# Patient Record
Sex: Male | Born: 1935
Health system: Southern US, Community
[De-identification: ages and names within clinical notes are randomized; demographics above are authoritative.]

## PROBLEM LIST (undated history)

## (undated) DIAGNOSIS — N400 Enlarged prostate without lower urinary tract symptoms: Secondary | ICD-10-CM

## (undated) DIAGNOSIS — I700 Atherosclerosis of aorta: Secondary | ICD-10-CM

## (undated) DIAGNOSIS — Q433 Congenital malformations of intestinal fixation: Secondary | ICD-10-CM

## (undated) DIAGNOSIS — K4020 Bilateral inguinal hernia, without obstruction or gangrene, not specified as recurrent: Secondary | ICD-10-CM

## (undated) DIAGNOSIS — I219 Acute myocardial infarction, unspecified: Secondary | ICD-10-CM

## (undated) DIAGNOSIS — I4821 Permanent atrial fibrillation: Secondary | ICD-10-CM

## (undated) DIAGNOSIS — I10 Essential (primary) hypertension: Secondary | ICD-10-CM

## (undated) DIAGNOSIS — Z7901 Long term (current) use of anticoagulants: Secondary | ICD-10-CM

## (undated) DIAGNOSIS — K5730 Diverticulosis of large intestine without perforation or abscess without bleeding: Secondary | ICD-10-CM

## (undated) DIAGNOSIS — Z9841 Cataract extraction status, right eye: Secondary | ICD-10-CM

## (undated) DIAGNOSIS — G709 Myoneural disorder, unspecified: Secondary | ICD-10-CM

## (undated) DIAGNOSIS — E079 Disorder of thyroid, unspecified: Secondary | ICD-10-CM

## (undated) DIAGNOSIS — K8020 Calculus of gallbladder without cholecystitis without obstruction: Secondary | ICD-10-CM

## (undated) DIAGNOSIS — D494 Neoplasm of unspecified behavior of bladder: Secondary | ICD-10-CM

## (undated) DIAGNOSIS — I2510 Atherosclerotic heart disease of native coronary artery without angina pectoris: Secondary | ICD-10-CM

## (undated) DIAGNOSIS — R7303 Prediabetes: Secondary | ICD-10-CM

## (undated) DIAGNOSIS — Z9842 Cataract extraction status, left eye: Secondary | ICD-10-CM

## (undated) DIAGNOSIS — I517 Cardiomegaly: Secondary | ICD-10-CM

## (undated) DIAGNOSIS — E039 Hypothyroidism, unspecified: Secondary | ICD-10-CM

## (undated) DIAGNOSIS — R600 Localized edema: Secondary | ICD-10-CM

## (undated) DIAGNOSIS — E785 Hyperlipidemia, unspecified: Secondary | ICD-10-CM

## (undated) DIAGNOSIS — I4891 Unspecified atrial fibrillation: Secondary | ICD-10-CM

## (undated) HISTORY — DX: Disorder of thyroid, unspecified: E07.9

## (undated) HISTORY — PX: HERNIA REPAIR: SHX51

## (undated) HISTORY — PX: CATARACT EXTRACTION, BILATERAL: SHX1313

---

## 1993-03-16 HISTORY — PX: CORONARY ARTERY BYPASS GRAFT: SHX141

## 1993-03-16 HISTORY — PX: CARDIAC SURGERY: SHX584

## 1994-06-18 DIAGNOSIS — I2109 ST elevation (STEMI) myocardial infarction involving other coronary artery of anterior wall: Secondary | ICD-10-CM

## 1994-06-18 HISTORY — DX: ST elevation (STEMI) myocardial infarction involving other coronary artery of anterior wall: I21.09

## 1994-06-25 DIAGNOSIS — Z951 Presence of aortocoronary bypass graft: Secondary | ICD-10-CM

## 1994-06-25 HISTORY — DX: Presence of aortocoronary bypass graft: Z95.1

## 1994-06-25 HISTORY — PX: CORONARY ARTERY BYPASS GRAFT: SHX141

## 2004-01-16 ENCOUNTER — Ambulatory Visit: Payer: Self-pay | Admitting: Unknown Physician Specialty

## 2004-01-26 ENCOUNTER — Inpatient Hospital Stay: Payer: Self-pay | Admitting: Internal Medicine

## 2004-01-26 ENCOUNTER — Other Ambulatory Visit: Payer: Self-pay

## 2007-07-26 DIAGNOSIS — C4492 Squamous cell carcinoma of skin, unspecified: Secondary | ICD-10-CM

## 2007-07-26 HISTORY — DX: Squamous cell carcinoma of skin, unspecified: C44.92

## 2008-04-18 ENCOUNTER — Ambulatory Visit: Payer: Self-pay | Admitting: Ophthalmology

## 2008-05-01 ENCOUNTER — Ambulatory Visit: Payer: Self-pay | Admitting: Ophthalmology

## 2008-06-07 ENCOUNTER — Ambulatory Visit: Payer: Self-pay | Admitting: Ophthalmology

## 2008-06-19 ENCOUNTER — Ambulatory Visit: Payer: Self-pay | Admitting: Ophthalmology

## 2011-04-06 ENCOUNTER — Ambulatory Visit: Payer: Self-pay | Admitting: Urology

## 2013-09-09 DIAGNOSIS — I2510 Atherosclerotic heart disease of native coronary artery without angina pectoris: Secondary | ICD-10-CM | POA: Insufficient documentation

## 2013-09-09 DIAGNOSIS — I10 Essential (primary) hypertension: Secondary | ICD-10-CM | POA: Insufficient documentation

## 2013-09-09 DIAGNOSIS — E039 Hypothyroidism, unspecified: Secondary | ICD-10-CM | POA: Insufficient documentation

## 2013-09-09 DIAGNOSIS — I482 Chronic atrial fibrillation, unspecified: Secondary | ICD-10-CM | POA: Insufficient documentation

## 2013-10-17 DIAGNOSIS — E785 Hyperlipidemia, unspecified: Secondary | ICD-10-CM | POA: Insufficient documentation

## 2014-01-04 IMAGING — US US RENAL KIDNEY
1 series · 17 of 25 positions shown · non-contrast
Comparison: none

REASON FOR EXAM: hematuria
COMMENTS:

PROCEDURE:     US  - US KIDNEY  - April 06, 2011  [DATE]
RESULT:

[Series 1: us renal kidney · 17 of 33 slices shown]
[im 1/33]
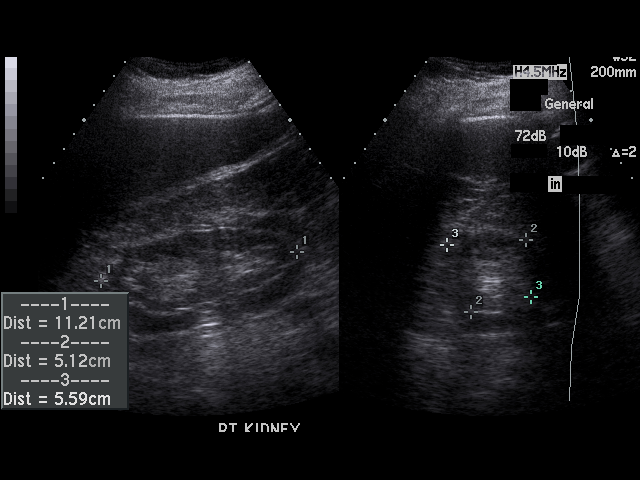
[im 3/33]
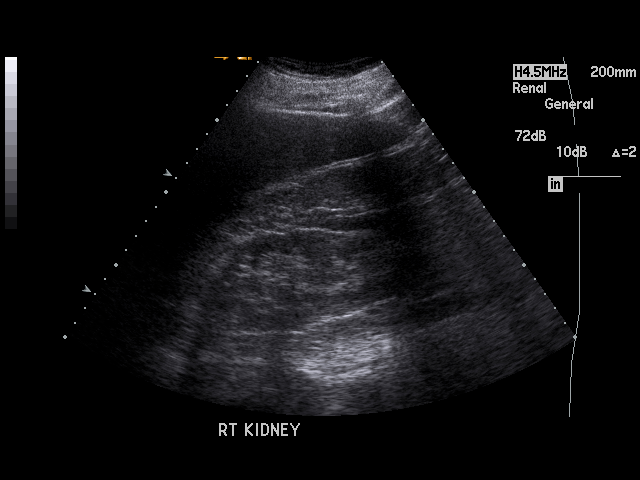
[im 5/33]
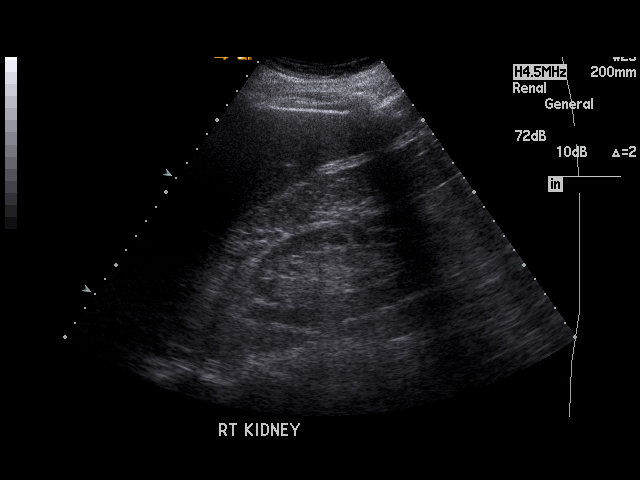
[im 7/33]
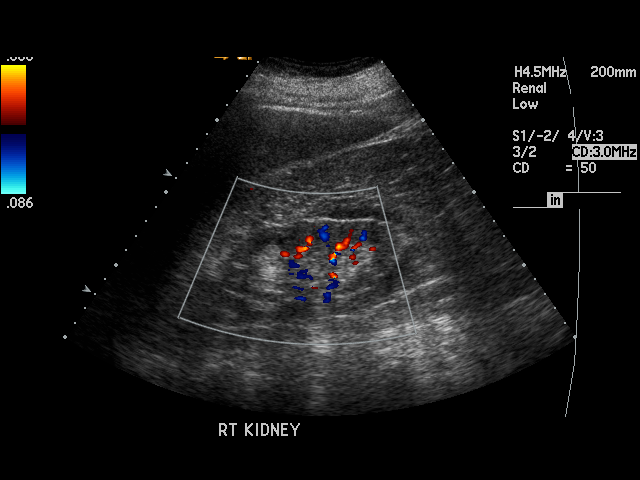
[im 9/33]
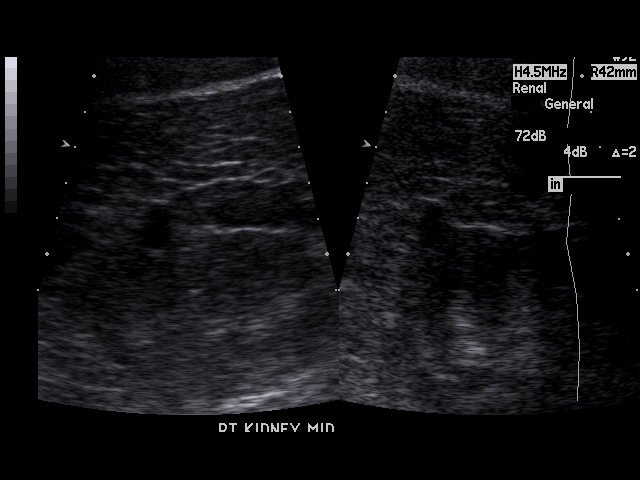
[im 11/33]
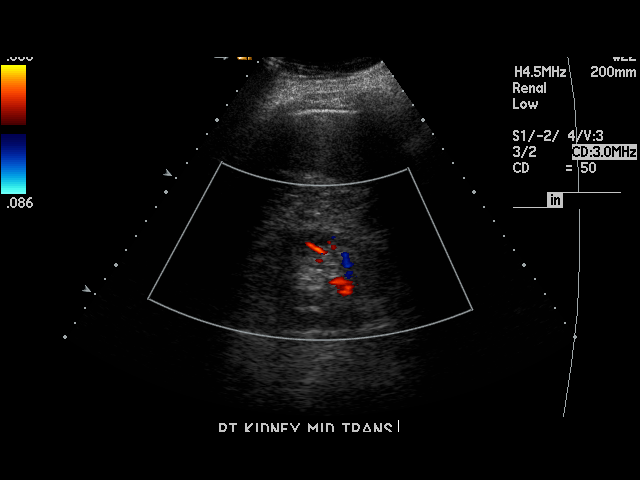
[im 13/33]
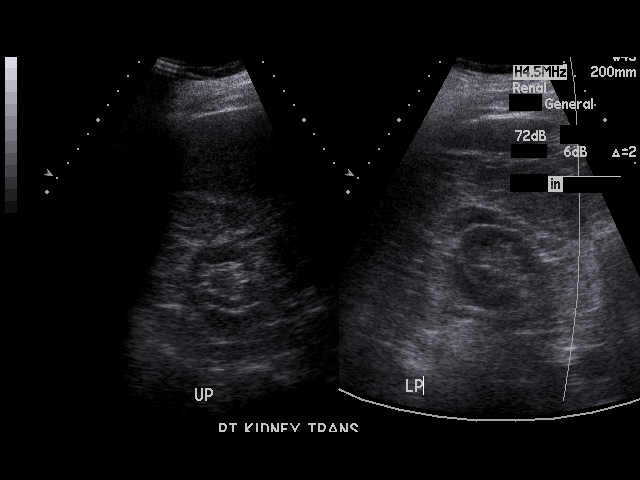
[im 15/33]
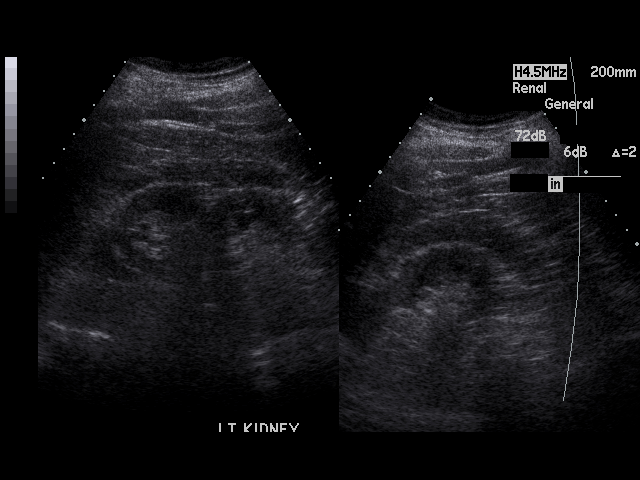
[im 17/33]
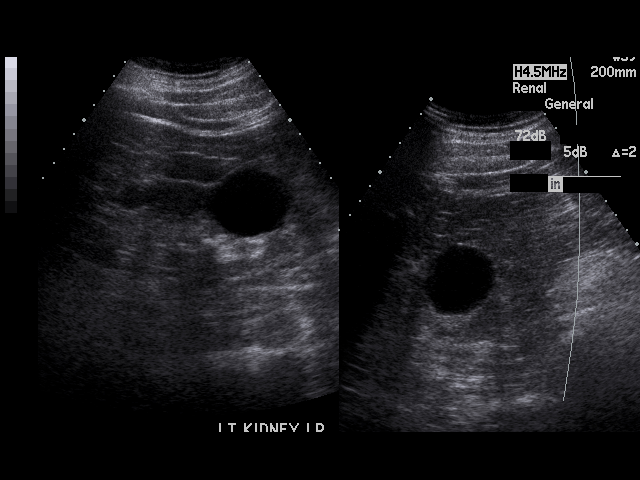
[im 18/33]
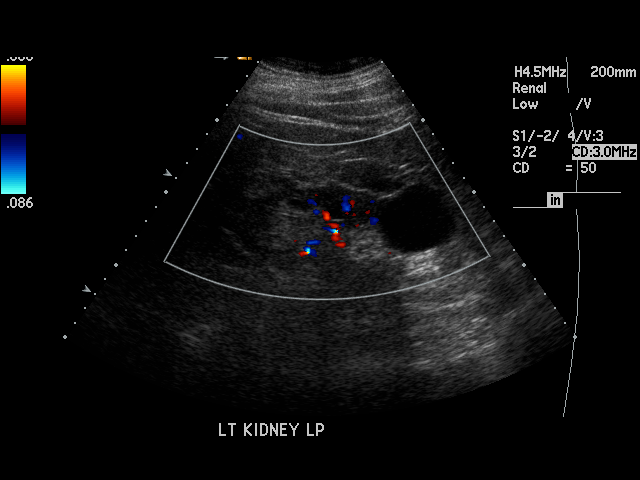
[im 21/33]
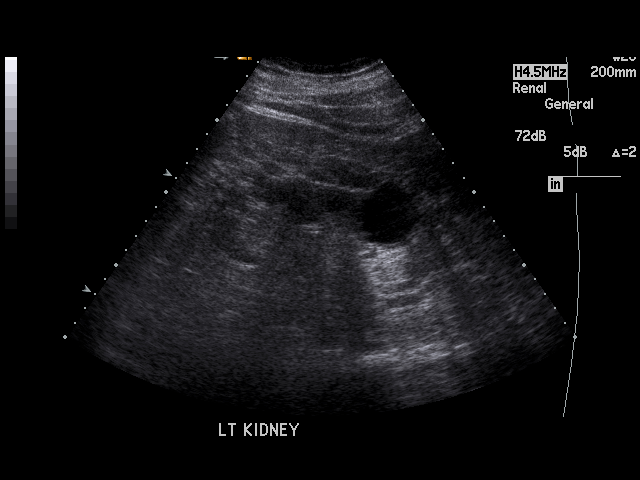
[im 22/33]
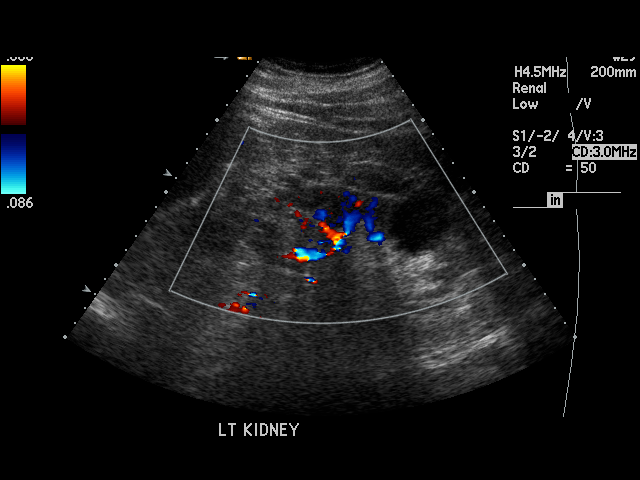
[im 25/33]
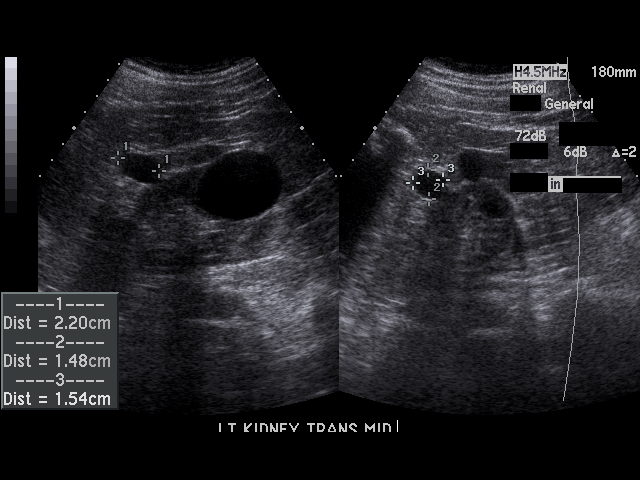
[im 26/33]
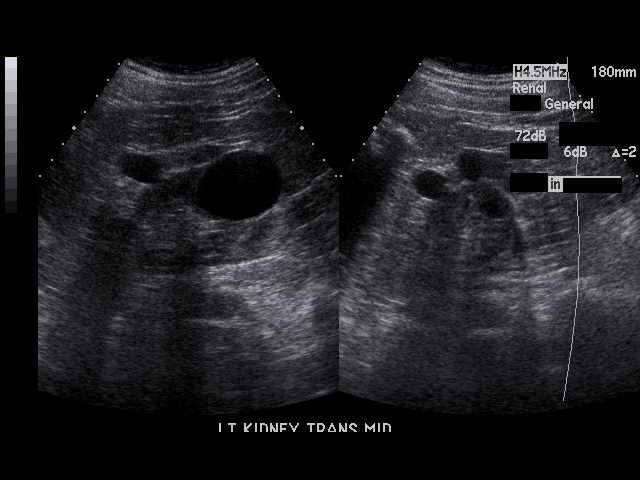
[im 29/33]
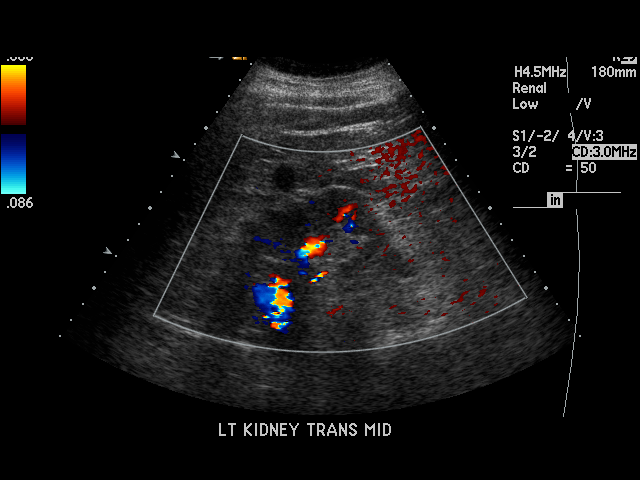
[im 30/33]
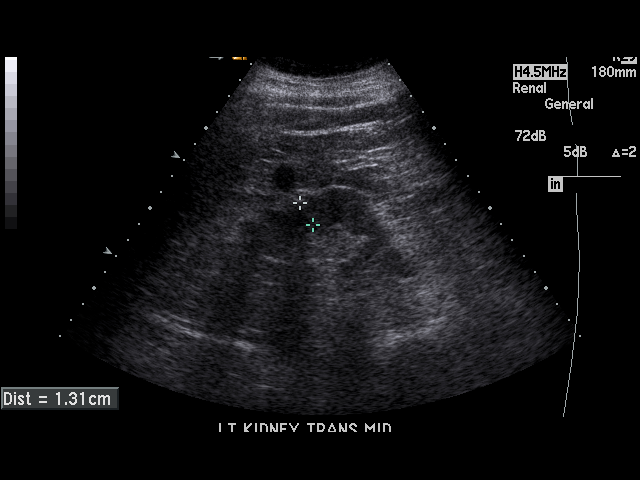
[im 33/33]
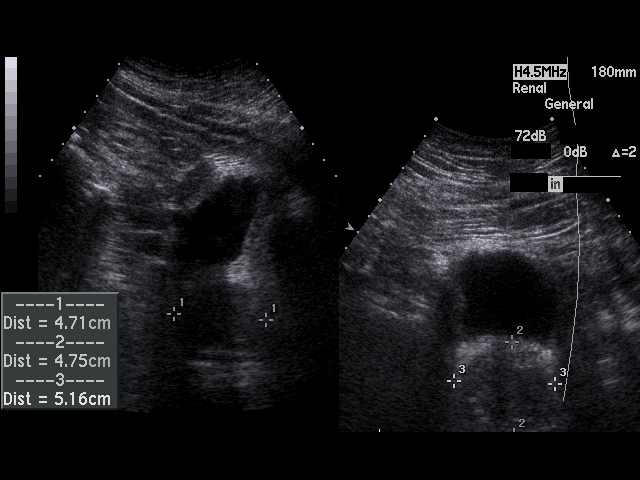

[17 of 25 positions shown; findings below may reference images not displayed]

FINDINGS: The right kidney measures 11.21 x 5.12 x 5.59 cm and the left is
11.03 x 5.89 x 4.35 cm. There is appropriate corticomedullary
differentiation and bilateral mild to moderate cortical thinning. A small
cyst is identified within the mid pole of the right kidney. Cysts are also
identified within the left kidney with the largest measuring 4.47 x 4.02 cm
in the lower pole. There is no evidence of hydronephrosis or solid appearing
masses or calculi. The urinary bladder is partially decompressed. The
prostate is prominent measuring 4.71 x 4.75 x 5.16 cm causing mass effect
upon the base of the bladder.
IMPRESSION: 1.  Bilateral cortical thinning as described above.
2.  Bilateral renal cysts.
3.  No further sonographic abnormalities.

## 2015-03-19 DIAGNOSIS — Z7901 Long term (current) use of anticoagulants: Secondary | ICD-10-CM | POA: Diagnosis not present

## 2015-03-27 DIAGNOSIS — M25572 Pain in left ankle and joints of left foot: Secondary | ICD-10-CM | POA: Diagnosis not present

## 2015-05-09 DIAGNOSIS — Z7901 Long term (current) use of anticoagulants: Secondary | ICD-10-CM | POA: Diagnosis not present

## 2015-05-18 DIAGNOSIS — J111 Influenza due to unidentified influenza virus with other respiratory manifestations: Secondary | ICD-10-CM | POA: Diagnosis not present

## 2015-05-31 DIAGNOSIS — E782 Mixed hyperlipidemia: Secondary | ICD-10-CM | POA: Diagnosis not present

## 2015-05-31 DIAGNOSIS — I4891 Unspecified atrial fibrillation: Secondary | ICD-10-CM | POA: Diagnosis not present

## 2015-05-31 DIAGNOSIS — N183 Chronic kidney disease, stage 3 (moderate): Secondary | ICD-10-CM | POA: Diagnosis not present

## 2015-05-31 DIAGNOSIS — I2510 Atherosclerotic heart disease of native coronary artery without angina pectoris: Secondary | ICD-10-CM | POA: Diagnosis not present

## 2015-05-31 DIAGNOSIS — I482 Chronic atrial fibrillation: Secondary | ICD-10-CM | POA: Diagnosis not present

## 2015-05-31 DIAGNOSIS — I10 Essential (primary) hypertension: Secondary | ICD-10-CM | POA: Diagnosis not present

## 2015-06-05 DIAGNOSIS — M25572 Pain in left ankle and joints of left foot: Secondary | ICD-10-CM | POA: Diagnosis not present

## 2015-06-06 DIAGNOSIS — Z7901 Long term (current) use of anticoagulants: Secondary | ICD-10-CM | POA: Diagnosis not present

## 2015-07-08 DIAGNOSIS — Z7901 Long term (current) use of anticoagulants: Secondary | ICD-10-CM | POA: Diagnosis not present

## 2015-07-25 DIAGNOSIS — L509 Urticaria, unspecified: Secondary | ICD-10-CM | POA: Diagnosis not present

## 2015-07-25 DIAGNOSIS — L570 Actinic keratosis: Secondary | ICD-10-CM | POA: Diagnosis not present

## 2015-07-25 DIAGNOSIS — T07 Unspecified multiple injuries: Secondary | ICD-10-CM | POA: Diagnosis not present

## 2015-07-25 DIAGNOSIS — L820 Inflamed seborrheic keratosis: Secondary | ICD-10-CM | POA: Diagnosis not present

## 2015-07-25 DIAGNOSIS — L578 Other skin changes due to chronic exposure to nonionizing radiation: Secondary | ICD-10-CM | POA: Diagnosis not present

## 2015-08-05 DIAGNOSIS — Z7901 Long term (current) use of anticoagulants: Secondary | ICD-10-CM | POA: Diagnosis not present

## 2015-09-04 DIAGNOSIS — Z7901 Long term (current) use of anticoagulants: Secondary | ICD-10-CM | POA: Diagnosis not present

## 2015-09-04 DIAGNOSIS — I10 Essential (primary) hypertension: Secondary | ICD-10-CM | POA: Diagnosis not present

## 2015-09-04 DIAGNOSIS — E039 Hypothyroidism, unspecified: Secondary | ICD-10-CM | POA: Diagnosis not present

## 2015-09-04 DIAGNOSIS — I2510 Atherosclerotic heart disease of native coronary artery without angina pectoris: Secondary | ICD-10-CM | POA: Diagnosis not present

## 2015-09-11 DIAGNOSIS — I2510 Atherosclerotic heart disease of native coronary artery without angina pectoris: Secondary | ICD-10-CM | POA: Diagnosis not present

## 2015-09-11 DIAGNOSIS — I482 Chronic atrial fibrillation: Secondary | ICD-10-CM | POA: Diagnosis not present

## 2015-09-11 DIAGNOSIS — E039 Hypothyroidism, unspecified: Secondary | ICD-10-CM | POA: Diagnosis not present

## 2015-09-11 DIAGNOSIS — I10 Essential (primary) hypertension: Secondary | ICD-10-CM | POA: Diagnosis not present

## 2015-09-11 DIAGNOSIS — N183 Chronic kidney disease, stage 3 (moderate): Secondary | ICD-10-CM | POA: Diagnosis not present

## 2015-10-02 DIAGNOSIS — Z7901 Long term (current) use of anticoagulants: Secondary | ICD-10-CM | POA: Diagnosis not present

## 2015-10-30 DIAGNOSIS — Z7901 Long term (current) use of anticoagulants: Secondary | ICD-10-CM | POA: Diagnosis not present

## 2015-11-26 DIAGNOSIS — Z23 Encounter for immunization: Secondary | ICD-10-CM | POA: Diagnosis not present

## 2015-12-03 DIAGNOSIS — I10 Essential (primary) hypertension: Secondary | ICD-10-CM | POA: Diagnosis not present

## 2015-12-03 DIAGNOSIS — I2510 Atherosclerotic heart disease of native coronary artery without angina pectoris: Secondary | ICD-10-CM | POA: Diagnosis not present

## 2015-12-03 DIAGNOSIS — E782 Mixed hyperlipidemia: Secondary | ICD-10-CM | POA: Diagnosis not present

## 2015-12-03 DIAGNOSIS — I482 Chronic atrial fibrillation: Secondary | ICD-10-CM | POA: Diagnosis not present

## 2015-12-03 DIAGNOSIS — Z7901 Long term (current) use of anticoagulants: Secondary | ICD-10-CM | POA: Diagnosis not present

## 2015-12-31 DIAGNOSIS — Z7901 Long term (current) use of anticoagulants: Secondary | ICD-10-CM | POA: Diagnosis not present

## 2016-01-05 DIAGNOSIS — S99921A Unspecified injury of right foot, initial encounter: Secondary | ICD-10-CM | POA: Diagnosis not present

## 2016-01-28 DIAGNOSIS — Z7901 Long term (current) use of anticoagulants: Secondary | ICD-10-CM | POA: Diagnosis not present

## 2016-02-10 DIAGNOSIS — D239 Other benign neoplasm of skin, unspecified: Secondary | ICD-10-CM

## 2016-02-10 DIAGNOSIS — L720 Epidermal cyst: Secondary | ICD-10-CM | POA: Diagnosis not present

## 2016-02-10 DIAGNOSIS — Z1283 Encounter for screening for malignant neoplasm of skin: Secondary | ICD-10-CM | POA: Diagnosis not present

## 2016-02-10 DIAGNOSIS — L821 Other seborrheic keratosis: Secondary | ICD-10-CM | POA: Diagnosis not present

## 2016-02-10 DIAGNOSIS — Z85828 Personal history of other malignant neoplasm of skin: Secondary | ICD-10-CM | POA: Diagnosis not present

## 2016-02-10 DIAGNOSIS — D229 Melanocytic nevi, unspecified: Secondary | ICD-10-CM | POA: Diagnosis not present

## 2016-02-10 DIAGNOSIS — L578 Other skin changes due to chronic exposure to nonionizing radiation: Secondary | ICD-10-CM | POA: Diagnosis not present

## 2016-02-10 DIAGNOSIS — D1800 Hemangioma unspecified site: Secondary | ICD-10-CM | POA: Diagnosis not present

## 2016-02-10 DIAGNOSIS — D692 Other nonthrombocytopenic purpura: Secondary | ICD-10-CM | POA: Diagnosis not present

## 2016-02-10 DIAGNOSIS — D485 Neoplasm of uncertain behavior of skin: Secondary | ICD-10-CM | POA: Diagnosis not present

## 2016-02-10 DIAGNOSIS — L570 Actinic keratosis: Secondary | ICD-10-CM | POA: Diagnosis not present

## 2016-02-10 DIAGNOSIS — D225 Melanocytic nevi of trunk: Secondary | ICD-10-CM | POA: Diagnosis not present

## 2016-02-10 HISTORY — DX: Other benign neoplasm of skin, unspecified: D23.9

## 2016-02-25 DIAGNOSIS — D485 Neoplasm of uncertain behavior of skin: Secondary | ICD-10-CM | POA: Diagnosis not present

## 2016-02-25 DIAGNOSIS — L089 Local infection of the skin and subcutaneous tissue, unspecified: Secondary | ICD-10-CM | POA: Diagnosis not present

## 2016-03-11 DIAGNOSIS — I10 Essential (primary) hypertension: Secondary | ICD-10-CM | POA: Diagnosis not present

## 2016-03-11 DIAGNOSIS — Z7901 Long term (current) use of anticoagulants: Secondary | ICD-10-CM | POA: Diagnosis not present

## 2016-03-11 DIAGNOSIS — I2510 Atherosclerotic heart disease of native coronary artery without angina pectoris: Secondary | ICD-10-CM | POA: Diagnosis not present

## 2016-03-11 DIAGNOSIS — E039 Hypothyroidism, unspecified: Secondary | ICD-10-CM | POA: Diagnosis not present

## 2016-03-17 DIAGNOSIS — Z0000 Encounter for general adult medical examination without abnormal findings: Secondary | ICD-10-CM | POA: Diagnosis not present

## 2016-03-17 DIAGNOSIS — I10 Essential (primary) hypertension: Secondary | ICD-10-CM | POA: Diagnosis not present

## 2016-03-17 DIAGNOSIS — I2510 Atherosclerotic heart disease of native coronary artery without angina pectoris: Secondary | ICD-10-CM | POA: Diagnosis not present

## 2016-03-17 DIAGNOSIS — E782 Mixed hyperlipidemia: Secondary | ICD-10-CM | POA: Diagnosis not present

## 2016-03-17 DIAGNOSIS — N183 Chronic kidney disease, stage 3 (moderate): Secondary | ICD-10-CM | POA: Diagnosis not present

## 2016-03-17 DIAGNOSIS — I482 Chronic atrial fibrillation: Secondary | ICD-10-CM | POA: Diagnosis not present

## 2016-03-17 DIAGNOSIS — E039 Hypothyroidism, unspecified: Secondary | ICD-10-CM | POA: Diagnosis not present

## 2016-04-16 DIAGNOSIS — Z7901 Long term (current) use of anticoagulants: Secondary | ICD-10-CM | POA: Diagnosis not present

## 2016-05-13 DIAGNOSIS — Z7901 Long term (current) use of anticoagulants: Secondary | ICD-10-CM | POA: Diagnosis not present

## 2016-05-21 DIAGNOSIS — E782 Mixed hyperlipidemia: Secondary | ICD-10-CM | POA: Diagnosis not present

## 2016-05-21 DIAGNOSIS — N183 Chronic kidney disease, stage 3 (moderate): Secondary | ICD-10-CM | POA: Diagnosis not present

## 2016-05-21 DIAGNOSIS — I482 Chronic atrial fibrillation: Secondary | ICD-10-CM | POA: Diagnosis not present

## 2016-05-21 DIAGNOSIS — I10 Essential (primary) hypertension: Secondary | ICD-10-CM | POA: Diagnosis not present

## 2016-05-21 DIAGNOSIS — I2510 Atherosclerotic heart disease of native coronary artery without angina pectoris: Secondary | ICD-10-CM | POA: Diagnosis not present

## 2016-06-09 DIAGNOSIS — Z7901 Long term (current) use of anticoagulants: Secondary | ICD-10-CM | POA: Diagnosis not present

## 2016-07-08 DIAGNOSIS — Z7901 Long term (current) use of anticoagulants: Secondary | ICD-10-CM | POA: Diagnosis not present

## 2016-08-05 DIAGNOSIS — Z7901 Long term (current) use of anticoagulants: Secondary | ICD-10-CM | POA: Diagnosis not present

## 2016-08-13 DIAGNOSIS — L820 Inflamed seborrheic keratosis: Secondary | ICD-10-CM | POA: Diagnosis not present

## 2016-08-13 DIAGNOSIS — D1800 Hemangioma unspecified site: Secondary | ICD-10-CM | POA: Diagnosis not present

## 2016-08-13 DIAGNOSIS — Z1283 Encounter for screening for malignant neoplasm of skin: Secondary | ICD-10-CM | POA: Diagnosis not present

## 2016-08-13 DIAGNOSIS — D485 Neoplasm of uncertain behavior of skin: Secondary | ICD-10-CM | POA: Diagnosis not present

## 2016-08-13 DIAGNOSIS — Z85828 Personal history of other malignant neoplasm of skin: Secondary | ICD-10-CM | POA: Diagnosis not present

## 2016-08-13 DIAGNOSIS — L821 Other seborrheic keratosis: Secondary | ICD-10-CM | POA: Diagnosis not present

## 2016-08-13 DIAGNOSIS — L578 Other skin changes due to chronic exposure to nonionizing radiation: Secondary | ICD-10-CM | POA: Diagnosis not present

## 2016-08-13 DIAGNOSIS — L570 Actinic keratosis: Secondary | ICD-10-CM | POA: Diagnosis not present

## 2016-08-13 DIAGNOSIS — D692 Other nonthrombocytopenic purpura: Secondary | ICD-10-CM | POA: Diagnosis not present

## 2016-09-10 DIAGNOSIS — E039 Hypothyroidism, unspecified: Secondary | ICD-10-CM | POA: Diagnosis not present

## 2016-09-10 DIAGNOSIS — I2510 Atherosclerotic heart disease of native coronary artery without angina pectoris: Secondary | ICD-10-CM | POA: Diagnosis not present

## 2016-09-10 DIAGNOSIS — I10 Essential (primary) hypertension: Secondary | ICD-10-CM | POA: Diagnosis not present

## 2016-09-10 DIAGNOSIS — I482 Chronic atrial fibrillation: Secondary | ICD-10-CM | POA: Diagnosis not present

## 2016-09-10 DIAGNOSIS — Z7901 Long term (current) use of anticoagulants: Secondary | ICD-10-CM | POA: Diagnosis not present

## 2016-09-17 DIAGNOSIS — E039 Hypothyroidism, unspecified: Secondary | ICD-10-CM | POA: Diagnosis not present

## 2016-09-17 DIAGNOSIS — N183 Chronic kidney disease, stage 3 (moderate): Secondary | ICD-10-CM | POA: Diagnosis not present

## 2016-09-17 DIAGNOSIS — E782 Mixed hyperlipidemia: Secondary | ICD-10-CM | POA: Diagnosis not present

## 2016-09-17 DIAGNOSIS — I2510 Atherosclerotic heart disease of native coronary artery without angina pectoris: Secondary | ICD-10-CM | POA: Diagnosis not present

## 2016-09-17 DIAGNOSIS — I482 Chronic atrial fibrillation: Secondary | ICD-10-CM | POA: Diagnosis not present

## 2016-09-17 DIAGNOSIS — I129 Hypertensive chronic kidney disease with stage 1 through stage 4 chronic kidney disease, or unspecified chronic kidney disease: Secondary | ICD-10-CM | POA: Diagnosis not present

## 2016-10-07 DIAGNOSIS — Z7901 Long term (current) use of anticoagulants: Secondary | ICD-10-CM | POA: Diagnosis not present

## 2016-11-04 DIAGNOSIS — Z7901 Long term (current) use of anticoagulants: Secondary | ICD-10-CM | POA: Diagnosis not present

## 2016-12-01 DIAGNOSIS — I4891 Unspecified atrial fibrillation: Secondary | ICD-10-CM | POA: Diagnosis not present

## 2016-12-01 DIAGNOSIS — I2510 Atherosclerotic heart disease of native coronary artery without angina pectoris: Secondary | ICD-10-CM | POA: Diagnosis not present

## 2016-12-01 DIAGNOSIS — N183 Chronic kidney disease, stage 3 (moderate): Secondary | ICD-10-CM | POA: Diagnosis not present

## 2016-12-01 DIAGNOSIS — Z7901 Long term (current) use of anticoagulants: Secondary | ICD-10-CM | POA: Diagnosis not present

## 2016-12-01 DIAGNOSIS — E782 Mixed hyperlipidemia: Secondary | ICD-10-CM | POA: Diagnosis not present

## 2016-12-01 DIAGNOSIS — I482 Chronic atrial fibrillation: Secondary | ICD-10-CM | POA: Diagnosis not present

## 2016-12-01 DIAGNOSIS — I129 Hypertensive chronic kidney disease with stage 1 through stage 4 chronic kidney disease, or unspecified chronic kidney disease: Secondary | ICD-10-CM | POA: Diagnosis not present

## 2016-12-07 DIAGNOSIS — Z23 Encounter for immunization: Secondary | ICD-10-CM | POA: Diagnosis not present

## 2016-12-14 DIAGNOSIS — I129 Hypertensive chronic kidney disease with stage 1 through stage 4 chronic kidney disease, or unspecified chronic kidney disease: Secondary | ICD-10-CM | POA: Diagnosis not present

## 2016-12-14 DIAGNOSIS — J069 Acute upper respiratory infection, unspecified: Secondary | ICD-10-CM | POA: Diagnosis not present

## 2016-12-14 DIAGNOSIS — J40 Bronchitis, not specified as acute or chronic: Secondary | ICD-10-CM | POA: Diagnosis not present

## 2016-12-14 DIAGNOSIS — I482 Chronic atrial fibrillation: Secondary | ICD-10-CM | POA: Diagnosis not present

## 2016-12-14 DIAGNOSIS — N183 Chronic kidney disease, stage 3 (moderate): Secondary | ICD-10-CM | POA: Diagnosis not present

## 2016-12-17 DIAGNOSIS — I482 Chronic atrial fibrillation: Secondary | ICD-10-CM | POA: Diagnosis not present

## 2016-12-17 DIAGNOSIS — J40 Bronchitis, not specified as acute or chronic: Secondary | ICD-10-CM | POA: Diagnosis not present

## 2016-12-17 DIAGNOSIS — N183 Chronic kidney disease, stage 3 (moderate): Secondary | ICD-10-CM | POA: Diagnosis not present

## 2016-12-17 DIAGNOSIS — I129 Hypertensive chronic kidney disease with stage 1 through stage 4 chronic kidney disease, or unspecified chronic kidney disease: Secondary | ICD-10-CM | POA: Diagnosis not present

## 2016-12-17 DIAGNOSIS — R05 Cough: Secondary | ICD-10-CM | POA: Diagnosis not present

## 2016-12-30 DIAGNOSIS — Z0189 Encounter for other specified special examinations: Secondary | ICD-10-CM | POA: Diagnosis not present

## 2017-02-01 DIAGNOSIS — Z7901 Long term (current) use of anticoagulants: Secondary | ICD-10-CM | POA: Diagnosis not present

## 2017-02-20 ENCOUNTER — Emergency Department: Payer: PPO

## 2017-02-20 ENCOUNTER — Other Ambulatory Visit: Payer: Self-pay

## 2017-02-20 ENCOUNTER — Emergency Department
Admission: EM | Admit: 2017-02-20 | Discharge: 2017-02-20 | Disposition: A | Discharge status: Home / Self Care | Payer: PPO | Attending: Emergency Medicine | Admitting: Emergency Medicine

## 2017-02-20 ENCOUNTER — Encounter: Payer: Self-pay | Admitting: Emergency Medicine

## 2017-02-20 DIAGNOSIS — R600 Localized edema: Secondary | ICD-10-CM | POA: Diagnosis not present

## 2017-02-20 DIAGNOSIS — I110 Hypertensive heart disease with heart failure: Secondary | ICD-10-CM | POA: Diagnosis not present

## 2017-02-20 DIAGNOSIS — I2510 Atherosclerotic heart disease of native coronary artery without angina pectoris: Secondary | ICD-10-CM | POA: Diagnosis not present

## 2017-02-20 DIAGNOSIS — Z951 Presence of aortocoronary bypass graft: Secondary | ICD-10-CM | POA: Insufficient documentation

## 2017-02-20 DIAGNOSIS — R0602 Shortness of breath: Secondary | ICD-10-CM | POA: Diagnosis not present

## 2017-02-20 DIAGNOSIS — Z7901 Long term (current) use of anticoagulants: Secondary | ICD-10-CM | POA: Insufficient documentation

## 2017-02-20 DIAGNOSIS — Z79899 Other long term (current) drug therapy: Secondary | ICD-10-CM | POA: Diagnosis not present

## 2017-02-20 DIAGNOSIS — I509 Heart failure, unspecified: Secondary | ICD-10-CM | POA: Insufficient documentation

## 2017-02-20 DIAGNOSIS — R2243 Localized swelling, mass and lump, lower limb, bilateral: Secondary | ICD-10-CM | POA: Diagnosis present

## 2017-02-20 HISTORY — DX: Essential (primary) hypertension: I10

## 2017-02-20 HISTORY — DX: Atherosclerotic heart disease of native coronary artery without angina pectoris: I25.10

## 2017-02-20 LAB — BASIC METABOLIC PANEL
Anion gap: 7 (ref 5–15)
BUN: 18 mg/dL (ref 6–20)
CALCIUM: 9.1 mg/dL (ref 8.9–10.3)
CO2: 27 mmol/L (ref 22–32)
CREATININE: 0.86 mg/dL (ref 0.61–1.24)
Chloride: 101 mmol/L (ref 101–111)
Glucose, Bld: 105 mg/dL — ABNORMAL HIGH (ref 65–99)
Potassium: 3.9 mmol/L (ref 3.5–5.1)
SODIUM: 135 mmol/L (ref 135–145)

## 2017-02-20 LAB — CBC
HCT: 45.5 % (ref 40.0–52.0)
Hemoglobin: 15.2 g/dL (ref 13.0–18.0)
MCH: 31.6 pg (ref 26.0–34.0)
MCHC: 33.5 g/dL (ref 32.0–36.0)
MCV: 94.5 fL (ref 80.0–100.0)
PLATELETS: 219 10*3/uL (ref 150–440)
RBC: 4.82 MIL/uL (ref 4.40–5.90)
RDW: 14.4 % (ref 11.5–14.5)
WBC: 7.2 10*3/uL (ref 3.8–10.6)

## 2017-02-20 LAB — TROPONIN I

## 2017-02-20 MED ORDER — FUROSEMIDE 10 MG/ML IJ SOLN
60.0000 mg | Freq: Once | INTRAMUSCULAR | Status: AC
  Administered 2017-02-20: 60 mg via INTRAVENOUS
  Filled 2017-02-20: qty 8

## 2017-02-20 MED ORDER — FUROSEMIDE 20 MG PO TABS: 20.0000 mg | ORAL_TABLET | Freq: Every day | ORAL | 0 refills | Status: DC

## 2017-02-20 NOTE — Discharge Instructions (Signed)
Please call Dr. Ubaldo Glassing office this week for an appointment this week for reevaluation and possible echocardiogram/ultrasound of your heart.  Return to the emergency department for any trouble breathing or any chest pain.

## 2017-02-20 NOTE — ED Notes (Signed)
Pt states he ate seafood 3 days ago which caused him to retain fluid. He increased his medicine for the last two days and states he does feel better, his legs less tight and he can now get his ring on. Son at bedside. Pt is in nad. bp high - pt states it was too low so they had taken him off his spironolactone

## 2017-02-20 NOTE — ED Triage Notes (Signed)
Arrives with multiple complaints.  Describes generalized swelling x 2-3 days -- thought it might be from eating seafood a few days ago.  Describes intermittently feeling SOB over the past couple of days.  Denies Chest Pain.

## 2017-02-20 NOTE — ED Provider Notes (Signed)
[facility] Emergency Department Provider Note  Time seen: 2:50 PM  I have reviewed the triage vital signs and the nursing notes.   HISTORY  Chief Complaint Shortness of Breath    HPI Carl Coffey is a 83 y.o. male with a past medical history of CAD, hypertension, CABG 23 years ago, presents to the emergency department for peripheral edema.  According to the patient he states he ate seafood Tuesday night and then once again Wednesday, he thought the swelling could be due to the high level of salt or possibly due to a reaction.  Patient states over the past 3-4 days he has been experiencing lower extremity edema and he believes some edema around his abdomen as well.  He states mild shortness of breath especially with exertion but denies any shortness of breath when lying or resting.  Denies any chest pain at any point.  Patient states he had some leftover Spironolactone which he started taking yesterday and has lost approximately 4 pounds urinating since yesterday.   Past Medical History:  Diagnosis Date  . Coronary artery disease   . Hypertension     There are no active problems to display for this patient.   Past Surgical History:  Procedure Laterality Date  . CARDIAC SURGERY  1995   CABG x 3  . HERNIA REPAIR      Prior to Admission medications   Medication Sig Start Date End Date Taking? Authorizing Provider  finasteride (PROSCAR) 5 MG tablet Take 1 tablet by mouth daily. 02/19/17   [provider]  levothyroxine (SYNTHROID, LEVOTHROID) 75 MCG tablet Take 1 tablet by mouth every morning. 12/04/16   [provider]  metoprolol succinate (TOPROL-XL) 25 MG 24 hr tablet Take 1 tablet by mouth daily. 01/08/17   [provider]  simvastatin (ZOCOR) 10 MG tablet Take 1 tablet by mouth daily. 12/04/16   [provider]  warfarin (COUMADIN) 2 MG tablet Take 1 tablet by mouth daily. 02/12/17   [provider]    No  Known Allergies  No family history on file.  Social History Social History   Tobacco Use  . Smoking status: Never Smoker  . Smokeless tobacco: Never Used  Substance Use Topics  . Alcohol use: Not on file  . Drug use: Not on file    Review of Systems Constitutional: Negative for fever Cardiovascular: Negative for chest pain. Respiratory: Some shortness of breath but only with exertion per patient.  None at rest.  None currently. Gastrointestinal: Negative for abdominal pain.  Negative for nausea or vomiting.  Negative for diarrhea.  Normal bowel movement today. Musculoskeletal: Positive for lower extremity edema Neurological: Negative for headache All other ROS negative  ____________________________________________   PHYSICAL EXAM:  VITAL SIGNS: ED Triage Vitals  Enc Vitals Group     BP 02/20/17 1226 (!) 177/91     Pulse Rate 02/20/17 1226 61     Resp 02/20/17 1226 18     Temp 02/20/17 1226 97.8 F (36.6 C)     Temp Source 02/20/17 1226 Oral     SpO2 02/20/17 1226 97 %     Weight 02/20/17 1227 210 lb (95.3 kg)     Height 02/20/17 1227 5\' 10"  (1.778 m)     Head Circumference --      Peak Flow --      Pain Score 02/20/17 1230 0     Pain Loc --      Pain Edu? --  Excl. in [place]? --     Constitutional: Alert and oriented. Well appearing and in no distress. Eyes: Normal exam ENT   Head: Normocephalic and atraumatic.   Mouth/Throat: Mucous membranes are moist. Cardiovascular: Normal rate, regular rhythm. No murmur Respiratory: Normal respiratory effort without tachypnea nor retractions. Breath sounds are clear Gastrointestinal: Soft and nontender. No distention.   Musculoskeletal: Nontender with normal range of motion in all extremities.  1+ lower extremity edema equal bilaterally, calves are nontender. Neurologic:  Normal speech and language. No gross focal neurologic deficits  Skin:  Skin is warm, dry and intact.  Psychiatric: Mood and affect are  normal.  ____________________________________________    EKG  EKG reviewed and interpreted by myself shows atrial fibrillation at 60 bpm, widened QRS, left axis deviation, nonspecific ST changes.  EKG largely unchanged from 2005  ____________________________________________    RADIOLOGY  Chest x-ray shows enlargement of the cardiac silhouette with pulmonary vascular congestion possible mild pulmonary edema  ____________________________________________   INITIAL IMPRESSION / ASSESSMENT AND PLAN / ED COURSE  Pertinent labs & imaging results that were available during my care of the patient were reviewed by me and considered in my medical decision making (see chart for details).  Patient presents to the emergency department for bilateral lower extremity edema with some dyspnea on exertion over the past 3-4 days.  Differential would include peripheral edema, CHF, ACS.  Overall the patient appears extremely well, no distress.  I have reviewed the patient's records which show chronic atrial fibrillation largely unchanged from today's finding.  Findings today are suggestive of congestive heart failure although the patient's room air saturation remains between 96 and 98%.  Patient has no complaints at this time.  Denies any current dyspnea.  Patient has mild 1+ edema in his lower extremities.  I discussed the patient with Dr. Josefa Half of cardiology who recommends a dose of IV Lasix in the emergency department and discharged with 20 mg Lasix daily and will follow-up in the office with Dr. Ubaldo Glassing this week.  Patient agreeable to this plan of care.  I did discuss with the patient should he have any increased trouble breathing develop any chest pain at any point he is to return to the emergency department for repeat evaluation.  Patient agreeable to this plan.  Patient has received IV Lasix.  Patient's labs are reassuring troponin negative.  We will discharge to 20 mg of Lasix daily and have the  patient follow-up with Dr. Ubaldo Glassing this week for an echocardiogram.  Patient agreeable to plan.  Discussed strict return precautions for any trouble breathing or chest pain.  ____________________________________________   FINAL CLINICAL IMPRESSION(S) / ED DIAGNOSES  Congestive heart failure Peripheral edema    Harvest Dark, MD 02/20/17 1558

## 2017-02-20 NOTE — ED Notes (Signed)
AAOx3.  Skin warm and dry.  NAD.  D/C home with son

## 2017-02-20 NOTE — ED Notes (Addendum)
Pt from triage waiting room to room 19. Lab results back

## 2017-02-24 ENCOUNTER — Other Ambulatory Visit: Payer: Self-pay | Admitting: *Deleted

## 2017-02-24 DIAGNOSIS — E7801 Familial hypercholesterolemia: Secondary | ICD-10-CM | POA: Diagnosis not present

## 2017-02-24 DIAGNOSIS — N183 Chronic kidney disease, stage 3 (moderate): Secondary | ICD-10-CM | POA: Diagnosis not present

## 2017-02-24 DIAGNOSIS — I482 Chronic atrial fibrillation: Secondary | ICD-10-CM | POA: Diagnosis not present

## 2017-02-24 DIAGNOSIS — I129 Hypertensive chronic kidney disease with stage 1 through stage 4 chronic kidney disease, or unspecified chronic kidney disease: Secondary | ICD-10-CM | POA: Diagnosis not present

## 2017-02-24 DIAGNOSIS — I2510 Atherosclerotic heart disease of native coronary artery without angina pectoris: Secondary | ICD-10-CM | POA: Diagnosis not present

## 2017-02-24 NOTE — Patient Outreach (Signed)
[facility]) Care Management  02/24/2017  Carl Coffey [birth year] [id number]   Telephone Screen  Referral Date: 02/24/17 Referral Source: ED Census Referral Referral Reason: High ED Utilizer Insurance: HTA  Outreach attempt #1 to patient. No answer. RN CM left HIPAA compliant message along with contact info.  Plan: RN CM will contact patient within one week.  Lake Bells, RN, BSN, MHA/MSL, [place] Telephonic Care Manager Coordinator [facility] Direct Phone: [phone] Toll Free: [phone] Fax: [phone]

## 2017-02-25 ENCOUNTER — Ambulatory Visit: Payer: Self-pay | Admitting: *Deleted

## 2017-02-25 ENCOUNTER — Other Ambulatory Visit: Payer: Self-pay | Admitting: *Deleted

## 2017-02-25 NOTE — Patient Outreach (Signed)
[facility]) Care Management  02/25/2017  BUDDY LOEFFELHOLZ [birth year] [id number]   Telephone Screen  Referral Date: 02/24/17 Referral Source: ED Census Referral Referral Reason: High ED Utilizer Insurance: HTA  Outreach attempt #2 to patient.  No answer. RN CM left HIPAA compliant message along with contact info.   Plan:  RN CM will contact patient within one week.   Lake Bells, RN, BSN, MHA/MSL, [place] Telephonic Care Manager Coordinator [facility] Direct Phone: [phone] Cell Phone: [phone] Toll Free: [phone] Fax: [phone]

## 2017-02-26 ENCOUNTER — Other Ambulatory Visit: Payer: Self-pay | Admitting: *Deleted

## 2017-02-26 NOTE — Patient Outreach (Signed)
[facility]) Care Management  02/26/2017  KOTA CIANCIO [birth year] [id number]   "[facility] Care Management criteria for ED census screening calls is 6 ED visits in 6 months. This patient does not meet this criteria, case is being closed."   Lake Bells, RN, BSN, MHA/MSL, [place] Direct Phone: [phone] Cell Phone: [phone] Toll Free: [phone] Fax: [phone]

## 2017-03-01 ENCOUNTER — Ambulatory Visit: Payer: Self-pay | Admitting: *Deleted

## 2017-03-03 DIAGNOSIS — I482 Chronic atrial fibrillation: Secondary | ICD-10-CM | POA: Diagnosis not present

## 2017-03-15 DIAGNOSIS — I129 Hypertensive chronic kidney disease with stage 1 through stage 4 chronic kidney disease, or unspecified chronic kidney disease: Secondary | ICD-10-CM | POA: Diagnosis not present

## 2017-03-15 DIAGNOSIS — I2510 Atherosclerotic heart disease of native coronary artery without angina pectoris: Secondary | ICD-10-CM | POA: Diagnosis not present

## 2017-03-15 DIAGNOSIS — E782 Mixed hyperlipidemia: Secondary | ICD-10-CM | POA: Diagnosis not present

## 2017-03-15 DIAGNOSIS — N183 Chronic kidney disease, stage 3 (moderate): Secondary | ICD-10-CM | POA: Diagnosis not present

## 2017-03-22 DIAGNOSIS — I482 Chronic atrial fibrillation: Secondary | ICD-10-CM | POA: Diagnosis not present

## 2017-03-22 DIAGNOSIS — E039 Hypothyroidism, unspecified: Secondary | ICD-10-CM | POA: Diagnosis not present

## 2017-03-22 DIAGNOSIS — N183 Chronic kidney disease, stage 3 (moderate): Secondary | ICD-10-CM | POA: Diagnosis not present

## 2017-03-22 DIAGNOSIS — I129 Hypertensive chronic kidney disease with stage 1 through stage 4 chronic kidney disease, or unspecified chronic kidney disease: Secondary | ICD-10-CM | POA: Diagnosis not present

## 2017-03-22 DIAGNOSIS — I2510 Atherosclerotic heart disease of native coronary artery without angina pectoris: Secondary | ICD-10-CM | POA: Diagnosis not present

## 2017-03-22 DIAGNOSIS — Z0000 Encounter for general adult medical examination without abnormal findings: Secondary | ICD-10-CM | POA: Diagnosis not present

## 2017-03-30 DIAGNOSIS — H26492 Other secondary cataract, left eye: Secondary | ICD-10-CM | POA: Diagnosis not present

## 2017-04-05 DIAGNOSIS — I482 Chronic atrial fibrillation: Secondary | ICD-10-CM | POA: Diagnosis not present

## 2017-05-06 DIAGNOSIS — I482 Chronic atrial fibrillation: Secondary | ICD-10-CM | POA: Diagnosis not present

## 2017-06-01 DIAGNOSIS — I482 Chronic atrial fibrillation: Secondary | ICD-10-CM | POA: Diagnosis not present

## 2017-06-10 DIAGNOSIS — R791 Abnormal coagulation profile: Secondary | ICD-10-CM | POA: Diagnosis not present

## 2017-07-09 DIAGNOSIS — I482 Chronic atrial fibrillation: Secondary | ICD-10-CM | POA: Diagnosis not present

## 2017-07-09 DIAGNOSIS — I2510 Atherosclerotic heart disease of native coronary artery without angina pectoris: Secondary | ICD-10-CM | POA: Diagnosis not present

## 2017-07-09 DIAGNOSIS — E782 Mixed hyperlipidemia: Secondary | ICD-10-CM | POA: Diagnosis not present

## 2017-07-09 DIAGNOSIS — I129 Hypertensive chronic kidney disease with stage 1 through stage 4 chronic kidney disease, or unspecified chronic kidney disease: Secondary | ICD-10-CM | POA: Diagnosis not present

## 2017-07-09 DIAGNOSIS — N183 Chronic kidney disease, stage 3 (moderate): Secondary | ICD-10-CM | POA: Diagnosis not present

## 2017-07-12 DIAGNOSIS — I482 Chronic atrial fibrillation: Secondary | ICD-10-CM | POA: Diagnosis not present

## 2017-08-11 DIAGNOSIS — I482 Chronic atrial fibrillation: Secondary | ICD-10-CM | POA: Diagnosis not present

## 2017-09-01 DIAGNOSIS — L570 Actinic keratosis: Secondary | ICD-10-CM | POA: Diagnosis not present

## 2017-09-01 DIAGNOSIS — D2230 Melanocytic nevi of unspecified part of face: Secondary | ICD-10-CM | POA: Diagnosis not present

## 2017-09-01 DIAGNOSIS — L821 Other seborrheic keratosis: Secondary | ICD-10-CM | POA: Diagnosis not present

## 2017-09-01 DIAGNOSIS — D1800 Hemangioma unspecified site: Secondary | ICD-10-CM | POA: Diagnosis not present

## 2017-09-01 DIAGNOSIS — D225 Melanocytic nevi of trunk: Secondary | ICD-10-CM | POA: Diagnosis not present

## 2017-09-01 DIAGNOSIS — D485 Neoplasm of uncertain behavior of skin: Secondary | ICD-10-CM | POA: Diagnosis not present

## 2017-09-01 DIAGNOSIS — D229 Melanocytic nevi, unspecified: Secondary | ICD-10-CM | POA: Diagnosis not present

## 2017-09-01 DIAGNOSIS — Z1283 Encounter for screening for malignant neoplasm of skin: Secondary | ICD-10-CM | POA: Diagnosis not present

## 2017-09-01 DIAGNOSIS — L578 Other skin changes due to chronic exposure to nonionizing radiation: Secondary | ICD-10-CM | POA: Diagnosis not present

## 2017-09-01 DIAGNOSIS — L820 Inflamed seborrheic keratosis: Secondary | ICD-10-CM | POA: Diagnosis not present

## 2017-09-13 DIAGNOSIS — E039 Hypothyroidism, unspecified: Secondary | ICD-10-CM | POA: Diagnosis not present

## 2017-09-13 DIAGNOSIS — N183 Chronic kidney disease, stage 3 (moderate): Secondary | ICD-10-CM | POA: Diagnosis not present

## 2017-09-13 DIAGNOSIS — I482 Chronic atrial fibrillation: Secondary | ICD-10-CM | POA: Diagnosis not present

## 2017-09-13 DIAGNOSIS — I2510 Atherosclerotic heart disease of native coronary artery without angina pectoris: Secondary | ICD-10-CM | POA: Diagnosis not present

## 2017-09-13 DIAGNOSIS — I129 Hypertensive chronic kidney disease with stage 1 through stage 4 chronic kidney disease, or unspecified chronic kidney disease: Secondary | ICD-10-CM | POA: Diagnosis not present

## 2017-09-20 DIAGNOSIS — I2510 Atherosclerotic heart disease of native coronary artery without angina pectoris: Secondary | ICD-10-CM | POA: Diagnosis not present

## 2017-09-20 DIAGNOSIS — E039 Hypothyroidism, unspecified: Secondary | ICD-10-CM | POA: Diagnosis not present

## 2017-09-20 DIAGNOSIS — I129 Hypertensive chronic kidney disease with stage 1 through stage 4 chronic kidney disease, or unspecified chronic kidney disease: Secondary | ICD-10-CM | POA: Diagnosis not present

## 2017-09-20 DIAGNOSIS — N183 Chronic kidney disease, stage 3 (moderate): Secondary | ICD-10-CM | POA: Diagnosis not present

## 2017-09-20 DIAGNOSIS — I482 Chronic atrial fibrillation: Secondary | ICD-10-CM | POA: Diagnosis not present

## 2017-10-13 DIAGNOSIS — L570 Actinic keratosis: Secondary | ICD-10-CM | POA: Diagnosis not present

## 2017-10-13 DIAGNOSIS — C4441 Basal cell carcinoma of skin of scalp and neck: Secondary | ICD-10-CM | POA: Diagnosis not present

## 2017-10-13 DIAGNOSIS — L578 Other skin changes due to chronic exposure to nonionizing radiation: Secondary | ICD-10-CM | POA: Diagnosis not present

## 2017-10-13 DIAGNOSIS — C4491 Basal cell carcinoma of skin, unspecified: Secondary | ICD-10-CM

## 2017-10-13 DIAGNOSIS — D485 Neoplasm of uncertain behavior of skin: Secondary | ICD-10-CM | POA: Diagnosis not present

## 2017-10-13 HISTORY — DX: Basal cell carcinoma of skin, unspecified: C44.91

## 2017-10-14 DIAGNOSIS — I482 Chronic atrial fibrillation: Secondary | ICD-10-CM | POA: Diagnosis not present

## 2017-11-16 DIAGNOSIS — I482 Chronic atrial fibrillation: Secondary | ICD-10-CM | POA: Diagnosis not present

## 2017-12-07 DIAGNOSIS — Z23 Encounter for immunization: Secondary | ICD-10-CM | POA: Diagnosis not present

## 2017-12-16 DIAGNOSIS — I482 Chronic atrial fibrillation, unspecified: Secondary | ICD-10-CM | POA: Diagnosis not present

## 2018-01-11 DIAGNOSIS — C4441 Basal cell carcinoma of skin of scalp and neck: Secondary | ICD-10-CM | POA: Diagnosis not present

## 2018-01-11 DIAGNOSIS — D224 Melanocytic nevi of scalp and neck: Secondary | ICD-10-CM | POA: Diagnosis not present

## 2018-01-14 DIAGNOSIS — I482 Chronic atrial fibrillation, unspecified: Secondary | ICD-10-CM | POA: Diagnosis not present

## 2018-01-14 DIAGNOSIS — I129 Hypertensive chronic kidney disease with stage 1 through stage 4 chronic kidney disease, or unspecified chronic kidney disease: Secondary | ICD-10-CM | POA: Diagnosis not present

## 2018-01-14 DIAGNOSIS — N183 Chronic kidney disease, stage 3 (moderate): Secondary | ICD-10-CM | POA: Diagnosis not present

## 2018-01-14 DIAGNOSIS — E782 Mixed hyperlipidemia: Secondary | ICD-10-CM | POA: Diagnosis not present

## 2018-01-14 DIAGNOSIS — I2510 Atherosclerotic heart disease of native coronary artery without angina pectoris: Secondary | ICD-10-CM | POA: Diagnosis not present

## 2018-01-18 DIAGNOSIS — C4441 Basal cell carcinoma of skin of scalp and neck: Secondary | ICD-10-CM | POA: Diagnosis not present

## 2018-01-24 DIAGNOSIS — I2510 Atherosclerotic heart disease of native coronary artery without angina pectoris: Secondary | ICD-10-CM | POA: Diagnosis not present

## 2018-01-24 DIAGNOSIS — I482 Chronic atrial fibrillation, unspecified: Secondary | ICD-10-CM | POA: Diagnosis not present

## 2018-02-21 DIAGNOSIS — B9689 Other specified bacterial agents as the cause of diseases classified elsewhere: Secondary | ICD-10-CM | POA: Diagnosis not present

## 2018-02-21 DIAGNOSIS — J209 Acute bronchitis, unspecified: Secondary | ICD-10-CM | POA: Diagnosis not present

## 2018-02-21 DIAGNOSIS — J019 Acute sinusitis, unspecified: Secondary | ICD-10-CM | POA: Diagnosis not present

## 2018-02-21 DIAGNOSIS — Z7901 Long term (current) use of anticoagulants: Secondary | ICD-10-CM | POA: Diagnosis not present

## 2018-02-24 DIAGNOSIS — I482 Chronic atrial fibrillation, unspecified: Secondary | ICD-10-CM | POA: Diagnosis not present

## 2018-02-24 DIAGNOSIS — I2510 Atherosclerotic heart disease of native coronary artery without angina pectoris: Secondary | ICD-10-CM | POA: Diagnosis not present

## 2018-03-22 DIAGNOSIS — I129 Hypertensive chronic kidney disease with stage 1 through stage 4 chronic kidney disease, or unspecified chronic kidney disease: Secondary | ICD-10-CM | POA: Diagnosis not present

## 2018-03-22 DIAGNOSIS — I2510 Atherosclerotic heart disease of native coronary artery without angina pectoris: Secondary | ICD-10-CM | POA: Diagnosis not present

## 2018-03-22 DIAGNOSIS — N183 Chronic kidney disease, stage 3 (moderate): Secondary | ICD-10-CM | POA: Diagnosis not present

## 2018-03-22 DIAGNOSIS — R791 Abnormal coagulation profile: Secondary | ICD-10-CM | POA: Diagnosis not present

## 2018-03-22 DIAGNOSIS — E039 Hypothyroidism, unspecified: Secondary | ICD-10-CM | POA: Diagnosis not present

## 2018-03-23 DIAGNOSIS — Z0000 Encounter for general adult medical examination without abnormal findings: Secondary | ICD-10-CM | POA: Diagnosis not present

## 2018-03-23 DIAGNOSIS — N183 Chronic kidney disease, stage 3 (moderate): Secondary | ICD-10-CM | POA: Diagnosis not present

## 2018-03-23 DIAGNOSIS — I482 Chronic atrial fibrillation, unspecified: Secondary | ICD-10-CM | POA: Diagnosis not present

## 2018-03-23 DIAGNOSIS — E039 Hypothyroidism, unspecified: Secondary | ICD-10-CM | POA: Diagnosis not present

## 2018-03-23 DIAGNOSIS — I2510 Atherosclerotic heart disease of native coronary artery without angina pectoris: Secondary | ICD-10-CM | POA: Diagnosis not present

## 2018-03-23 DIAGNOSIS — I129 Hypertensive chronic kidney disease with stage 1 through stage 4 chronic kidney disease, or unspecified chronic kidney disease: Secondary | ICD-10-CM | POA: Diagnosis not present

## 2018-04-11 DIAGNOSIS — I2510 Atherosclerotic heart disease of native coronary artery without angina pectoris: Secondary | ICD-10-CM | POA: Diagnosis not present

## 2018-04-11 DIAGNOSIS — N183 Chronic kidney disease, stage 3 (moderate): Secondary | ICD-10-CM | POA: Diagnosis not present

## 2018-04-11 DIAGNOSIS — I482 Chronic atrial fibrillation, unspecified: Secondary | ICD-10-CM | POA: Diagnosis not present

## 2018-04-11 DIAGNOSIS — I129 Hypertensive chronic kidney disease with stage 1 through stage 4 chronic kidney disease, or unspecified chronic kidney disease: Secondary | ICD-10-CM | POA: Diagnosis not present

## 2018-04-18 DIAGNOSIS — D2260 Melanocytic nevi of unspecified upper limb, including shoulder: Secondary | ICD-10-CM | POA: Diagnosis not present

## 2018-04-18 DIAGNOSIS — D225 Melanocytic nevi of trunk: Secondary | ICD-10-CM | POA: Diagnosis not present

## 2018-04-18 DIAGNOSIS — L570 Actinic keratosis: Secondary | ICD-10-CM | POA: Diagnosis not present

## 2018-04-18 DIAGNOSIS — D485 Neoplasm of uncertain behavior of skin: Secondary | ICD-10-CM | POA: Diagnosis not present

## 2018-04-18 DIAGNOSIS — D692 Other nonthrombocytopenic purpura: Secondary | ICD-10-CM | POA: Diagnosis not present

## 2018-04-18 DIAGNOSIS — Z85828 Personal history of other malignant neoplasm of skin: Secondary | ICD-10-CM | POA: Diagnosis not present

## 2018-04-18 DIAGNOSIS — L821 Other seborrheic keratosis: Secondary | ICD-10-CM | POA: Diagnosis not present

## 2018-04-18 DIAGNOSIS — Z1283 Encounter for screening for malignant neoplasm of skin: Secondary | ICD-10-CM | POA: Diagnosis not present

## 2018-04-18 DIAGNOSIS — D1800 Hemangioma unspecified site: Secondary | ICD-10-CM | POA: Diagnosis not present

## 2018-04-18 DIAGNOSIS — L578 Other skin changes due to chronic exposure to nonionizing radiation: Secondary | ICD-10-CM | POA: Diagnosis not present

## 2018-04-22 DIAGNOSIS — R791 Abnormal coagulation profile: Secondary | ICD-10-CM | POA: Diagnosis not present

## 2018-05-23 DIAGNOSIS — Z7901 Long term (current) use of anticoagulants: Secondary | ICD-10-CM | POA: Diagnosis not present

## 2018-05-23 DIAGNOSIS — Z5181 Encounter for therapeutic drug level monitoring: Secondary | ICD-10-CM | POA: Diagnosis not present

## 2018-07-12 DIAGNOSIS — Z5181 Encounter for therapeutic drug level monitoring: Secondary | ICD-10-CM | POA: Diagnosis not present

## 2018-07-12 DIAGNOSIS — Z7901 Long term (current) use of anticoagulants: Secondary | ICD-10-CM | POA: Diagnosis not present

## 2018-08-11 DIAGNOSIS — Z5181 Encounter for therapeutic drug level monitoring: Secondary | ICD-10-CM | POA: Diagnosis not present

## 2018-08-11 DIAGNOSIS — Z7901 Long term (current) use of anticoagulants: Secondary | ICD-10-CM | POA: Diagnosis not present

## 2018-08-19 DIAGNOSIS — I2510 Atherosclerotic heart disease of native coronary artery without angina pectoris: Secondary | ICD-10-CM | POA: Diagnosis not present

## 2018-08-19 DIAGNOSIS — E782 Mixed hyperlipidemia: Secondary | ICD-10-CM | POA: Diagnosis not present

## 2018-08-19 DIAGNOSIS — I482 Chronic atrial fibrillation, unspecified: Secondary | ICD-10-CM | POA: Diagnosis not present

## 2018-08-19 DIAGNOSIS — N183 Chronic kidney disease, stage 3 (moderate): Secondary | ICD-10-CM | POA: Diagnosis not present

## 2018-08-19 DIAGNOSIS — I129 Hypertensive chronic kidney disease with stage 1 through stage 4 chronic kidney disease, or unspecified chronic kidney disease: Secondary | ICD-10-CM | POA: Diagnosis not present

## 2018-09-12 DIAGNOSIS — Z7901 Long term (current) use of anticoagulants: Secondary | ICD-10-CM | POA: Diagnosis not present

## 2018-09-12 DIAGNOSIS — Z5181 Encounter for therapeutic drug level monitoring: Secondary | ICD-10-CM | POA: Diagnosis not present

## 2018-09-21 DIAGNOSIS — E782 Mixed hyperlipidemia: Secondary | ICD-10-CM | POA: Diagnosis not present

## 2018-09-21 DIAGNOSIS — I482 Chronic atrial fibrillation, unspecified: Secondary | ICD-10-CM | POA: Diagnosis not present

## 2018-09-21 DIAGNOSIS — I129 Hypertensive chronic kidney disease with stage 1 through stage 4 chronic kidney disease, or unspecified chronic kidney disease: Secondary | ICD-10-CM | POA: Diagnosis not present

## 2018-09-21 DIAGNOSIS — N183 Chronic kidney disease, stage 3 (moderate): Secondary | ICD-10-CM | POA: Diagnosis not present

## 2018-09-21 DIAGNOSIS — I2510 Atherosclerotic heart disease of native coronary artery without angina pectoris: Secondary | ICD-10-CM | POA: Diagnosis not present

## 2018-09-21 DIAGNOSIS — E039 Hypothyroidism, unspecified: Secondary | ICD-10-CM | POA: Diagnosis not present

## 2018-10-12 DIAGNOSIS — I2510 Atherosclerotic heart disease of native coronary artery without angina pectoris: Secondary | ICD-10-CM | POA: Diagnosis not present

## 2018-10-12 DIAGNOSIS — E039 Hypothyroidism, unspecified: Secondary | ICD-10-CM | POA: Diagnosis not present

## 2018-10-12 DIAGNOSIS — I129 Hypertensive chronic kidney disease with stage 1 through stage 4 chronic kidney disease, or unspecified chronic kidney disease: Secondary | ICD-10-CM | POA: Diagnosis not present

## 2018-10-12 DIAGNOSIS — Z7901 Long term (current) use of anticoagulants: Secondary | ICD-10-CM | POA: Diagnosis not present

## 2018-10-12 DIAGNOSIS — Z5181 Encounter for therapeutic drug level monitoring: Secondary | ICD-10-CM | POA: Diagnosis not present

## 2018-10-12 DIAGNOSIS — N183 Chronic kidney disease, stage 3 (moderate): Secondary | ICD-10-CM | POA: Diagnosis not present

## 2018-10-24 DIAGNOSIS — D229 Melanocytic nevi, unspecified: Secondary | ICD-10-CM | POA: Diagnosis not present

## 2018-10-24 DIAGNOSIS — L821 Other seborrheic keratosis: Secondary | ICD-10-CM | POA: Diagnosis not present

## 2018-10-24 DIAGNOSIS — D225 Melanocytic nevi of trunk: Secondary | ICD-10-CM | POA: Diagnosis not present

## 2018-10-24 DIAGNOSIS — Z1283 Encounter for screening for malignant neoplasm of skin: Secondary | ICD-10-CM | POA: Diagnosis not present

## 2018-10-24 DIAGNOSIS — L570 Actinic keratosis: Secondary | ICD-10-CM | POA: Diagnosis not present

## 2018-10-24 DIAGNOSIS — L814 Other melanin hyperpigmentation: Secondary | ICD-10-CM | POA: Diagnosis not present

## 2018-10-24 DIAGNOSIS — D692 Other nonthrombocytopenic purpura: Secondary | ICD-10-CM | POA: Diagnosis not present

## 2018-10-24 DIAGNOSIS — L219 Seborrheic dermatitis, unspecified: Secondary | ICD-10-CM | POA: Diagnosis not present

## 2018-10-24 DIAGNOSIS — D1800 Hemangioma unspecified site: Secondary | ICD-10-CM | POA: Diagnosis not present

## 2018-10-24 DIAGNOSIS — L578 Other skin changes due to chronic exposure to nonionizing radiation: Secondary | ICD-10-CM | POA: Diagnosis not present

## 2018-11-11 DIAGNOSIS — Z5181 Encounter for therapeutic drug level monitoring: Secondary | ICD-10-CM | POA: Diagnosis not present

## 2018-11-11 DIAGNOSIS — Z7901 Long term (current) use of anticoagulants: Secondary | ICD-10-CM | POA: Diagnosis not present

## 2018-11-23 DIAGNOSIS — R350 Frequency of micturition: Secondary | ICD-10-CM | POA: Diagnosis not present

## 2018-11-23 DIAGNOSIS — M545 Low back pain: Secondary | ICD-10-CM | POA: Diagnosis not present

## 2018-11-23 DIAGNOSIS — R310 Gross hematuria: Secondary | ICD-10-CM | POA: Diagnosis not present

## 2018-11-23 DIAGNOSIS — M5442 Lumbago with sciatica, left side: Secondary | ICD-10-CM | POA: Diagnosis not present

## 2018-11-23 DIAGNOSIS — M5441 Lumbago with sciatica, right side: Secondary | ICD-10-CM | POA: Diagnosis not present

## 2018-11-23 DIAGNOSIS — I482 Chronic atrial fibrillation, unspecified: Secondary | ICD-10-CM | POA: Diagnosis not present

## 2018-11-29 DIAGNOSIS — L570 Actinic keratosis: Secondary | ICD-10-CM | POA: Diagnosis not present

## 2018-11-29 DIAGNOSIS — L821 Other seborrheic keratosis: Secondary | ICD-10-CM | POA: Diagnosis not present

## 2018-11-29 DIAGNOSIS — L219 Seborrheic dermatitis, unspecified: Secondary | ICD-10-CM | POA: Diagnosis not present

## 2018-11-29 DIAGNOSIS — L820 Inflamed seborrheic keratosis: Secondary | ICD-10-CM | POA: Diagnosis not present

## 2018-11-29 DIAGNOSIS — D1800 Hemangioma unspecified site: Secondary | ICD-10-CM | POA: Diagnosis not present

## 2018-11-29 DIAGNOSIS — L578 Other skin changes due to chronic exposure to nonionizing radiation: Secondary | ICD-10-CM | POA: Diagnosis not present

## 2018-12-12 DIAGNOSIS — Z23 Encounter for immunization: Secondary | ICD-10-CM | POA: Diagnosis not present

## 2018-12-14 DIAGNOSIS — Z5181 Encounter for therapeutic drug level monitoring: Secondary | ICD-10-CM | POA: Diagnosis not present

## 2018-12-14 DIAGNOSIS — Z7901 Long term (current) use of anticoagulants: Secondary | ICD-10-CM | POA: Diagnosis not present

## 2018-12-20 ENCOUNTER — Other Ambulatory Visit: Payer: Self-pay | Admitting: Family Medicine

## 2018-12-27 ENCOUNTER — Ambulatory Visit: Payer: PPO | Admitting: Urology

## 2018-12-27 ENCOUNTER — Other Ambulatory Visit: Payer: Self-pay

## 2018-12-27 ENCOUNTER — Encounter: Payer: Self-pay | Admitting: Urology

## 2018-12-27 VITALS — BP 172/83 | HR 58 | Ht 70.0 in | Wt 200.0 lb

## 2018-12-27 DIAGNOSIS — N400 Enlarged prostate without lower urinary tract symptoms: Secondary | ICD-10-CM

## 2018-12-27 DIAGNOSIS — R310 Gross hematuria: Secondary | ICD-10-CM

## 2018-12-27 DIAGNOSIS — R319 Hematuria, unspecified: Secondary | ICD-10-CM | POA: Diagnosis not present

## 2018-12-27 LAB — URINALYSIS, COMPLETE
Bilirubin, UA: NEGATIVE
Glucose, UA: NEGATIVE
Ketones, UA: NEGATIVE
Leukocytes,UA: NEGATIVE
Nitrite, UA: NEGATIVE
Specific Gravity, UA: 1.025 (ref 1.005–1.030)
Urobilinogen, Ur: 2 mg/dL — ABNORMAL HIGH (ref 0.2–1.0)
pH, UA: 6 (ref 5.0–7.5)

## 2018-12-27 LAB — MICROSCOPIC EXAMINATION
Bacteria, UA: NONE SEEN
RBC: >30 /hpf — AB (ref 0–2)
WBC, UA: NONE SEEN /hpf (ref 0–5)

## 2018-12-27 NOTE — Progress Notes (Signed)
12/27/2018 9:21 AM   Carl Coffey 21-Dec-1921 [id number]  Referring provider: Kirk Ruths, MD [facility] [name],  [name] [address]  Chief Complaint  Patient presents with  . Hematuria    HPI: 85 year old male who presents today for further evaluation of intermittent painless gross hematuria.  He reports that about a month ago, he had 3 days of light pink-reddish colored urine.  This is worse at nighttime.  Improved spontaneously.  Around this time, he was seen and evaluated by his primary care for the same issue.  He was treated for presumed urinary tract infection although urinalysis only showed red blood cells and no bacteria.  No acute urine culture was sent.  INR was within therapeutic range at this time.  He does have personal history of A. fib on Coumadin.  Also has a personal history of BPH on finasteride.  He was a patient of Dr. Bernardo Heater in the remote past.  It appears he had a hematuria evaluation in 2013 including a renal ultrasound which showed bilateral renal cyst, otherwise unremarkable. PRostomegaly was also appreciated on mass-effect into the base of the bladder.  He does recall having a cystoscopy at the time but cannot recall the details and there is no available documentation.  He reports that he voids very well.  He now takes a diuretic every other day on days that he is taking his diuretic he has some more urgency but otherwise no issues.  He gets up once a night to void.  He feels like he empties bladder completely.  No dysuria.  Never smoker.  No recent PSA.  He does mention today that he is extremely physically active.  He is the primary caretaker of his wife is 35 but significantly disabled.  He does all of their ADLs including yard work.  He has back pain after doing yard work and heavy lifting.  He wonders if this activity level may be exacerbating the blood in his urine.   PMH: Past Medical History:  Diagnosis  Date  . Coronary artery disease   . Hypertension   . Thyroid disease     Surgical History: Past Surgical History:  Procedure Laterality Date  . CARDIAC SURGERY  1995   CABG x 3  . HERNIA REPAIR      Home Medications:  Allergies as of 12/27/2018   No Known Allergies     Medication List       Accurate as of December 27, 2018  9:21 AM. If you have any questions, ask your nurse or doctor.        finasteride 5 MG tablet Commonly known as: PROSCAR Take 1 tablet by mouth daily.   furosemide 20 MG tablet Commonly known as: Lasix Take 1 tablet (20 mg total) by mouth daily.   levothyroxine 75 MCG tablet Commonly known as: SYNTHROID Take 1 tablet by mouth every morning.   metoprolol succinate 25 MG 24 hr tablet Commonly known as: TOPROL-XL Take 1 tablet by mouth daily.   simvastatin 10 MG tablet Commonly known as: ZOCOR Take 1 tablet by mouth daily.   warfarin 2 MG tablet Commonly known as: COUMADIN Take 1 tablet by mouth daily.       Allergies: No Known Allergies  Family History: No family history on file.  Social History:  reports that he has never smoked. He has never used smokeless tobacco. No history on file for alcohol and drug.  ROS: UROLOGY Frequent  Urination?: No Hard to postpone urination?: No Burning/pain with urination?: No Get up at night to urinate?: No Leakage of urine?: No Urine stream starts and stops?: No Trouble starting stream?: No Do you have to strain to urinate?: No Blood in urine?: Yes Urinary tract infection?: No Sexually transmitted disease?: No Injury to kidneys or bladder?: No Painful intercourse?: No Weak stream?: No Erection problems?: No Penile pain?: No  Gastrointestinal Nausea?: No Vomiting?: No Indigestion/heartburn?: No Diarrhea?: No Constipation?: No  Constitutional Fever: No Night sweats?: No Weight loss?: No Fatigue?: No  Skin Skin rash/lesions?: No Itching?: No  Eyes Blurred vision?: No Double  vision?: No  Ears/Nose/Throat Sore throat?: No Sinus problems?: No  Hematologic/Lymphatic Swollen glands?: No Easy bruising?: No  Cardiovascular Leg swelling?: No Chest pain?: No  Respiratory Cough?: No Shortness of breath?: No  Endocrine Excessive thirst?: No  Musculoskeletal Back pain?: No Joint pain?: No  Neurological Headaches?: No Dizziness?: No  Psychologic Depression?: No Anxiety?: No  Physical Exam: BP (!) 172/83   Pulse (!) 58   Ht 5\' 10"  (1.778 m)   Wt 200 lb (90.7 kg)   BMI 28.70 kg/m   Constitutional:  Alert and oriented, No acute distress.  Appears significantly younger than his stated age. HEENT: [place] AT, moist mucus membranes.  Trachea midline, no masses. Cardiovascular: No clubbing, cyanosis, or edema. Respiratory: Normal respiratory effort, no increased work of breathing. GI: Abdomen is soft, nontender, nondistended, no abdominal masses Skin: No rashes, bruises or suspicious lesions. Neurologic: Grossly intact, no focal deficits, moving all 4 extremities. Psychiatric: Normal mood and affect.  Laboratory Data: Labs from care everywhere reviewed  Urinalysis UA today unremarkable other than greater than 30 red blood cells per high-powered field  Pertinent Imaging: No recent imaging  Assessment & Plan:    1. Gross hematuria Microscopic and gross hematuria not associated with urinary tract infection  Differential diagnosis was discussed at length with the patient.  Although he is on Coumadin, hematuria evaluation is still recommended.  Given his age, we did discuss the option of foregoing evaluation with the understanding that he may have underlying pathology which could easily be treated or followed.  Alternatively, could pursue modified hematuria evaluation including renal ultrasound and cystoscopy which helped would help rule out any major pathology.  Full hematuria evaluation including CT urogram was also discussed.  Risk and benefits of  each were discussed.  He would like to undergo renal ultrasound and consider cystoscopy we will set him up for this tentatively.  2. Benign prostatic hyperplasia without lower urinary tract symptoms Personal history of prostamegaly as seen on previous scans.  Symptoms well controlled on finasteride. - Urinalysis, Complete - US RENAL; Future    Return in about 4 weeks (around 01/24/2019) for RUS, cysto.  Hollice Espy, MD  [facility] [address], [place], [place] [address] [phone]

## 2018-12-27 NOTE — Patient Instructions (Signed)
Cystoscopy Cystoscopy is a procedure that is used to help diagnose and sometimes treat conditions that affect the lower urinary tract. The lower urinary tract includes the bladder and the urethra. The urethra is the tube that drains urine from the bladder. Cystoscopy is done using a thin, tube-shaped instrument with a light and camera at the end (cystoscope). The cystoscope may be hard or flexible, depending on the goal of the procedure. The cystoscope is inserted through the urethra, into the bladder. Cystoscopy may be recommended if you have:  Urinary tract infections that keep coming back.  Blood in the urine (hematuria).  An inability to control when you urinate (urinary incontinence) or an overactive bladder.  Unusual cells found in a urine sample.  A blockage in the urethra, such as a urinary stone.  Painful urination.  An abnormality in the bladder found during an intravenous pyelogram (IVP) or CT scan. Cystoscopy may also be done to remove a sample of tissue to be examined under a microscope (biopsy). Tell a health care provider about:  Any allergies you have.  All medicines you are taking, including vitamins, herbs, eye drops, creams, and over-the-counter medicines.  Any problems you or family members have had with anesthetic medicines.  Any blood disorders you have.  Any surgeries you have had.  Any medical conditions you have.  Whether you are pregnant or may be pregnant. What are the risks? Generally, this is a safe procedure. However, problems may occur, including:  Infection.  Bleeding.  Allergic reactions to medicines.  Damage to other structures or organs. What happens before the procedure?  Ask your health care provider about: ? Changing or stopping your regular medicines. This is especially important if you are taking diabetes medicines or blood thinners. ? Taking medicines such as aspirin and ibuprofen. These medicines can thin your blood. Do not take  these medicines unless your health care provider tells you to take them. ? Taking over-the-counter medicines, vitamins, herbs, and supplements.  Follow instructions from your health care provider about eating or drinking restrictions.  Ask your health care provider what steps will be taken to help prevent infection. These may include: ? Washing skin with a germ-killing soap. ? Taking antibiotic medicine.  You may have an exam or testing, such as: ? X-rays of the bladder, urethra, or kidneys. ? Urine tests to check for signs of infection.  Plan to have someone take you home from the hospital or clinic. What happens during the procedure?   You will be given one or more of the following: ? A medicine to help you relax (sedative). ? A medicine to numb the area (local anesthetic).  The area around the opening of your urethra will be cleaned.  The cystoscope will be passed through your urethra into your bladder.  Germ-free (sterile) fluid will flow through the cystoscope to fill your bladder. The fluid will stretch your bladder so that your health care provider can clearly examine your bladder walls.  Your doctor will look at the urethra and bladder. Your doctor may take a biopsy or remove stones.  The cystoscope will be removed, and your bladder will be emptied. The procedure may vary among health care providers and hospitals. What can I expect after the procedure? After the procedure, it is common to have:  Some soreness or pain in your abdomen and urethra.  Urinary symptoms. These include: ? Mild pain or burning when you urinate. Pain should stop within a few minutes after you urinate. This   may last for up to 1 week. ? A small amount of blood in your urine for several days. ? Feeling like you need to urinate but producing only a small amount of urine. Follow these instructions at home: Medicines  Take over-the-counter and prescription medicines only as told by your health care  provider.  If you were prescribed an antibiotic medicine, take it as told by your health care provider. Do not stop taking the antibiotic even if you start to feel better. General instructions  Return to your normal activities as told by your health care provider. Ask your health care provider what activities are safe for you.  Do not drive for 24 hours if you were given a sedative during your procedure.  Watch for any blood in your urine. If the amount of blood in your urine increases, call your health care provider.  Follow instructions from your health care provider about eating or drinking restrictions.  If a tissue sample was removed for testing (biopsy) during your procedure, it is up to you to get your test results. Ask your health care provider, or the department that is doing the test, when your results will be ready.  Drink enough fluid to keep your urine pale yellow.  Keep all follow-up visits as told by your health care provider. This is important. Contact a health care provider if you:  Have pain that gets worse or does not get better with medicine, especially pain when you urinate.  Have trouble urinating.  Have more blood in your urine. Get help right away if you:  Have blood clots in your urine.  Have abdominal pain.  Have a fever or chills.  Are unable to urinate. Summary  Cystoscopy is a procedure that is used to help diagnose and sometimes treat conditions that affect the lower urinary tract.  Cystoscopy is done using a thin, tube-shaped instrument with a light and camera at the end.  After the procedure, it is common to have some soreness or pain in your abdomen and urethra.  Watch for any blood in your urine. If the amount of blood in your urine increases, call your health care provider.  If you were prescribed an antibiotic medicine, take it as told by your health care provider. Do not stop taking the antibiotic even if you start to feel better. This  information is not intended to replace advice given to you by your health care provider. Make sure you discuss any questions you have with your health care provider. Document Released: 02/28/2000 Document Revised: 02/22/2018 Document Reviewed: 02/22/2018 Elsevier Patient Education  2020 [employer]  

## 2019-01-13 DIAGNOSIS — R319 Hematuria, unspecified: Secondary | ICD-10-CM | POA: Diagnosis not present

## 2019-01-13 DIAGNOSIS — M5432 Sciatica, left side: Secondary | ICD-10-CM | POA: Diagnosis not present

## 2019-01-13 DIAGNOSIS — Z7901 Long term (current) use of anticoagulants: Secondary | ICD-10-CM | POA: Diagnosis not present

## 2019-01-13 DIAGNOSIS — Z5181 Encounter for therapeutic drug level monitoring: Secondary | ICD-10-CM | POA: Diagnosis not present

## 2019-01-19 ENCOUNTER — Other Ambulatory Visit: Payer: Self-pay

## 2019-01-19 ENCOUNTER — Ambulatory Visit
Admission: RE | Admit: 2019-01-19 | Discharge: 2019-01-19 | Disposition: A | Discharge status: Home / Self Care | Payer: PPO | Source: Ambulatory Visit | Attending: Urology | Admitting: Urology

## 2019-01-19 DIAGNOSIS — N400 Enlarged prostate without lower urinary tract symptoms: Secondary | ICD-10-CM | POA: Diagnosis not present

## 2019-01-19 DIAGNOSIS — R310 Gross hematuria: Secondary | ICD-10-CM | POA: Diagnosis not present

## 2019-01-19 DIAGNOSIS — N281 Cyst of kidney, acquired: Secondary | ICD-10-CM | POA: Diagnosis not present

## 2019-01-24 ENCOUNTER — Other Ambulatory Visit: Payer: Self-pay | Admitting: Radiology

## 2019-01-24 ENCOUNTER — Encounter: Payer: Self-pay | Admitting: Urology

## 2019-01-24 ENCOUNTER — Ambulatory Visit (INDEPENDENT_AMBULATORY_CARE_PROVIDER_SITE_OTHER): Payer: PPO | Admitting: Urology

## 2019-01-24 ENCOUNTER — Encounter: Payer: Self-pay | Admitting: Radiology

## 2019-01-24 VITALS — BP 185/101 | HR 59 | Ht 70.0 in | Wt 201.0 lb

## 2019-01-24 DIAGNOSIS — R310 Gross hematuria: Secondary | ICD-10-CM | POA: Diagnosis not present

## 2019-01-24 DIAGNOSIS — N35911 Unspecified urethral stricture, male, meatal: Secondary | ICD-10-CM

## 2019-01-24 DIAGNOSIS — N3289 Other specified disorders of bladder: Secondary | ICD-10-CM

## 2019-01-24 LAB — URINALYSIS, COMPLETE
Bilirubin, UA: NEGATIVE
Glucose, UA: NEGATIVE
Ketones, UA: NEGATIVE
Nitrite, UA: NEGATIVE
Protein,UA: NEGATIVE
Specific Gravity, UA: 1.02 (ref 1.005–1.030)
Urobilinogen, Ur: 2 mg/dL — ABNORMAL HIGH (ref 0.2–1.0)
pH, UA: 7 (ref 5.0–7.5)

## 2019-01-24 LAB — MICROSCOPIC EXAMINATION

## 2019-01-24 MED ORDER — CEFTRIAXONE SODIUM 500 MG IJ SOLR
1.0000 g | Freq: Once | INTRAMUSCULAR | Status: AC
  Administered 2019-01-24: 1 g via INTRAMUSCULAR

## 2019-01-24 MED ORDER — SODIUM CHLORIDE 0.9 % IR SOLN: 2000.0000 mg | Freq: Once | Status: DC

## 2019-01-24 NOTE — Progress Notes (Signed)
   01/24/19  CC:  Chief Complaint  Patient presents with  . Cysto    HPI: 85 year old male who presents today for cystoscopy for further evaluation of gross hematuria episodes.  On renal ultrasound, there appears to be possible bladder mass, 1.9 cm along left lateral bladder wall.  Kidneys normal with bilateral renal cysts.    He has a personal history of CAD/ Afib.  He is on Coumadin.  Followed by Dr. Ubaldo Glassing.   Blood pressure (!) 185/101, pulse (!) 59, height 5\' 10"  (1.778 m), weight 201 lb (91.2 kg). NED. A&Ox3.   No respiratory distress   Abd soft, NT, ND Normal phallus with bilateral descended testicles  Cystoscopy Procedure Note  Patient identification was confirmed, informed consent was obtained, and patient was prepped using Betadine solution.  Lidocaine jelly was administered per urethral meatus.     Pre-Procedure: - Inspection reveals a normal caliber ureteral meatus.  Procedure: The flexible cystoscope was introduced without difficulty -Narrow urethral meatus requiring forceps dilation to accommodate 16 [place] scope. - Enlarged prostate bilobar coaptation, prostamegaly - Elevated bladder neck - Bilateral ureteral orifices identified - Bladder mucosa  reveals an approximately 2 cm area of carpeting/broad-based papillary tumor on the left lateral bladder wall with surrounding erythema. - No bladder stones - Mild trabeculation   Post-Procedure: - Patient tolerated the procedure well  Assessment/ Plan:  1. Bladder mass Consistent with bladder cancer, suspect superficial possibly with surrounding CIS  We discussed the natural history of bladder cancer.  Given that the lesion appears to have a superficial appearance, I would recommend consideration of TURBT as this may be the only intervention necessary.  We discussed the risk of bleeding, infection, damage surrounding structures, perforation amongst others.  Risk of anesthesia was also discussed especially given  his age.  We also discussed proceeding with bilateral retrograde pyelogram to evaluate his upper tracts as he will go under anesthesia and this can be easily performed.  Additionally, would strongly recommend intravesical  Gemcitabine postoperatively if possible if no contraindications to reduce the risk of recurrence.  We discussed the side effects of this medication as well.  Given periprocedural ceftriaxone today as a precaution given manipulation.  His son was present for the conversation today.  They were both agreeable with proceeding to the operating room.  Will obtain cardiac clearance from Dr. Ubaldo Glassing, he will need to hold Coumadin.   2. Stricture of urethral meatus in male, unspecified stricture type Narrow urethral meatus requiring dilation today  May require meatotomy at the time of procedure versus dilation.  3. Gross hematuria Likely secondary #1 - Urinalysis, Complete   Hollice Espy, MD

## 2019-01-24 NOTE — H&P (View-Only) (Signed)
   01/24/19  CC:  Chief Complaint  Patient presents with  . Cysto    HPI: 85 year old male who presents today for cystoscopy for further evaluation of gross hematuria episodes.  On renal ultrasound, there appears to be possible bladder mass, 1.9 cm along left lateral bladder wall.  Kidneys normal with bilateral renal cysts.    He has a personal history of CAD/ Afib.  He is on Coumadin.  Followed by Dr. Ubaldo Glassing.   Blood pressure (!) 185/101, pulse (!) 59, height 5\' 10"  (1.778 m), weight 201 lb (91.2 kg). NED. A&Ox3.   No respiratory distress   Abd soft, NT, ND Normal phallus with bilateral descended testicles  Cystoscopy Procedure Note  Patient identification was confirmed, informed consent was obtained, and patient was prepped using Betadine solution.  Lidocaine jelly was administered per urethral meatus.     Pre-Procedure: - Inspection reveals a normal caliber ureteral meatus.  Procedure: The flexible cystoscope was introduced without difficulty -Narrow urethral meatus requiring forceps dilation to accommodate 16 [place] scope. - Enlarged prostate bilobar coaptation, prostamegaly - Elevated bladder neck - Bilateral ureteral orifices identified - Bladder mucosa  reveals an approximately 2 cm area of carpeting/broad-based papillary tumor on the left lateral bladder wall with surrounding erythema. - No bladder stones - Mild trabeculation   Post-Procedure: - Patient tolerated the procedure well  Assessment/ Plan:  1. Bladder mass Consistent with bladder cancer, suspect superficial possibly with surrounding CIS  We discussed the natural history of bladder cancer.  Given that the lesion appears to have a superficial appearance, I would recommend consideration of TURBT as this may be the only intervention necessary.  We discussed the risk of bleeding, infection, damage surrounding structures, perforation amongst others.  Risk of anesthesia was also discussed especially given  his age.  We also discussed proceeding with bilateral retrograde pyelogram to evaluate his upper tracts as he will go under anesthesia and this can be easily performed.  Additionally, would strongly recommend intravesical  Gemcitabine postoperatively if possible if no contraindications to reduce the risk of recurrence.  We discussed the side effects of this medication as well.  Given periprocedural ceftriaxone today as a precaution given manipulation.  His son was present for the conversation today.  They were both agreeable with proceeding to the operating room.  Will obtain cardiac clearance from Dr. Ubaldo Glassing, he will need to hold Coumadin.   2. Stricture of urethral meatus in male, unspecified stricture type Narrow urethral meatus requiring dilation today  May require meatotomy at the time of procedure versus dilation.  3. Gross hematuria Likely secondary #1 - Urinalysis, Complete   Hollice Espy, MD

## 2019-01-24 NOTE — Addendum Note (Signed)
Addended by: Verlene Mayer A on: 01/24/2019 12:35 PM   Modules accepted: Orders

## 2019-01-25 DIAGNOSIS — L820 Inflamed seborrheic keratosis: Secondary | ICD-10-CM | POA: Diagnosis not present

## 2019-01-25 DIAGNOSIS — L578 Other skin changes due to chronic exposure to nonionizing radiation: Secondary | ICD-10-CM | POA: Diagnosis not present

## 2019-01-25 DIAGNOSIS — D1800 Hemangioma unspecified site: Secondary | ICD-10-CM | POA: Diagnosis not present

## 2019-01-25 DIAGNOSIS — L570 Actinic keratosis: Secondary | ICD-10-CM | POA: Diagnosis not present

## 2019-01-25 DIAGNOSIS — L821 Other seborrheic keratosis: Secondary | ICD-10-CM | POA: Diagnosis not present

## 2019-01-25 DIAGNOSIS — L814 Other melanin hyperpigmentation: Secondary | ICD-10-CM | POA: Diagnosis not present

## 2019-01-27 LAB — CULTURE, URINE COMPREHENSIVE

## 2019-02-02 ENCOUNTER — Other Ambulatory Visit: Payer: Self-pay

## 2019-02-02 ENCOUNTER — Encounter
Admission: RE | Admit: 2019-02-02 | Discharge: 2019-02-02 | Disposition: A | Discharge status: Home / Self Care | Payer: PPO | Source: Ambulatory Visit | Attending: Urology | Admitting: Urology

## 2019-02-02 DIAGNOSIS — R310 Gross hematuria: Secondary | ICD-10-CM | POA: Insufficient documentation

## 2019-02-02 DIAGNOSIS — I10 Essential (primary) hypertension: Secondary | ICD-10-CM | POA: Insufficient documentation

## 2019-02-02 DIAGNOSIS — I4891 Unspecified atrial fibrillation: Secondary | ICD-10-CM | POA: Diagnosis not present

## 2019-02-02 DIAGNOSIS — N329 Bladder disorder, unspecified: Secondary | ICD-10-CM | POA: Diagnosis not present

## 2019-02-02 DIAGNOSIS — R9431 Abnormal electrocardiogram [ECG] [EKG]: Secondary | ICD-10-CM | POA: Insufficient documentation

## 2019-02-02 DIAGNOSIS — Z20828 Contact with and (suspected) exposure to other viral communicable diseases: Secondary | ICD-10-CM | POA: Insufficient documentation

## 2019-02-02 DIAGNOSIS — I4510 Unspecified right bundle-branch block: Secondary | ICD-10-CM | POA: Insufficient documentation

## 2019-02-02 DIAGNOSIS — I452 Bifascicular block: Secondary | ICD-10-CM | POA: Insufficient documentation

## 2019-02-02 DIAGNOSIS — Q6433 Congenital stricture of urinary meatus: Secondary | ICD-10-CM | POA: Diagnosis not present

## 2019-02-02 DIAGNOSIS — Z01818 Encounter for other preprocedural examination: Secondary | ICD-10-CM | POA: Insufficient documentation

## 2019-02-02 DIAGNOSIS — I444 Left anterior fascicular block: Secondary | ICD-10-CM | POA: Diagnosis not present

## 2019-02-02 HISTORY — DX: Unspecified atrial fibrillation: I48.91

## 2019-02-02 HISTORY — DX: Hypothyroidism, unspecified: E03.9

## 2019-02-02 HISTORY — DX: Acute myocardial infarction, unspecified: I21.9

## 2019-02-02 LAB — CBC
HCT: 44.2 % (ref 39.0–52.0)
Hemoglobin: 15 g/dL (ref 13.0–17.0)
MCH: 32.3 pg (ref 26.0–34.0)
MCHC: 33.9 g/dL (ref 30.0–36.0)
MCV: 95.1 fL (ref 80.0–100.0)
Platelets: 177 10*3/uL (ref 150–400)
RBC: 4.65 MIL/uL (ref 4.22–5.81)
RDW: 13.2 % (ref 11.5–15.5)
WBC: 6.7 10*3/uL (ref 4.0–10.5)
nRBC: 0 % (ref 0.0–0.2)

## 2019-02-02 LAB — BASIC METABOLIC PANEL
Anion gap: 8 (ref 5–15)
BUN: 23 mg/dL (ref 8–23)
CO2: 27 mmol/L (ref 22–32)
Calcium: 9.2 mg/dL (ref 8.9–10.3)
Chloride: 103 mmol/L (ref 98–111)
Creatinine, Ser: 1.03 mg/dL (ref 0.61–1.24)
GFR calc Af Amer: >60 mL/min (ref >60)
GFR calc non Af Amer: >60 mL/min (ref >60)
Glucose, Bld: 99 mg/dL (ref 70–99)
Potassium: 4.4 mmol/L (ref 3.5–5.1)
Sodium: 138 mmol/L (ref 135–145)

## 2019-02-02 LAB — SARS CORONAVIRUS 2 (TAT 6-24 HRS): SARS Coronavirus 2: NEGATIVE

## 2019-02-02 NOTE — Pre-Procedure Instructions (Signed)
Discussed with Dr Andree Elk pts request for spinal anesthesia . Dr Ron Agee Pt-INR to get obtained morning of surgery. Pt informed.

## 2019-02-02 NOTE — Patient Instructions (Addendum)
Your procedure is scheduled on: Monday February 06, 2019 [facility] To find out your arrival time, please call [phone] between [place] on: February 03, 2019  REMEMBER: Instructions that are not followed completely may result in serious medical risk, up to and including death; or upon the discretion of your surgeon and anesthesiologist your surgery may need to be rescheduled.  Do not eat food after midnight the night before surgery.  No gum chewing, lozengers or hard candies.  You may however, drink CLEAR liquids up to 2 hours before you are scheduled to arrive for your surgery. Do not drink anything within 2 hours of the start of your surgery.  Clear liquids include: - water  - apple juice without pulp -CLEAR gatorade - black coffee or tea (Do NOT add milk or creamers to the coffee or tea) Do NOT drink anything that is not on this list.   No Alcohol for 24 hours before or after surgery.  No Smoking including e-cigarettes for 24 hours prior to surgery.  No chewable tobacco products for at least 6 hours prior to surgery.  No nicotine patches on the day of surgery.  On the morning of surgery brush your teeth with toothpaste and water, you may rinse your mouth with mouthwash if you wish. Do not swallow any toothpaste or mouthwash.  Notify your doctor if there is any change in your medical condition (cold, fever, infection).  Do not wear jewelry, make-up, hairpins, clips or nail polish.  Do not wear lotions, powders, or perfumes.   Do not shave 48 hours prior to surgery.   Contacts and dentures may not be worn into surgery.  Do not bring valuables to the hospital, including drivers license, insurance or credit cards.  [facility] is not responsible for any belongings or valuables.   TAKE THESE MEDICATIONS THE MORNING OF SURGERY: FINASTERIDE LEVOTHYROXINE GABAPENTIN METOPROLOL  DO NOT TAKE FUROSEMIDE(LASIX)  TAKE SHOWER DAY OF SURGERY  Follow  recommendations from Cardiologist, Pulmonologist or PCP regarding stopping Aspirin STOP 7 DAYS BEFORE SURGERY AND  STOP WARFARIN 5 DAYS BEFORE SURGERY  Stop Anti-inflammatories (NSAIDS) such as Advil, Aleve, Ibuprofen, Motrin, Naproxen, Naprosyn and Aspirin based products such as Excedrin, Goodys Powder, BC Powder. (May take Tylenol or Acetaminophen if needed.)  Stop ANY OVER THE COUNTER supplements until after surgery. (May continue Vitamin D, Vitamin B, and multivitamin.)  Wear comfortable clothing (specific to your surgery type) to the hospital.  Plan for stool softeners for home use.  If you are being discharged the day of surgery, you will not be allowed to drive home. You will need a responsible adult to drive you home and stay with you that night.   If you are taking public transportation, you will need to have a responsible adult with you. Please confirm with your physician that it is acceptable to use public transportation.   Please call [phone] if you have any questions about these instructions.

## 2019-02-06 ENCOUNTER — Ambulatory Visit: Payer: PPO | Admitting: Anesthesiology

## 2019-02-06 ENCOUNTER — Telehealth: Payer: Self-pay | Admitting: Urology

## 2019-02-06 ENCOUNTER — Ambulatory Visit: Payer: PPO

## 2019-02-06 ENCOUNTER — Encounter
Admission: RE | Disposition: A | Discharge status: Home / Self Care | Payer: Self-pay | Source: Home / Self Care | Attending: Urology

## 2019-02-06 ENCOUNTER — Other Ambulatory Visit: Payer: Self-pay

## 2019-02-06 ENCOUNTER — Ambulatory Visit
Admission: RE | Admit: 2019-02-06 | Discharge: 2019-02-06 | Disposition: A | Discharge status: Home / Self Care | Payer: PPO | Attending: Urology | Admitting: Urology

## 2019-02-06 DIAGNOSIS — I4891 Unspecified atrial fibrillation: Secondary | ICD-10-CM | POA: Insufficient documentation

## 2019-02-06 DIAGNOSIS — R310 Gross hematuria: Secondary | ICD-10-CM

## 2019-02-06 DIAGNOSIS — Z951 Presence of aortocoronary bypass graft: Secondary | ICD-10-CM | POA: Insufficient documentation

## 2019-02-06 DIAGNOSIS — I10 Essential (primary) hypertension: Secondary | ICD-10-CM | POA: Insufficient documentation

## 2019-02-06 DIAGNOSIS — I252 Old myocardial infarction: Secondary | ICD-10-CM | POA: Diagnosis not present

## 2019-02-06 DIAGNOSIS — N35919 Unspecified urethral stricture, male, unspecified site: Secondary | ICD-10-CM | POA: Insufficient documentation

## 2019-02-06 DIAGNOSIS — E039 Hypothyroidism, unspecified: Secondary | ICD-10-CM | POA: Diagnosis not present

## 2019-02-06 DIAGNOSIS — Z539 Procedure and treatment not carried out, unspecified reason: Secondary | ICD-10-CM | POA: Diagnosis not present

## 2019-02-06 DIAGNOSIS — N35911 Unspecified urethral stricture, male, meatal: Secondary | ICD-10-CM

## 2019-02-06 DIAGNOSIS — R319 Hematuria, unspecified: Secondary | ICD-10-CM | POA: Diagnosis not present

## 2019-02-06 DIAGNOSIS — I2510 Atherosclerotic heart disease of native coronary artery without angina pectoris: Secondary | ICD-10-CM | POA: Diagnosis not present

## 2019-02-06 DIAGNOSIS — D494 Neoplasm of unspecified behavior of bladder: Secondary | ICD-10-CM | POA: Insufficient documentation

## 2019-02-06 DIAGNOSIS — N3289 Other specified disorders of bladder: Secondary | ICD-10-CM

## 2019-02-06 SURGERY — TRANSURETHRAL RESECTION OF BLADDER TUMOR WITH MITOMYCIN-C
Anesthesia: General

## 2019-02-06 MED ORDER — PROPOFOL 10 MG/ML IV BOLUS
INTRAVENOUS | Status: AC
  Filled 2019-02-06: qty 20

## 2019-02-06 MED ORDER — SODIUM CHLORIDE 0.9 % IV SOLN: Freq: Once | INTRAVENOUS | Status: DC

## 2019-02-06 MED ORDER — CEFAZOLIN SODIUM-DEXTROSE 2-4 GM/100ML-% IV SOLN: 2.0000 g | INTRAVENOUS | Status: DC

## 2019-02-06 MED ORDER — CEFAZOLIN SODIUM-DEXTROSE 2-4 GM/100ML-% IV SOLN
INTRAVENOUS | Status: AC
  Filled 2019-02-06: qty 100

## 2019-02-06 MED ORDER — FAMOTIDINE 20 MG PO TABS
20.0000 mg | ORAL_TABLET | Freq: Once | ORAL | Status: AC
  Administered 2019-02-06: 13:00:00 20 mg via ORAL

## 2019-02-06 MED ORDER — LACTATED RINGERS IV SOLN
INTRAVENOUS | Status: DC
  Administered 2019-02-06: 50 mL/h via INTRAVENOUS

## 2019-02-06 MED ORDER — FENTANYL CITRATE (PF) 100 MCG/2ML IJ SOLN
INTRAMUSCULAR | Status: AC
  Filled 2019-02-06: qty 2

## 2019-02-06 MED ORDER — LACTATED RINGERS IV SOLN
Freq: Once | INTRAVENOUS | Status: AC
  Administered 2019-02-06: 14:00:00 500 mL/h via INTRAVENOUS

## 2019-02-06 MED ORDER — FAMOTIDINE 20 MG PO TABS
ORAL_TABLET | ORAL | Status: AC
  Administered 2019-02-06: 20 mg via ORAL
  Filled 2019-02-06: qty 1

## 2019-02-06 MED ORDER — LIDOCAINE HCL (PF) 2 % IJ SOLN
INTRAMUSCULAR | Status: AC
  Filled 2019-02-06: qty 10

## 2019-02-06 SURGICAL SUPPLY — 50 items
BAG DRAIN CYSTO-URO LG1000N (MISCELLANEOUS) ×4 IMPLANT
BAG URINE DRAIN 2000ML AR STRL (UROLOGICAL SUPPLIES) ×4 IMPLANT
BLADE SURG 15 STRL LF DISP TIS (BLADE) ×2 IMPLANT
BLADE SURG 15 STRL SS (BLADE) ×2
BNDG CONFORM 2 STRL LF (GAUZE/BANDAGES/DRESSINGS) ×4 IMPLANT
BRUSH SCRUB EZ  4% CHG (MISCELLANEOUS) ×2
BRUSH SCRUB EZ 4% CHG (MISCELLANEOUS) ×2 IMPLANT
CANISTER SUCT 1200ML W/VALVE (MISCELLANEOUS) ×4 IMPLANT
CATH FOLEY 2WAY  5CC 16FR (CATHETERS) ×2
CATH URETL 5X70 OPEN END (CATHETERS) ×4 IMPLANT
CATH URTH 16FR FL 2W BLN LF (CATHETERS) ×2 IMPLANT
CHLORAPREP W/TINT 26 (MISCELLANEOUS) ×4 IMPLANT
COVER WAND RF STERILE (DRAPES) ×4 IMPLANT
CRADLE LAMINECT ARM (MISCELLANEOUS) ×4 IMPLANT
DRAPE LAPAROTOMY 77X122 PED (DRAPES) ×4 IMPLANT
DRAPE UTILITY 15X26 TOWEL STRL (DRAPES) ×4 IMPLANT
DRSG TELFA 4X3 1S NADH ST (GAUZE/BANDAGES/DRESSINGS) ×4 IMPLANT
ELECT CAUTERY NEEDLE TIP 1.0 (MISCELLANEOUS) ×4
ELECT LOOP 22F BIPOLAR SML (ELECTROSURGICAL)
ELECT REM PT RETURN 9FT ADLT (ELECTROSURGICAL) ×4
ELECTRODE CAUTERY NEDL TIP 1.0 (MISCELLANEOUS) ×2 IMPLANT
ELECTRODE LOOP 22F BIPOLAR SML (ELECTROSURGICAL) IMPLANT
ELECTRODE REM PT RTRN 9FT ADLT (ELECTROSURGICAL) ×2 IMPLANT
GAUZE PETROLATUM 1 X8 (GAUZE/BANDAGES/DRESSINGS) ×4 IMPLANT
GLOVE BIO SURGEON STRL SZ 6.5 (GLOVE) ×3 IMPLANT
GLOVE BIO SURGEONS STRL SZ 6.5 (GLOVE) ×1
GOWN STRL REUS W/ TWL LRG LVL3 (GOWN DISPOSABLE) ×4 IMPLANT
GOWN STRL REUS W/TWL LRG LVL3 (GOWN DISPOSABLE) ×4
GUIDEWIRE STR DUAL SENSOR (WIRE) ×4 IMPLANT
KIT TURNOVER CYSTO (KITS) ×4 IMPLANT
KIT TURNOVER KIT A (KITS) ×4 IMPLANT
LABEL OR SOLS (LABEL) ×4 IMPLANT
LOOP CUT BIPOLAR 24F LRG (ELECTROSURGICAL) IMPLANT
NDL SAFETY ECLIPSE 18X1.5 (NEEDLE) ×2 IMPLANT
NEEDLE HYPO 18GX1.5 SHARP (NEEDLE) ×2
NEEDLE HYPO 25X1 1.5 SAFETY (NEEDLE) ×4 IMPLANT
NS IRRIG 500ML POUR BTL (IV SOLUTION) ×4 IMPLANT
PACK BASIN MINOR ARMC (MISCELLANEOUS) ×4 IMPLANT
PACK CYSTO AR (MISCELLANEOUS) ×4 IMPLANT
SET CYSTO W/LG BORE CLAMP LF (SET/KITS/TRAYS/PACK) ×4 IMPLANT
SET IRRIG Y TYPE TUR BLADDER L (SET/KITS/TRAYS/PACK) ×4 IMPLANT
SOL .9 NS 3000ML IRR  AL (IV SOLUTION) ×2
SOL .9 NS 3000ML IRR UROMATIC (IV SOLUTION) ×2 IMPLANT
SOL PREP PVP 2OZ (MISCELLANEOUS) ×4
SOLUTION PREP PVP 2OZ (MISCELLANEOUS) ×2 IMPLANT
SURGILUBE 2OZ TUBE FLIPTOP (MISCELLANEOUS) ×4 IMPLANT
SUT MNCRL AB 4-0 PS2 18 (SUTURE) ×12 IMPLANT
SYR 10ML LL (SYRINGE) ×4 IMPLANT
SYRINGE IRR TOOMEY STRL 70CC (SYRINGE) ×4 IMPLANT
WATER STERILE IRR 1000ML POUR (IV SOLUTION) ×4 IMPLANT

## 2019-02-06 NOTE — Telephone Encounter (Signed)
This patient was markedly hypertensive in the preoperative holding area.  Notably, his blood pressure was in the 210/110s blood pressure range with heart rate in the 60s.  He presumably had taken his metoprolol but his blood pressure was not well controlled.  He was otherwise asymptomatic.  After discussion with anesthesia, we elected to cancel the case today with a goal of achieving better blood pressure control.  There is concern for perioperative morbidity/mortality given his very high blood pressure and advanced age.  The patient and his son are agreeable this plan.  Plan to reschedule him in a few weeks once his blood pressure has been adequately controlled.  Please help arrange follow-up with his PCP or cardiology ASAP.  Hollice Espy, MD

## 2019-02-06 NOTE — Anesthesia Preprocedure Evaluation (Addendum)
Anesthesia Evaluation  Patient identified by MRN, date of birth, ID band Patient awake    Reviewed: Allergy & Precautions, H&P , NPO status , Patient's Chart, lab work & pertinent test results  Airway Mallampati: II  TM Distance: >3 FB Neck ROM: limited    Dental  (+) Chipped   Pulmonary neg pulmonary ROS,           Cardiovascular hypertension, On Medications and On Home Beta Blockers (-) angina+ CAD, + Past MI (1995) and + CABG (1995)  (-) dysrhythmias      Neuro/Psych negative neurological ROS  negative psych ROS   GI/Hepatic negative GI ROS, Neg liver ROS,   Endo/Other  Hypothyroidism   Renal/GU      Musculoskeletal   Abdominal   Peds  Hematology negative hematology ROS (+)   Anesthesia Other Findings Past Medical History: No date: A-fib ([facility]) No date: Coronary artery disease No date: Hypertension No date: Hypothyroidism No date: Myocardial infarction ([place]) No date: Thyroid disease  Past Surgical History: 1995: CARDIAC SURGERY     Comment:  CABG x 3 No date: CATARACT EXTRACTION, BILATERAL 1995: CORONARY ARTERY BYPASS GRAFT     Comment:  triple No date: HERNIA REPAIR; Bilateral     Reproductive/Obstetrics negative OB ROS                            Anesthesia Physical Anesthesia Plan  ASA: II  Anesthesia Plan: General LMA   Post-op Pain Management:    Induction:   PONV Risk Score and Plan: Ondansetron, Dexamethasone and Treatment may vary due to age or medical condition  Airway Management Planned:   Additional Equipment:   Intra-op Plan:   Post-operative Plan:   Informed Consent: I have reviewed the patients History and Physical, chart, labs and discussed the procedure including the risks, benefits and alternatives for the proposed anesthesia with the patient or authorized representative who has indicated his/her understanding and acceptance.     Dental  Advisory Given  Plan Discussed with: Anesthesiologist  Anesthesia Plan Comments:        Anesthesia Quick Evaluation

## 2019-02-06 NOTE — OR Nursing (Signed)
Pt. intal BP 196/100 repeat BP 202/114. Dr. Ola Spurr notified and advised she would come evaluate pt.

## 2019-02-07 ENCOUNTER — Other Ambulatory Visit: Payer: Self-pay | Admitting: Radiology

## 2019-02-07 DIAGNOSIS — M5441 Lumbago with sciatica, right side: Secondary | ICD-10-CM | POA: Diagnosis not present

## 2019-02-07 DIAGNOSIS — N35911 Unspecified urethral stricture, male, meatal: Secondary | ICD-10-CM

## 2019-02-07 DIAGNOSIS — R310 Gross hematuria: Secondary | ICD-10-CM

## 2019-02-07 DIAGNOSIS — N3289 Other specified disorders of bladder: Secondary | ICD-10-CM

## 2019-02-07 DIAGNOSIS — M545 Low back pain: Secondary | ICD-10-CM | POA: Diagnosis not present

## 2019-02-07 MED ORDER — SODIUM CHLORIDE 0.9 % IR SOLN: 2000.0000 mg | Freq: Once | Status: DC

## 2019-02-13 ENCOUNTER — Other Ambulatory Visit: Payer: PPO

## 2019-02-13 ENCOUNTER — Other Ambulatory Visit: Payer: Self-pay

## 2019-02-13 ENCOUNTER — Telehealth: Payer: Self-pay | Admitting: Radiology

## 2019-02-13 ENCOUNTER — Other Ambulatory Visit: Payer: Self-pay | Admitting: Radiology

## 2019-02-13 DIAGNOSIS — Z5181 Encounter for therapeutic drug level monitoring: Secondary | ICD-10-CM | POA: Diagnosis not present

## 2019-02-13 DIAGNOSIS — N3289 Other specified disorders of bladder: Secondary | ICD-10-CM

## 2019-02-13 DIAGNOSIS — N35911 Unspecified urethral stricture, male, meatal: Secondary | ICD-10-CM | POA: Diagnosis not present

## 2019-02-13 DIAGNOSIS — M25551 Pain in right hip: Secondary | ICD-10-CM | POA: Diagnosis not present

## 2019-02-13 DIAGNOSIS — I129 Hypertensive chronic kidney disease with stage 1 through stage 4 chronic kidney disease, or unspecified chronic kidney disease: Secondary | ICD-10-CM | POA: Diagnosis not present

## 2019-02-13 DIAGNOSIS — N183 Chronic kidney disease, stage 3 unspecified: Secondary | ICD-10-CM | POA: Diagnosis not present

## 2019-02-13 DIAGNOSIS — Z7901 Long term (current) use of anticoagulants: Secondary | ICD-10-CM | POA: Diagnosis not present

## 2019-02-13 DIAGNOSIS — M1611 Unilateral primary osteoarthritis, right hip: Secondary | ICD-10-CM | POA: Diagnosis not present

## 2019-02-13 DIAGNOSIS — I482 Chronic atrial fibrillation, unspecified: Secondary | ICD-10-CM | POA: Diagnosis not present

## 2019-02-13 LAB — URINALYSIS, COMPLETE
Bilirubin, UA: NEGATIVE
Glucose, UA: NEGATIVE
Ketones, UA: NEGATIVE
Leukocytes,UA: NEGATIVE
Nitrite, UA: NEGATIVE
Protein,UA: NEGATIVE
Specific Gravity, UA: 1.025 (ref 1.005–1.030)
Urobilinogen, Ur: 4 mg/dL — ABNORMAL HIGH (ref 0.2–1.0)
pH, UA: 7 (ref 5.0–7.5)

## 2019-02-13 LAB — MICROSCOPIC EXAMINATION: Bacteria, UA: NONE SEEN

## 2019-02-13 NOTE — Telephone Encounter (Signed)
Advised patient's son that back pain is not related to bladder lesion and that he will need to work with his primary care physician regarding this. Questions answered. Son expresses understanding of conversation.

## 2019-02-13 NOTE — Telephone Encounter (Signed)
As discussed previously in the preoperative holding area, this is nothing to do with his bladder lesion.  He has had right back pain and sciatica rating down his leg.  He needs close primary care for management of this.  Hollice Espy, MD

## 2019-02-13 NOTE — Telephone Encounter (Signed)
Patient reports continued lower right back pain. He questions whether this has something to do with his bladder mass and if he can be given something for pain. Please advise.

## 2019-02-14 MED ORDER — FENTANYL CITRATE (PF) 100 MCG/2ML IJ SOLN: 25.0000 ug | INTRAMUSCULAR | Status: DC | PRN

## 2019-02-15 LAB — CULTURE, URINE COMPREHENSIVE

## 2019-02-16 ENCOUNTER — Other Ambulatory Visit: Payer: Self-pay

## 2019-02-16 ENCOUNTER — Other Ambulatory Visit
Admission: RE | Admit: 2019-02-16 | Discharge: 2019-02-16 | Disposition: A | Discharge status: Home / Self Care | Payer: PPO | Source: Ambulatory Visit | Attending: Urology | Admitting: Urology

## 2019-02-16 DIAGNOSIS — Z01812 Encounter for preprocedural laboratory examination: Secondary | ICD-10-CM | POA: Insufficient documentation

## 2019-02-16 DIAGNOSIS — N183 Chronic kidney disease, stage 3 unspecified: Secondary | ICD-10-CM | POA: Diagnosis not present

## 2019-02-16 DIAGNOSIS — I129 Hypertensive chronic kidney disease with stage 1 through stage 4 chronic kidney disease, or unspecified chronic kidney disease: Secondary | ICD-10-CM | POA: Diagnosis not present

## 2019-02-16 DIAGNOSIS — Z20828 Contact with and (suspected) exposure to other viral communicable diseases: Secondary | ICD-10-CM | POA: Diagnosis not present

## 2019-02-16 LAB — SARS CORONAVIRUS 2 (TAT 6-24 HRS): SARS Coronavirus 2: NEGATIVE

## 2019-02-20 ENCOUNTER — Ambulatory Visit: Payer: PPO | Admitting: Anesthesiology

## 2019-02-20 ENCOUNTER — Encounter: Payer: Self-pay | Admitting: Anesthesiology

## 2019-02-20 ENCOUNTER — Ambulatory Visit: Payer: PPO

## 2019-02-20 ENCOUNTER — Ambulatory Visit
Admission: RE | Admit: 2019-02-20 | Discharge: 2019-02-20 | Disposition: A | Discharge status: Home / Self Care | Payer: PPO | Attending: Urology | Admitting: Urology

## 2019-02-20 ENCOUNTER — Encounter
Admission: RE | Disposition: A | Discharge status: Home / Self Care | Payer: Self-pay | Source: Home / Self Care | Attending: Urology

## 2019-02-20 DIAGNOSIS — I2510 Atherosclerotic heart disease of native coronary artery without angina pectoris: Secondary | ICD-10-CM | POA: Insufficient documentation

## 2019-02-20 DIAGNOSIS — N35911 Unspecified urethral stricture, male, meatal: Secondary | ICD-10-CM | POA: Diagnosis not present

## 2019-02-20 DIAGNOSIS — I252 Old myocardial infarction: Secondary | ICD-10-CM | POA: Insufficient documentation

## 2019-02-20 DIAGNOSIS — E785 Hyperlipidemia, unspecified: Secondary | ICD-10-CM | POA: Diagnosis not present

## 2019-02-20 DIAGNOSIS — I10 Essential (primary) hypertension: Secondary | ICD-10-CM | POA: Insufficient documentation

## 2019-02-20 DIAGNOSIS — C672 Malignant neoplasm of lateral wall of bladder: Secondary | ICD-10-CM | POA: Insufficient documentation

## 2019-02-20 DIAGNOSIS — I4891 Unspecified atrial fibrillation: Secondary | ICD-10-CM | POA: Diagnosis not present

## 2019-02-20 DIAGNOSIS — Z7901 Long term (current) use of anticoagulants: Secondary | ICD-10-CM | POA: Insufficient documentation

## 2019-02-20 DIAGNOSIS — Z7689 Persons encountering health services in other specified circumstances: Secondary | ICD-10-CM | POA: Diagnosis not present

## 2019-02-20 DIAGNOSIS — N3289 Other specified disorders of bladder: Secondary | ICD-10-CM | POA: Diagnosis not present

## 2019-02-20 DIAGNOSIS — R310 Gross hematuria: Secondary | ICD-10-CM | POA: Diagnosis not present

## 2019-02-20 DIAGNOSIS — N35811 Other urethral stricture, male, meatal: Secondary | ICD-10-CM | POA: Diagnosis not present

## 2019-02-20 DIAGNOSIS — E039 Hypothyroidism, unspecified: Secondary | ICD-10-CM | POA: Diagnosis not present

## 2019-02-20 DIAGNOSIS — D494 Neoplasm of unspecified behavior of bladder: Secondary | ICD-10-CM | POA: Diagnosis not present

## 2019-02-20 HISTORY — PX: CYSTOSCOPY W/ RETROGRADES: SHX1426

## 2019-02-20 HISTORY — PX: MEATOTOMY: SHX5133

## 2019-02-20 HISTORY — PX: TRANSURETHRAL RESECTION OF BLADDER TUMOR WITH MITOMYCIN-C: SHX6459

## 2019-02-20 LAB — PROTIME-INR
INR: 1.2 (ref 0.8–1.2)
Prothrombin Time: 15.4 seconds — ABNORMAL HIGH (ref 11.4–15.2)

## 2019-02-20 SURGERY — TRANSURETHRAL RESECTION OF BLADDER TUMOR WITH MITOMYCIN-C
Anesthesia: General | Site: Ureter

## 2019-02-20 MED ORDER — DEXAMETHASONE SODIUM PHOSPHATE 10 MG/ML IJ SOLN
INTRAMUSCULAR | Status: DC | PRN
  Administered 2019-02-20: 5 mg via INTRAVENOUS

## 2019-02-20 MED ORDER — CEFAZOLIN SODIUM-DEXTROSE 2-4 GM/100ML-% IV SOLN
2.0000 g | INTRAVENOUS | Status: AC
  Administered 2019-02-20: 2 g via INTRAVENOUS

## 2019-02-20 MED ORDER — LACTATED RINGERS IV SOLN
INTRAVENOUS | Status: DC
  Administered 2019-02-20: 13:00:00 via INTRAVENOUS

## 2019-02-20 MED ORDER — GLYCOPYRROLATE 0.2 MG/ML IJ SOLN
INTRAMUSCULAR | Status: DC | PRN
  Administered 2019-02-20: 0.2 mg via INTRAVENOUS

## 2019-02-20 MED ORDER — ONDANSETRON HCL 4 MG/2ML IJ SOLN
INTRAMUSCULAR | Status: DC | PRN
  Administered 2019-02-20: 4 mg via INTRAVENOUS

## 2019-02-20 MED ORDER — PROPOFOL 10 MG/ML IV BOLUS
INTRAVENOUS | Status: DC | PRN
  Administered 2019-02-20: 100 mg via INTRAVENOUS
  Administered 2019-02-20 (×2): 20 mg via INTRAVENOUS

## 2019-02-20 MED ORDER — FENTANYL CITRATE (PF) 100 MCG/2ML IJ SOLN
INTRAMUSCULAR | Status: DC | PRN
  Administered 2019-02-20: 25 ug via INTRAVENOUS
  Administered 2019-02-20: 50 ug via INTRAVENOUS
  Administered 2019-02-20: 25 ug via INTRAVENOUS

## 2019-02-20 MED ORDER — PROPOFOL 10 MG/ML IV BOLUS
INTRAVENOUS | Status: AC
  Filled 2019-02-20: qty 20

## 2019-02-20 MED ORDER — ONDANSETRON HCL 4 MG/2ML IJ SOLN: 4.0000 mg | Freq: Once | INTRAMUSCULAR | Status: DC | PRN

## 2019-02-20 MED ORDER — BUPIVACAINE HCL 0.5 % IJ SOLN
INTRAMUSCULAR | Status: DC | PRN
  Administered 2019-02-20: 30 mL

## 2019-02-20 MED ORDER — IOHEXOL 180 MG/ML  SOLN
INTRAMUSCULAR | Status: DC | PRN
  Administered 2019-02-20: 12 mL

## 2019-02-20 MED ORDER — BACITRACIN ZINC 500 UNIT/GM EX OINT
TOPICAL_OINTMENT | CUTANEOUS | Status: AC
  Filled 2019-02-20: qty 28.35

## 2019-02-20 MED ORDER — CEFAZOLIN SODIUM-DEXTROSE 2-4 GM/100ML-% IV SOLN
INTRAVENOUS | Status: AC
  Filled 2019-02-20: qty 100

## 2019-02-20 MED ORDER — BACITRACIN ZINC 500 UNIT/GM EX OINT
TOPICAL_OINTMENT | CUTANEOUS | Status: DC | PRN
  Administered 2019-02-20: 1 via TOPICAL

## 2019-02-20 MED ORDER — GEMCITABINE CHEMO FOR BLADDER INSTILLATION 2000 MG
INTRAVENOUS | Status: DC | PRN
  Administered 2019-02-20: 2000 mg via INTRAVESICAL

## 2019-02-20 MED ORDER — FENTANYL CITRATE (PF) 100 MCG/2ML IJ SOLN
INTRAMUSCULAR | Status: AC
  Filled 2019-02-20: qty 2

## 2019-02-20 MED ORDER — BUPIVACAINE HCL (PF) 0.5 % IJ SOLN
INTRAMUSCULAR | Status: AC
  Filled 2019-02-20: qty 30

## 2019-02-20 MED ORDER — FENTANYL CITRATE (PF) 100 MCG/2ML IJ SOLN: 25.0000 ug | INTRAMUSCULAR | Status: DC | PRN

## 2019-02-20 MED ORDER — ONDANSETRON HCL 4 MG/2ML IJ SOLN
INTRAMUSCULAR | Status: AC
  Filled 2019-02-20: qty 2

## 2019-02-20 MED ORDER — FAMOTIDINE 20 MG PO TABS
ORAL_TABLET | ORAL | Status: AC
  Administered 2019-02-20: 20 mg via ORAL
  Filled 2019-02-20: qty 1

## 2019-02-20 MED ORDER — FAMOTIDINE 20 MG PO TABS
20.0000 mg | ORAL_TABLET | Freq: Once | ORAL | Status: AC
  Administered 2019-02-20: 13:00:00 20 mg via ORAL

## 2019-02-20 SURGICAL SUPPLY — 52 items
BAG DRAIN CYSTO-URO LG1000N (MISCELLANEOUS) ×5 IMPLANT
BAG URINE DRAIN 2000ML AR STRL (UROLOGICAL SUPPLIES) ×5 IMPLANT
BLADE SURG 15 STRL LF DISP TIS (BLADE) ×3 IMPLANT
BLADE SURG 15 STRL SS (BLADE) ×2
BNDG CONFORM 2 STRL LF (GAUZE/BANDAGES/DRESSINGS) ×5 IMPLANT
BRUSH SCRUB EZ  4% CHG (MISCELLANEOUS) ×2
BRUSH SCRUB EZ 4% CHG (MISCELLANEOUS) ×3 IMPLANT
CANISTER SUCT 1200ML W/VALVE (MISCELLANEOUS) ×5 IMPLANT
CATH COUDE FOLEY 2W 5CC 18FR (CATHETERS) ×5 IMPLANT
CATH FOLEY 2WAY  5CC 16FR (CATHETERS)
CATH URETL 5X70 OPEN END (CATHETERS) ×5 IMPLANT
CATH URTH 16FR FL 2W BLN LF (CATHETERS) IMPLANT
CHLORAPREP W/TINT 26 (MISCELLANEOUS) ×5 IMPLANT
COVER WAND RF STERILE (DRAPES) IMPLANT
CRADLE LAMINECT ARM (MISCELLANEOUS) ×5 IMPLANT
DRAPE LAPAROTOMY 77X122 PED (DRAPES) IMPLANT
DRAPE UTILITY 15X26 TOWEL STRL (DRAPES) ×5 IMPLANT
DRSG TELFA 4X3 1S NADH ST (GAUZE/BANDAGES/DRESSINGS) ×5 IMPLANT
ELECT CAUTERY NEEDLE TIP 1.0 (MISCELLANEOUS) ×5
ELECT LOOP 22F BIPOLAR SML (ELECTROSURGICAL) ×5
ELECT REM PT RETURN 9FT ADLT (ELECTROSURGICAL) ×5
ELECTRODE CAUTERY NEDL TIP 1.0 (MISCELLANEOUS) ×3 IMPLANT
ELECTRODE LOOP 22F BIPOLAR SML (ELECTROSURGICAL) ×3 IMPLANT
ELECTRODE REM PT RTRN 9FT ADLT (ELECTROSURGICAL) ×3 IMPLANT
GAUZE PETROLATUM 1 X8 (GAUZE/BANDAGES/DRESSINGS) ×5 IMPLANT
GLOVE BIO SURGEON STRL SZ 6.5 (GLOVE) ×8 IMPLANT
GLOVE BIO SURGEONS STRL SZ 6.5 (GLOVE) ×2
GOWN STRL REUS W/ TWL LRG LVL3 (GOWN DISPOSABLE) ×6 IMPLANT
GOWN STRL REUS W/TWL LRG LVL3 (GOWN DISPOSABLE) ×4
GUIDEWIRE STR DUAL SENSOR (WIRE) ×5 IMPLANT
KIT TURNOVER CYSTO (KITS) ×5 IMPLANT
KIT TURNOVER KIT A (KITS) ×5 IMPLANT
LABEL OR SOLS (LABEL) ×5 IMPLANT
LOOP CUT BIPOLAR 24F LRG (ELECTROSURGICAL) IMPLANT
NDL SAFETY ECLIPSE 18X1.5 (NEEDLE) ×3 IMPLANT
NEEDLE HYPO 18GX1.5 SHARP (NEEDLE) ×2
NEEDLE HYPO 25X1 1.5 SAFETY (NEEDLE) ×5 IMPLANT
NS IRRIG 500ML POUR BTL (IV SOLUTION) ×5 IMPLANT
PACK BASIN MINOR ARMC (MISCELLANEOUS) IMPLANT
PACK CYSTO AR (MISCELLANEOUS) ×5 IMPLANT
SET CYSTO W/LG BORE CLAMP LF (SET/KITS/TRAYS/PACK) ×5 IMPLANT
SET IRRIG Y TYPE TUR BLADDER L (SET/KITS/TRAYS/PACK) ×5 IMPLANT
SOL .9 NS 3000ML IRR  AL (IV SOLUTION) ×2
SOL .9 NS 3000ML IRR UROMATIC (IV SOLUTION) ×3 IMPLANT
SOL PREP PVP 2OZ (MISCELLANEOUS) ×5
SOLUTION PREP PVP 2OZ (MISCELLANEOUS) ×3 IMPLANT
SURGILUBE 2OZ TUBE FLIPTOP (MISCELLANEOUS) ×5 IMPLANT
SUT CHROMIC 4 0 RB 1X27 (SUTURE) ×5 IMPLANT
SUT MNCRL AB 4-0 PS2 18 (SUTURE) IMPLANT
SYR 10ML LL (SYRINGE) ×5 IMPLANT
SYRINGE IRR TOOMEY STRL 70CC (SYRINGE) ×5 IMPLANT
WATER STERILE IRR 1000ML POUR (IV SOLUTION) ×5 IMPLANT

## 2019-02-20 NOTE — Discharge Instructions (Addendum)
You may resume your Coumadin and aspirin once  your urine has cleared.   Transurethral Resection of Bladder Tumor (TURBT) or Bladder Biopsy   Definition:  Transurethral Resection of the Bladder Tumor is a surgical procedure used to diagnose and remove tumors within the bladder. TURBT is the most common treatment for early stage bladder cancer.  General instructions:     Your recent bladder surgery requires very little post hospital care but some definite precautions.  Despite the fact that no skin incisions were used, the area around the bladder incisions are raw and covered with scabs to promote healing and prevent bleeding. Certain precautions are needed to insure that the scabs are not disturbed over the next 2-4 weeks while the healing proceeds.  Because the raw surface inside your bladder and the irritating effects of urine you may expect frequency of urination and/or urgency (a stronger desire to urinate) and perhaps even getting up at night more often. This will usually resolve or improve slowly over the healing period. You may see some blood in your urine over the first 6 weeks. Do not be alarmed, even if the urine was clear for a while. Get off your feet and drink lots of fluids until clearing occurs. If you start to pass clots or don't improve call us.  Diet:  You may return to your normal diet immediately. Because of the raw surface of your bladder, alcohol, spicy foods, foods high in acid and drinks with caffeine may cause irritation or frequency and should be used in moderation. To keep your urine flowing freely and avoid constipation, drink plenty of fluids during the day (8-10 glasses). Tip: Avoid cranberry juice because it is very acidic.  Activity:  Your physical activity doesn't need to be restricted. However, if you are very active, you may see some blood in the urine. We suggest that you reduce your activity under the circumstances until the bleeding has  stopped.  Bowels:  It is important to keep your bowels regular during the postoperative period. Straining with bowel movements can cause bleeding. A bowel movement every other day is reasonable. Use a mild laxative if needed, such as milk of magnesia 2-3 tablespoons, or 2 Dulcolax tablets. Call if you continue to have problems. If you had been taking narcotics for pain, before, during or after your surgery, you may be constipated. Take a laxative if necessary.    Medication:  You should resume your pre-surgery medications unless told not to. In addition you may be given an antibiotic to prevent or treat infection. Antibiotics are not always necessary. All medication should be taken as prescribed until the bottles are finished unless you are having an unusual reaction to one of the drugs.   [place], [place] [id number] [phone]       Indwelling Urinary Catheter Care, Adult An indwelling urinary catheter is a thin tube that is put into your bladder. The tube helps to drain pee (urine) out of your body. The tube goes in through your urethra. Your urethra is where pee comes out of your body. Your pee will come out through the catheter, then it will go into a bag (drainage bag). Take good care of your catheter so it will work well. How to wear your catheter and bag Supplies needed  Sticky tape (adhesive tape) or a leg strap.  Alcohol wipe or soap and water (if you use tape).  A clean towel (if you use tape).  Large overnight bag.  Smaller bag (leg bag). Wearing your catheter Attach your catheter to your leg with tape or a leg strap.  Make sure the catheter is not pulled tight.  If a leg strap gets wet, take it off and put on a dry strap.  If you use tape to hold the bag on your leg: 1. Use an alcohol wipe or soap and water to wash your skin where the tape made it sticky before. 2. Use a clean towel to pat-dry that skin. 3. Use new tape to make  the bag stay on your leg. Wearing your bags You should have been given a large overnight bag.  You may wear the overnight bag in the day or night.  Always have the overnight bag lower than your bladder.  Do not let the bag touch the floor.  Before you go to sleep, put a clean plastic bag in a wastebasket. Then hang the overnight bag inside the wastebasket. You should also have a smaller leg bag that fits under your clothes.  Always wear the leg bag below your knee.  Do not wear your leg bag at night. How to care for your skin and catheter Supplies needed  A clean washcloth.  Water and mild soap.  A clean towel. Caring for your skin and catheter      Clean the skin around your catheter every day: ? Wash your hands with soap and water. ? Wet a clean washcloth in warm water and mild soap. ? Clean the skin around your urethra. ? If you are male: ? Gently spread the folds of skin around your vagina (labia). ? With the washcloth in your other hand, wipe the inner side of your labia on each side. Wipe from front to back. ? If you are male: ? Pull back any skin that covers the end of your penis (foreskin). ? With the washcloth in your other hand, wipe your penis in small circles. Start wiping at the tip of your penis, then move away from the catheter. ? Move the foreskin back in place, if needed. ? With your free hand, hold the catheter close to where it goes into your body. ? Keep holding the catheter during cleaning so it does not get pulled out. ? With the washcloth in your other hand, clean the catheter. ? Only wipe downward on the catheter. ? Do not wipe upward toward your body. Doing this may push germs into your urethra and cause infection. ? Use a clean towel to pat-dry the catheter and the skin around it. Make sure to wipe off all soap. ? Wash your hands with soap and water.  Shower every day. Do not take baths.  Do not use cream, ointment, or lotion on the area  where the catheter goes into your body, unless your doctor tells you to.  Do not use powders, sprays, or lotions on your genital area.  Check your skin around the catheter every day for signs of infection. Check for: ? Redness, swelling, or pain. ? Fluid or blood. ? Warmth. ? Pus or a bad smell. How to empty the bag Supplies needed  Rubbing alcohol.  Gauze pad or cotton ball.  Tape or a leg strap. Emptying the bag Pour the pee out of your bag when it is ?- full, or at least 2-3 times a day. Do this for your overnight bag and your leg bag. 1. Wash your hands with soap and water. 2. Separate (detach) the bag from your leg. 3.  Hold the bag over the toilet or a clean pail. Keep the bag lower than your hips and bladder. This is so the pee (urine) does not go back into the tube. 4. Open the pour spout. It is at the bottom of the bag. 5. Empty the pee into the toilet or pail. Do not let the pour spout touch any surface. 6. Put rubbing alcohol on a gauze pad or cotton ball. 7. Use the gauze pad or cotton ball to clean the pour spout. 8. Close the pour spout. 9. Attach the bag to your leg with tape or a leg strap. 10. Wash your hands with soap and water. Follow instructions for cleaning the drainage bag:  From the product maker.  As told by your doctor. How to change the bag Supplies needed  Alcohol wipes.  A clean bag.  Tape or a leg strap. Changing the bag Replace your bag when it starts to leak, smell bad, or look dirty. 1. Wash your hands with soap and water. 2. Separate the dirty bag from your leg. 3. Pinch the catheter with your fingers so that pee does not spill out. 4. Separate the catheter tube from the bag tube where these tubes connect (at the connection valve). Do not let the tubes touch any surface. 5. Clean the end of the catheter tube with an alcohol wipe. Use a different alcohol wipe to clean the end of the bag tube. 6. Connect the catheter tube to the tube of  the clean bag. 7. Attach the clean bag to your leg with tape or a leg strap. Do not make the bag tight on your leg. 8. Wash your hands with soap and water. General rules   Never pull on your catheter. Never try to take it out. Doing that can hurt you.  Always wash your hands before and after you touch your catheter or bag. Use a mild, fragrance-free soap. If you do not have soap and water, use hand sanitizer.  Always make sure there are no twists or bends (kinks) in the catheter tube.  Always make sure there are no leaks in the catheter or bag.  Drink enough fluid to keep your pee pale yellow.  Do not take baths, swim, or use a hot tub.  If you are male, wipe from front to back after you poop (have a bowel movement). Contact a doctor if:  Your pee is cloudy.  Your pee smells worse than usual.  Your catheter gets clogged.  Your catheter leaks.  Your bladder feels full. Get help right away if:  You have redness, swelling, or pain where the catheter goes into your body.  You have fluid, blood, pus, or a bad smell coming from the area where the catheter goes into your body.  Your skin feels warm where the catheter goes into your body.  You have a fever.  You have pain in your: ? Belly (abdomen). ? Legs. ? Lower back. ? Bladder.  You see blood in the catheter.  Your pee is pink or red.  You feel sick to your stomach (nauseous).  You throw up (vomit).  You have chills.  Your pee is not draining into the bag.  Your catheter gets pulled out. Summary  An indwelling urinary catheter is a thin tube that is placed into the bladder to help drain pee (urine) out of the body.  The catheter is placed into the part of the body that drains pee from the bladder (urethra).  Taking good care of your catheter will keep it working properly and help prevent problems.  Always wash your hands before and after touching your catheter or bag.  Never pull on your catheter or  try to take it out. This information is not intended to replace advice given to you by your health care provider. Make sure you discuss any questions you have with your health care provider. Document Released: 06/27/2012 Document Revised: 06/24/2018 Document Reviewed: 10/16/2016 Elsevier Patient Education  [place]   1) The drugs that you were given will stay in your system until tomorrow so for the next 24 hours you should not:  A) Drive an automobile B) Make any legal decisions C) Drink any alcoholic beverage   2) You may resume regular meals tomorrow.  Today it is better to start with liquids and gradually work up to solid foods.  You may eat anything you prefer, but it is better to start with liquids, then soup and crackers, and gradually work up to solid foods.   3) Please notify your doctor immediately if you have any unusual bleeding, trouble breathing, redness and pain at the surgery site, drainage, fever, or pain not relieved by medication.    4) Additional Instructions:    Please contact your physician with any problems or Same Day Surgery at [phone], Monday through Friday 6 am to 4 pm, or [place] at [facility] number at [phone].

## 2019-02-20 NOTE — Progress Notes (Addendum)
Dr. Erlene Quan notified of patient passing clot around the foley. Per Dr. Erlene Quan order, tension was placed on catheter with leg strap, gauze and bacitracin placed on penis, and 2 cc of NS added to foley catheter balloon for a total of 12 mL in the catheter balloon. Pt tolerated procedure well. Denies pain at this time.

## 2019-02-20 NOTE — Transfer of Care (Signed)
Immediate Anesthesia Transfer of Care Note  Patient: Carl Coffey  Procedure(s) Performed: TRANSURETHRAL RESECTION OF BLADDER TUMOR WITH Gemcitabine (N/A Bladder) CYSTOSCOPY WITH RETROGRADE PYELOGRAM (Bilateral Ureter) MEATOTOMY ADULT (N/A Penis)  Patient Location: PACU  Anesthesia Type:General  Level of Consciousness: sedated  Airway & Oxygen Therapy: Patient connected to face mask oxygen  Post-op Assessment: Post -op Vital signs reviewed and stable  Post vital signs: stable  Last Vitals:  Vitals Value Taken Time  BP 111/70 02/20/19 1732  Temp 36.9 C 02/20/19 1731  Pulse 69 02/20/19 1734  Resp 12 02/20/19 1734  SpO2 95 % 02/20/19 1734  Vitals shown include unvalidated device data.  Last Pain:  Vitals:   02/20/19 1303  TempSrc: Tympanic  PainSc: 0-No pain         Complications: No apparent anesthesia complications

## 2019-02-20 NOTE — Anesthesia Preprocedure Evaluation (Addendum)
Anesthesia Evaluation  Patient identified by MRN, date of birth, ID band Patient awake    Reviewed: Allergy & Precautions, NPO status , Patient's Chart, lab work & pertinent test results, reviewed documented beta blocker date and time   Airway Mallampati: III  TM Distance: >3 FB     Dental  (+) Chipped   Pulmonary           Cardiovascular hypertension, Pt. on medications and Pt. 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  Reproductive/Obstetrics                             Anesthesia Physical Anesthesia Plan  ASA: III  Anesthesia Plan: General   Post-op Pain Management:    Induction: Intravenous  PONV Risk Score and Plan:   Airway Management Planned: LMA  Additional Equipment:   Intra-op Plan:   Post-operative Plan: Extubation in OR  Informed Consent: I have reviewed the patients History and Physical, chart, labs and discussed the procedure including the risks, benefits and alternatives for the proposed anesthesia with the patient or authorized representative who has indicated his/her understanding and acceptance.       Plan Discussed with: CRNA  Anesthesia Plan Comments:        Anesthesia Quick Evaluation

## 2019-02-20 NOTE — Anesthesia Procedure Notes (Signed)
Procedure Name: LMA Insertion Performed by: Cherryl Babin, CRNA Pre-anesthesia Checklist: Patient identified, Patient being monitored, Timeout performed, Emergency Drugs available and Suction available Patient Re-evaluated:Patient Re-evaluated prior to induction Oxygen Delivery Method: Circle system utilized Preoxygenation: Pre-oxygenation with 100% oxygen Induction Type: IV induction Ventilation: Mask ventilation without difficulty LMA: LMA inserted LMA Size: 4.5 Tube type: Oral Number of attempts: 1 Placement Confirmation: positive ETCO2 and breath sounds checked- equal and bilateral Tube secured with: Tape Dental Injury: Teeth and Oropharynx as per pre-operative assessment        

## 2019-02-20 NOTE — Anesthesia Post-op Follow-up Note (Signed)
Anesthesia QCDR form completed.        

## 2019-02-20 NOTE — Progress Notes (Signed)
Patient foley catheter irrigated with 250 cc of NS. Urine red and small clots removed. Pt tolerated procedure well. Patient's son demonstrated how to irrigate foley. Pt son verbalized understanding.

## 2019-02-20 NOTE — Interval H&P Note (Signed)
History and Physical Interval Note:  02/20/2019 3:54 PM  Carl Coffey  has presented today for surgery, with the diagnosis of bladder mass, gross hematuria, meatal stenosis.  The various methods of treatment have been discussed with the patient and family. After consideration of risks, benefits and other options for treatment, the patient has consented to  Procedure(s): TRANSURETHRAL RESECTION OF BLADDER TUMOR WITH Gemcitabine (N/A) CYSTOSCOPY WITH RETROGRADE PYELOGRAM (Bilateral) MEATOTOMY ADULT (N/A) as a surgical intervention.  The patient's history has been reviewed, patient examined, no change in status, stable for surgery.  I have reviewed the patient's chart and labs.  Questions were answered to the patient's satisfaction.    RRR CTAB  Hollice Espy

## 2019-02-20 NOTE — Op Note (Signed)
Date of procedure: 02/20/19  Preoperative diagnosis:  1. Bladder tumor, approximately 2 cm 2. Urethral stricture 3. Meatal stenosis  Postoperative diagnosis:  1. Same as above  Procedure: 1. Urethral dilation  2. Meatotomy 3. Cystoscopy with bilateral retrograde pyelogram 4. TURBT, medium  Surgeon: Hollice Espy, MD  Anesthesia: General  Complications: None  Intraoperative findings: Significant meatal stenosis.  14 French bulbar urethral stricture which was easily passable with scope.  Trabeculated bladder with friable prostatic mucosa, slightly elevated bladder neck.  Bilateral trigger pyelogram normal.  Low-grade carpet-like appearance of a papillary tumor, approximately 2 cm on the left lateral bladder wall.  EBL: Minimal  Specimens: Bladder tumor  Drains: 18 French Foley catheter, coud type  Indication: Carl Coffey is a 85 y.o. patient with gross hematuria found to have left posterior bladder wall tumor along with meatal stenosis.  After reviewing the management options for treatment, he elected to proceed with the above surgical procedure(s). We have discussed the potential benefits and risks of the procedure, side effects of the proposed treatment, the likelihood of the patient achieving the goals of the procedure, and any potential problems that might occur during the procedure or recuperation. Informed consent has been obtained.  Description of procedure:  The patient was taken to the operating room and general anesthesia was induced.  The patient was placed in the dorsal lithotomy position, prepped and draped in the usual sterile fashion, and preoperative antibiotics were administered. A preoperative time-out was performed.   The urethral meatus was examined and noted to be quite small.  I used a small tenotomy clamp at the inferior apex of the urethral meatus to clamp and then incised using tenotomy scissors slightly to create a wider meatus.  This was then gently  serially dilated up to 58 [place] using male dilators.  I then advanced a 21 [place] scope to the level of the mid bulbar urethra where a second bulbar urethral stricture was encountered.  I was able to see the other side of this relatively short and nondense stricture thus I was able to advance my scope through this area without any difficulty into the friable prostate with elevated bladder neck.  The bladder was carefully inspected noted to be trabeculated.  There is a bladder tumor involving the left lateral bladder wall, carpeting-like appearance approximately 2 cm in greatest diameter.  No other tumors or lesions were identified.  Attention was first turned to the right ureter orifice which was cannulated using a 5 [place] open-ended ureteral catheter.  A just retrograde pyelogram was then performed which showed no hydroureteronephrosis or filling defects.  Attention was turned to the left ureter orifice of the same exact procedure was performed.  There are no filling defects appreciated.  There are some tortuosity in the left distal aspect of the ureter likely secondary to J hooking but no overt filling defects.  This was relatively normal in appearance.  Next, a 37 French resectoscope was brought in using the visual obturator into the bladder.  The small loop was used to completely resect the bladder tumor down to the muscularis which was seen.  The base of the tumor was then fulgurated.  Tumor chips were passed off the field as bladder tumor.  Careful and adequate hemostasis was achieved both of the tumor base as well as the bladder neck which was bleeding slightly secondary to friable prostatic mucosa.  Finally, attention was then turned back to the urethral meatus which was bleeding from the meatotomy.  I placed a total of five 4-0 chromic sutures along the inferior aspect of the meatus in interrupted fashion for hemostatic control.  An 31 French coud Foley catheter was then advanced into the bladder  and the balloon was filled with 10 cc of saline.  The bladder was irrigated several times and noted to be light pink without clots.  Patient was then clean dry, repositioned supine position, restrained seizure, taken the PACU in stable condition.  2000 mg of intravesical mitomycin was instilled into the bladder and allowed to dwell for 1 hour in the PACU.  This was well-tolerated.  I elected to leave the Foley catheter in place both because of the medial stenosis, urethral dilation, instrumentation, his age and relatively large friable prostate due to the concern for retention.  Will leave Foley catheter in place for 2 days and a voiding trial in the office on Wednesday.  Findings were discussed with his son in detail.  All questions were answered.  We will tentatively plan for cystoscopy in 3 months pending pathology.  Hollice Espy, M.D.

## 2019-02-21 ENCOUNTER — Encounter: Payer: Self-pay | Admitting: Urology

## 2019-02-21 NOTE — Anesthesia Postprocedure Evaluation (Signed)
Anesthesia Post Note  Patient: ABAS SGRO  Procedure(s) Performed: TRANSURETHRAL RESECTION OF BLADDER TUMOR WITH Gemcitabine (N/A Bladder) CYSTOSCOPY WITH RETROGRADE PYELOGRAM (Bilateral Ureter) MEATOTOMY ADULT (N/A Penis)  Patient location during evaluation: PACU Anesthesia Type: General Level of consciousness: awake and alert and oriented Pain management: pain level controlled Vital Signs Assessment: post-procedure vital signs reviewed and stable Respiratory status: spontaneous breathing Cardiovascular status: blood pressure returned to baseline Anesthetic complications: no     Last Vitals:  Vitals:   02/20/19 1846 02/20/19 1901  BP: (!) 149/77 (!) 166/97  Pulse: 61 70  Resp: 18 19  Temp:    SpO2: 94% 93%    Last Pain:  Vitals:   02/20/19 1831  TempSrc:   PainSc: 0-No pain                 Berkeley Vanaken

## 2019-02-21 NOTE — Progress Notes (Signed)
02/22/2019 1:32 PM   Carl Coffey 1921/07/17 [id number]  Referring provider: Kirk Ruths, MD [facility] [place],  Carl [place]offe[place] [id number]  Chief Complaint  Patient presents with  . Follow-up    HPI: [name] is a 85 year old gentleman who is status post urethral dilation, meatotomy, cystoscopy with bilateral retrograde pyelograms and TURBT on February 20, 2019 who presents today for a voiding trial.  Patient presented to Dr. Erlene Quan with a complaint of intermittent gross hematuria in 12/2018.  RUS in 01/2019 No hydronephrosis.  Bilateral renal cysts are noted.  Findings concerning for a 1.9 cm bladder mass along the left bladder wall. Further evaluation with cystoscopy is recommended.  Prostatomegaly.  Cystoscopy with Dr. Erlene Quan positive for bladder mass and possible surrounding CIS in November 2020.  He underwent urethral dilation, meatotomy, cystoscopy with bilateral retrograde pyelograms and TURBT on February 20, 2019 with Dr. Erlene Quan.  Findings positive for bladder tumor, approximately 2 cm.  Urethral stricture.  Meatal stenosis.    Pathology still pending.  Today, he states he had a good night.  He states his son irrigated the catheter 2 times last evening with just a very small clot return each time.  Today the urine is a pink clear tinge in the tubing.  Patient denies any suprapubic/flank pain.  Patient denies any fevers, chills, nausea or vomiting.   He presents again this afternoon and states he has been voiding.  His urine is a light pink tinge.  His PVR is 57 mL.    PMH: Past Medical History:  Diagnosis Date  . A-fib ([place])   . Coronary artery disease   . Hypertension   . Hypothyroidism   . Myocardial infarction ([place])   . Thyroid disease     Surgical History: Past Surgical History:  Procedure Laterality Date  . CARDIAC SURGERY  1995   CABG x 3  . CATARACT EXTRACTION, BILATERAL    . CORONARY ARTERY BYPASS GRAFT  1995   triple   . CYSTOSCOPY W/ RETROGRADES Bilateral 02/20/2019   Procedure: CYSTOSCOPY WITH RETROGRADE PYELOGRAM;  Surgeon: Hollice Espy, MD;  Location: [facility] ORS;  Service: Urology;  Laterality: Bilateral;  . HERNIA REPAIR Bilateral   . MEATOTOMY N/A 02/20/2019   Procedure: MEATOTOMY ADULT;  Surgeon: Hollice Espy, MD;  Location: [facility] ORS;  Service: Urology;  Laterality: N/A;  . TRANSURETHRAL RESECTION OF BLADDER TUMOR WITH MITOMYCIN-C N/A 02/20/2019   Procedure: TRANSURETHRAL RESECTION OF BLADDER TUMOR WITH Gemcitabine;  Surgeon: Hollice Espy, MD;  Location: [facility] ORS;  Service: Urology;  Laterality: N/A;    Home Medications:  Allergies as of 02/22/2019      Reactions   Flexeril [cyclobenzaprine] Other (See Comments)   Blacks out      Medication List       Accurate as of February 22, 2019  1:32 PM. If you have any questions, ask your nurse or doctor.        aspirin EC 81 MG tablet Take 81 mg by mouth daily.   finasteride 5 MG tablet Commonly known as: PROSCAR Take 5 mg by mouth daily.   furosemide 20 MG tablet Commonly known as: Lasix Take 1 tablet (20 mg total) by mouth daily. What changed: when to take this   gabapentin 100 MG capsule Commonly known as: NEURONTIN Take 100 mg by mouth 3 (three) times daily.   levothyroxine 75 MCG tablet Commonly known as: SYNTHROID Take 75 mcg by mouth daily before breakfast.  metoprolol succinate 25 MG 24 hr tablet Commonly known as: TOPROL-XL Take 25 mg by mouth daily.   simvastatin 10 MG tablet Commonly known as: ZOCOR Take 10 mg by mouth daily.   warfarin 2.5 MG tablet Commonly known as: COUMADIN Take 2.5 mg by mouth daily.       Allergies:  Allergies  Allergen Reactions  . Flexeril [Cyclobenzaprine] Other (See Comments)    Blacks out    Family History: No family history on file.  Social History:  reports that he has never smoked. He has never used smokeless tobacco. He reports that he does not drink alcohol or use  drugs.  ROS: UROLOGY Frequent Urination?: No Hard to postpone urination?: No Burning/pain with urination?: No Get up at night to urinate?: No Leakage of urine?: No Urine stream starts and stops?: No Trouble starting stream?: No Do you have to strain to urinate?: No Blood in urine?: Yes Urinary tract infection?: No Sexually transmitted disease?: No Injury to kidneys or bladder?: No Painful intercourse?: No Weak stream?: No Erection problems?: No Penile pain?: No  Gastrointestinal Nausea?: No Vomiting?: No Indigestion/heartburn?: No Diarrhea?: No Constipation?: No  Constitutional Fever: No Night sweats?: No Weight loss?: No Fatigue?: No  Skin Skin rash/lesions?: No Itching?: No  Eyes Blurred vision?: No Double vision?: No  Ears/Nose/Throat Sore throat?: No Sinus problems?: No  Hematologic/Lymphatic Swollen glands?: No Easy bruising?: No  Cardiovascular Leg swelling?: No Chest pain?: No  Respiratory Cough?: No Shortness of breath?: No  Endocrine Excessive thirst?: No  Musculoskeletal Back pain?: No Joint pain?: No  Neurological Headaches?: No Dizziness?: No  Psychologic Depression?: No Anxiety?: No  Physical Exam: BP (!) 161/76   Pulse (!) 56   Ht 6' (1.829 m)   Wt 220 lb (99.8 kg)   BMI 29.84 kg/m   Constitutional:  Well nourished. Alert and oriented, No acute distress. HEENT: [place] AT, moist mucus membranes.  Trachea midline, no masses. Cardiovascular: No clubbing, cyanosis, or edema. Respiratory: Normal respiratory effort, no increased work of breathing. GI: Abdomen is soft, non tender, non distended, no abdominal masses. Liver and spleen not palpable.  No hernias appreciated.  Stool sample for occult testing is not indicated.   GU: No CVA tenderness.  No bladder fullness or masses.  Patient with uncircumcised phallus.   Foreskin easily retracted.  Foley in place draining clear pink urine.  No penile lesions or rashes. Scrotum without  lesions, cysts, rashes and/or edema.   Neurologic: Grossly intact, no focal deficits, moving all 4 extremities. Psychiatric: Normal mood and affect.  Laboratory Data: Lab Results  Component Value Date   WBC 6.7 02/02/2019   HGB 15.0 02/02/2019   HCT 44.2 02/02/2019   MCV 95.1 02/02/2019   PLT 177 02/02/2019    Lab Results  Component Value Date   CREATININE 1.03 02/02/2019    No results found for: PSA  No results found for: TESTOSTERONE  No results found for: HGBA1C  No results found for: TSH  No results found for: CHOL, HDL, CHOLHDL, VLDL, LDLCALC  No results found for: AST No results found for: ALT No components found for: ALKALINEPHOPHATASE No components found for: BILIRUBINTOTAL  No results found for: ESTRADIOL  Urinalysis    Component Value Date/Time   APPEARANCEUR Clear 02/13/2019 1313   GLUCOSEU Negative 02/13/2019 1313   BILIRUBINUR Negative 02/13/2019 1313   PROTEINUR Negative 02/13/2019 1313   NITRITE Negative 02/13/2019 1313   LEUKOCYTESUR Negative 02/13/2019 1313    I have reviewed the  labs.  Catheter Removal  Patient is present today for a catheter removal.  10 ml of water was drained from the balloon. A 18 FR foley cath was removed from the bladder no complications were noted . Patient tolerated well.  Performed by: Zara Council, PA-C  Pertinent Imaging: Results for JISAIAH, COURTLAND (MRN [id number]) as of 02/22/2019 13:30  Ref. Range 02/22/2019 13:29  Scan Result Unknown 57     Assessment & Plan:    1. Voiding trial Foley catheter removed Return to the office for PVR and symptom recheck PVR minimal -voiding well  2. Bladder cancer Surveillance cystoscopy in 3 months pending pathology results on 05/23/2019 with Dr. Erlene Quan   Return for keep appointment with Dr. Erlene Quan .  These notes generated with voice recognition software. I apologize for typographical errors.  Zara Council, PA-C  [facility] [address]  [place], [place] [address] [phone]

## 2019-02-22 ENCOUNTER — Encounter: Payer: Self-pay | Admitting: Urology

## 2019-02-22 ENCOUNTER — Ambulatory Visit: Payer: PPO | Admitting: Urology

## 2019-02-22 ENCOUNTER — Other Ambulatory Visit: Payer: Self-pay

## 2019-02-22 VITALS — BP 161/76 | HR 56 | Ht 72.0 in | Wt 220.0 lb

## 2019-02-22 DIAGNOSIS — C679 Malignant neoplasm of bladder, unspecified: Secondary | ICD-10-CM

## 2019-02-22 LAB — BLADDER SCAN AMB NON-IMAGING: Scan Result: 57

## 2019-02-22 LAB — SURGICAL PATHOLOGY

## 2019-02-26 ENCOUNTER — Emergency Department
Admission: EM | Admit: 2019-02-26 | Discharge: 2019-02-26 | Disposition: A | Discharge status: Home / Self Care | Payer: PPO | Attending: Emergency Medicine | Admitting: Emergency Medicine

## 2019-02-26 ENCOUNTER — Other Ambulatory Visit: Payer: Self-pay

## 2019-02-26 DIAGNOSIS — I482 Chronic atrial fibrillation, unspecified: Secondary | ICD-10-CM | POA: Insufficient documentation

## 2019-02-26 DIAGNOSIS — I252 Old myocardial infarction: Secondary | ICD-10-CM | POA: Diagnosis not present

## 2019-02-26 DIAGNOSIS — E039 Hypothyroidism, unspecified: Secondary | ICD-10-CM | POA: Insufficient documentation

## 2019-02-26 DIAGNOSIS — I10 Essential (primary) hypertension: Secondary | ICD-10-CM | POA: Insufficient documentation

## 2019-02-26 DIAGNOSIS — R310 Gross hematuria: Secondary | ICD-10-CM | POA: Diagnosis not present

## 2019-02-26 DIAGNOSIS — Z7901 Long term (current) use of anticoagulants: Secondary | ICD-10-CM | POA: Insufficient documentation

## 2019-02-26 DIAGNOSIS — I259 Chronic ischemic heart disease, unspecified: Secondary | ICD-10-CM | POA: Insufficient documentation

## 2019-02-26 DIAGNOSIS — R339 Retention of urine, unspecified: Secondary | ICD-10-CM | POA: Insufficient documentation

## 2019-02-26 DIAGNOSIS — Z7982 Long term (current) use of aspirin: Secondary | ICD-10-CM | POA: Diagnosis not present

## 2019-02-26 DIAGNOSIS — Z79899 Other long term (current) drug therapy: Secondary | ICD-10-CM | POA: Diagnosis not present

## 2019-02-26 LAB — CBC
HCT: 42 % (ref 39.0–52.0)
Hemoglobin: 14.1 g/dL (ref 13.0–17.0)
MCH: 32.2 pg (ref 26.0–34.0)
MCHC: 33.6 g/dL (ref 30.0–36.0)
MCV: 95.9 fL (ref 80.0–100.0)
Platelets: 191 10*3/uL (ref 150–400)
RBC: 4.38 MIL/uL (ref 4.22–5.81)
RDW: 12.7 % (ref 11.5–15.5)
WBC: 8 10*3/uL (ref 4.0–10.5)
nRBC: 0 % (ref 0.0–0.2)

## 2019-02-26 LAB — BASIC METABOLIC PANEL
Anion gap: 10 (ref 5–15)
BUN: 23 mg/dL (ref 8–23)
CO2: 25 mmol/L (ref 22–32)
Calcium: 9.3 mg/dL (ref 8.9–10.3)
Chloride: 99 mmol/L (ref 98–111)
Creatinine, Ser: 1.09 mg/dL (ref 0.61–1.24)
GFR calc Af Amer: >60 mL/min (ref >60)
GFR calc non Af Amer: 57 mL/min — ABNORMAL LOW (ref >60)
Glucose, Bld: 116 mg/dL — ABNORMAL HIGH (ref 70–99)
Potassium: 4.7 mmol/L (ref 3.5–5.1)
Sodium: 134 mmol/L — ABNORMAL LOW (ref 135–145)

## 2019-02-26 LAB — URINALYSIS, COMPLETE (UACMP) WITH MICROSCOPIC
Bacteria, UA: NONE SEEN
Bilirubin Urine: NEGATIVE
Glucose, UA: NEGATIVE mg/dL
Ketones, ur: NEGATIVE mg/dL
Leukocytes,Ua: NEGATIVE
Nitrite: NEGATIVE
Protein, ur: NEGATIVE mg/dL
RBC / HPF: >50 RBC/hpf — ABNORMAL HIGH (ref 0–5)
Specific Gravity, Urine: 1.005 (ref 1.005–1.030)
pH: 7 (ref 5.0–8.0)

## 2019-02-26 MED ORDER — LIDOCAINE HCL URETHRAL/MUCOSAL 2 % EX GEL
1.0000 "application " | Freq: Once | CUTANEOUS | Status: DC | PRN
  Filled 2019-02-26: qty 30

## 2019-02-26 NOTE — ED Notes (Signed)
Pt bladder scanned. 111mL Pt has been able to urinate three times since this RN arrival.

## 2019-02-26 NOTE — ED Provider Notes (Signed)
[facility] Emergency Department Provider Note   ____________________________________________   First MD Initiated Contact with Patient 02/26/19 2007     (approximate)  I have reviewed the triage vital signs and the nursing notes.   HISTORY  Chief Complaint Urinary Retention    HPI Carl Coffey is a 85 y.o. male with past medical history of CAD, atrial fibrillation, hypertension who presents to the ED for urinary retention.  1 week ago patient was admitted to the hospital for treatment of urethral stricture as well as TURBT.  Foley catheter was left in place at the time of his discharge, however this was removed during recent urology follow-up.  Patient states that he had been doing well, only occasionally passing small clots.  When he woke up this morning, he noticed that he was having difficulty passing any urine and this persisted throughout the day today.  Shortly after arrival in the ED, he states that he passed a pinky-sized clot and has been able to urinate without difficulty since then.  He denies any burning when he urinates and has not noticed any blood in his urine more recently.  He denies any recent fevers, flank pain, nausea, or vomiting.        Past Medical History:  Diagnosis Date  . A-fib ([place])   . Coronary artery disease   . Hypertension   . Hypothyroidism   . Myocardial infarction ([place])   . Thyroid disease     Patient Active Problem List   Diagnosis Date Noted  . Hyperlipidemia 10/17/2013  . Chronic atrial fibrillation ([place]) 09/09/2013  . Coronary atherosclerosis 09/09/2013  . Hypertension, benign 09/09/2013  . Hypothyroidism, unspecified 09/09/2013  . BPH with obstruction/lower urinary tract symptoms 09/05/2012    Past Surgical History:  Procedure Laterality Date  . CARDIAC SURGERY  1995   CABG x 3  . CATARACT EXTRACTION, BILATERAL    . CORONARY ARTERY BYPASS GRAFT  1995   triple  . CYSTOSCOPY W/ RETROGRADES Bilateral  02/20/2019   Procedure: CYSTOSCOPY WITH RETROGRADE PYELOGRAM;  Surgeon: Hollice Espy, MD;  Location: [facility] ORS;  Service: Urology;  Laterality: Bilateral;  . HERNIA REPAIR Bilateral   . MEATOTOMY N/A 02/20/2019   Procedure: MEATOTOMY ADULT;  Surgeon: Hollice Espy, MD;  Location: [facility] ORS;  Service: Urology;  Laterality: N/A;  . TRANSURETHRAL RESECTION OF BLADDER TUMOR WITH MITOMYCIN-C N/A 02/20/2019   Procedure: TRANSURETHRAL RESECTION OF BLADDER TUMOR WITH Gemcitabine;  Surgeon: Hollice Espy, MD;  Location: [facility] ORS;  Service: Urology;  Laterality: N/A;    Prior to Admission medications   Medication Sig Start Date End Date Taking? Authorizing Provider  aspirin EC 81 MG tablet Take 81 mg by mouth daily.    [provider]  finasteride (PROSCAR) 5 MG tablet Take 5 mg by mouth daily.  02/19/17   [provider]  furosemide (LASIX) 20 MG tablet Take 1 tablet (20 mg total) by mouth daily. Patient taking differently: Take 20 mg by mouth every other day.  02/20/17 01/25/19  Harvest Dark, MD  gabapentin (NEURONTIN) 100 MG capsule Take 100 mg by mouth 3 (three) times daily.    [provider]  levothyroxine (SYNTHROID, LEVOTHROID) 75 MCG tablet Take 75 mcg by mouth daily before breakfast.  12/04/16   [provider]  metoprolol succinate (TOPROL-XL) 25 MG 24 hr tablet Take 25 mg by mouth daily.  01/08/17   [provider]  simvastatin (ZOCOR) 10 MG tablet Take 10 mg by mouth daily.  12/04/16   [provider]  warfarin (COUMADIN) 2.5 MG tablet Take 2.5 mg by mouth daily.  02/12/17   [provider]    Allergies Flexeril [cyclobenzaprine]  No family history on file.  Social History Social History   Tobacco Use  . Smoking status: Never Smoker  . Smokeless tobacco: Never Used  Substance Use Topics  . Alcohol use: Never  . Drug use: Never    Review of Systems  Constitutional: No fever/chills Eyes: No visual  changes. ENT: No sore throat. Cardiovascular: Denies chest pain. Respiratory: Denies shortness of breath. Gastrointestinal: No abdominal pain.  No nausea, no vomiting.  No diarrhea.  No constipation. Genitourinary: Negative for dysuria.  Positive for urinary retention. Musculoskeletal: Negative for back pain. Skin: Negative for rash. Neurological: Negative for headaches, focal weakness or numbness.  ____________________________________________   PHYSICAL EXAM:  VITAL SIGNS: ED Triage Vitals  Enc Vitals Group     BP 02/26/19 1507 (!) 173/77     Pulse Rate 02/26/19 1507 (!) 58     Resp 02/26/19 1507 17     Temp 02/26/19 1507 97.8 F (36.6 C)     Temp Source 02/26/19 1507 Oral     SpO2 02/26/19 1507 98 %     Weight 02/26/19 1505 220 lb (99.8 kg)     Height 02/26/19 1505 6' (1.829 m)     Head Circumference --      Peak Flow --      Pain Score 02/26/19 1509 9     Pain Loc --      Pain Edu? --      Excl. in [place]? --     Constitutional: Alert and oriented. Eyes: Conjunctivae are normal. Head: Atraumatic. Nose: No congestion/rhinnorhea. Mouth/Throat: Mucous membranes are moist. Neck: Normal ROM Cardiovascular: Normal rate, regular rhythm. Grossly normal heart sounds. Respiratory: Normal respiratory effort.  No retractions. Lungs CTAB. Gastrointestinal: Soft and nontender. No distention. Genitourinary: deferred Musculoskeletal: No lower extremity tenderness nor edema. Neurologic:  Normal speech and language. No gross focal neurologic deficits are appreciated. Skin:  Skin is warm, dry and intact. No rash noted. Psychiatric: Mood and affect are normal. Speech and behavior are normal.  ____________________________________________   LABS (all labs ordered are listed, but only abnormal results are displayed)  Labs Reviewed  URINALYSIS, COMPLETE (UACMP) WITH MICROSCOPIC - Abnormal; Notable for the following components:      Result Value   Color, Urine STRAW (*)    APPearance  CLEAR (*)    Hgb urine dipstick LARGE (*)    RBC / HPF >50 (*)    All other components within normal limits  BASIC METABOLIC PANEL - Abnormal; Notable for the following components:   Sodium 134 (*)    Glucose, Bld 116 (*)    GFR calc non Af Amer 57 (*)    All other components within normal limits  CBC     PROCEDURES  Procedure(s) performed (including Critical Care):  Procedures   ____________________________________________   INITIAL IMPRESSION / ASSESSMENT AND PLAN / ED COURSE       85 year old male with recent TURBT and Foley catheter placement presents to the ED for difficulty urinating since this morning.  While he seemed to have urinary retention at his initial arrival to the ED, he has since passed a clot and denies any further difficulty urinating.  He was able to provide a urine sample without difficulty which shows no evidence of infection.  His post void residual was noted  to be 140 cc.  Lab work is unremarkable, renal function reassuring.  Case discussed with Dr. Diamantina Providence of urology, who agrees that Foley catheter placement is not indicated at this time and he patient may follow-up with urology.  Counseled patient to follow-up as scheduled and to return to the ED for new or worsening symptoms, patient agrees with plan.      ____________________________________________   FINAL CLINICAL IMPRESSION(S) / ED DIAGNOSES  Final diagnoses:  Urinary retention  Gross hematuria     ED Discharge Orders    None       Note:  This document was prepared using Dragon voice recognition software and may include unintentional dictation errors.   Blake Divine, MD 02/27/19 [phone]

## 2019-02-26 NOTE — ED Triage Notes (Signed)
Pt states he had bladder surgery last Monday and has not been able to pass urine since last night

## 2019-02-26 NOTE — ED Notes (Signed)
Bladder scan revealed 592 mls.  Pt was able to void shortly after.  Pt stated that he passed a clot "the width and length of a pencil"  This tech scanned the pt's bladder again post void.  Scan revealed 349 mls

## 2019-02-28 ENCOUNTER — Other Ambulatory Visit: Payer: Self-pay

## 2019-02-28 ENCOUNTER — Encounter: Payer: Self-pay | Admitting: Physician Assistant

## 2019-02-28 ENCOUNTER — Ambulatory Visit (INDEPENDENT_AMBULATORY_CARE_PROVIDER_SITE_OTHER): Payer: PPO | Admitting: Physician Assistant

## 2019-02-28 VITALS — BP 147/75 | HR 64 | Ht 72.0 in | Wt 220.0 lb

## 2019-02-28 DIAGNOSIS — R338 Other retention of urine: Secondary | ICD-10-CM

## 2019-02-28 DIAGNOSIS — C679 Malignant neoplasm of bladder, unspecified: Secondary | ICD-10-CM | POA: Diagnosis not present

## 2019-02-28 LAB — BLADDER SCAN AMB NON-IMAGING

## 2019-02-28 NOTE — Progress Notes (Signed)
02/28/2019 2:55 PM   Carl Coffey Apr 26, 1921 [id number]  CC: Follow-up surgical pathology and PVR  HPI: Carl Coffey is a 85 y.o. male who presents today for follow-up with surgical pathology results and PVR.  He underwent cystoscopy and TURBT with urethral dilation and meatotomy with Dr. Erlene Quan on 02/20/2019.  Intraoperative findings significant for meatal stenosis and a 14 French bulbar urethral stricture with an approximate 2 cm left bladder wall papillary tumor which was resected in its entirety.  He received intravesical gemcitabine intraoperatively.  He is scheduled for follow-up cystoscopy in 3 months.  Surgical pathology ultimately resulted with noninvasive high-grade papillary urothelial carcinoma, muscularis propria not present for evaluation.  He sought care in the emergency department 2 days ago with complaints of the inability to urinate.  He was subsequently able to pass a clot approximately the size of his pinky in the ED.  He was immediately able to urinate thereafter.  He has no residual concerns today.  PVR 53mL.  PMH: Past Medical History:  Diagnosis Date  . A-fib ([place])   . Coronary artery disease   . Hypertension   . Hypothyroidism   . Myocardial infarction ([place])   . Thyroid disease     Surgical History: Past Surgical History:  Procedure Laterality Date  . CARDIAC SURGERY  1995   CABG x 3  . CATARACT EXTRACTION, BILATERAL    . CORONARY ARTERY BYPASS GRAFT  1995   triple  . CYSTOSCOPY W/ RETROGRADES Bilateral 02/20/2019   Procedure: CYSTOSCOPY WITH RETROGRADE PYELOGRAM;  Surgeon: Hollice Espy, MD;  Location: [facility] ORS;  Service: Urology;  Laterality: Bilateral;  . HERNIA REPAIR Bilateral   . MEATOTOMY N/A 02/20/2019   Procedure: MEATOTOMY ADULT;  Surgeon: Hollice Espy, MD;  Location: [facility] ORS;  Service: Urology;  Laterality: N/A;  . TRANSURETHRAL RESECTION OF BLADDER TUMOR WITH MITOMYCIN-C N/A 02/20/2019   Procedure: TRANSURETHRAL RESECTION OF BLADDER  TUMOR WITH Gemcitabine;  Surgeon: Hollice Espy, MD;  Location: [facility] ORS;  Service: Urology;  Laterality: N/A;    Home Medications:  Allergies as of 02/28/2019      Reactions   Flexeril [cyclobenzaprine] Other (See Comments)   Blacks out      Medication List       Accurate as of February 28, 2019  2:55 PM. If you have any questions, ask your nurse or doctor.        aspirin EC 81 MG tablet Take 81 mg by mouth daily.   finasteride 5 MG tablet Commonly known as: PROSCAR Take 5 mg by mouth daily.   furosemide 20 MG tablet Commonly known as: Lasix Take 1 tablet (20 mg total) by mouth daily. What changed: when to take this   gabapentin 100 MG capsule Commonly known as: NEURONTIN Take 100 mg by mouth 3 (three) times daily.   levothyroxine 75 MCG tablet Commonly known as: SYNTHROID Take 75 mcg by mouth daily before breakfast.   metoprolol succinate 25 MG 24 hr tablet Commonly known as: TOPROL-XL Take 25 mg by mouth daily.   simvastatin 10 MG tablet Commonly known as: ZOCOR Take 10 mg by mouth daily.   warfarin 2.5 MG tablet Commonly known as: COUMADIN Take 2.5 mg by mouth daily.       Allergies:  Allergies  Allergen Reactions  . Flexeril [Cyclobenzaprine] Other (See Comments)    Blacks out    Family History: No family history on file.  Social History:   reports that he has never smoked. He  has never used smokeless tobacco. He reports that he does not drink alcohol or use drugs.  ROS: UROLOGY Frequent Urination?: No Hard to postpone urination?: No Burning/pain with urination?: No Get up at night to urinate?: No Leakage of urine?: No Urine stream starts and stops?: No Trouble starting stream?: No Do you have to strain to urinate?: No Blood in urine?: No Urinary tract infection?: No Sexually transmitted disease?: No Injury to kidneys or bladder?: No Painful intercourse?: No Weak stream?: No Erection problems?: No Penile pain?:  No  Gastrointestinal Nausea?: No Vomiting?: No Indigestion/heartburn?: No Diarrhea?: No Constipation?: No  Constitutional Fever: No Night sweats?: No Weight loss?: No Fatigue?: No  Skin Skin rash/lesions?: No Itching?: No  Eyes Blurred vision?: No Double vision?: No  Ears/Nose/Throat Sore throat?: No Sinus problems?: No  Hematologic/Lymphatic Swollen glands?: No Easy bruising?: No  Cardiovascular Leg swelling?: No Chest pain?: No  Respiratory Cough?: No Shortness of breath?: No  Endocrine Excessive thirst?: No  Musculoskeletal Back pain?: No Joint pain?: No  Neurological Headaches?: No Dizziness?: No  Psychologic Depression?: No Anxiety?: No  Physical Exam: BP (!) 147/75   Pulse 64   Ht 6' (1.829 m)   Wt 220 lb (99.8 kg)   BMI 29.84 kg/m   Constitutional:  Alert and oriented, no acute distress, nontoxic appearing HEENT: [place], AT Cardiovascular: No clubbing, cyanosis, or edema Respiratory: Normal respiratory effort, no increased work of breathing Skin: No rashes, bruises or suspicious lesions Neurologic: Grossly intact, no focal deficits, moving all 4 extremities Psychiatric: Normal mood and affect  Laboratory Data: Results for orders placed or performed in visit on 02/28/19  Bladder Scan (Post Void Residual) in office  Result Value Ref Range   Scan Result 17ml    Assessment & Plan:   1. Malignant neoplasm of urinary bladder, unspecified site [facility]) 85 year old male presents to clinic today for surgical pathology results s/p TURBT for an approximate 2 cm left lateral bladder wall tumor. Pathology with noninvasive, high-grade papillary urothelial carcinoma. Muscularis propria not present for evaluation.  Patient to follow-up in clinic in 3 months for surveillance cystoscopy. I counseled him that urothelial carcinoma has a high recurrence rate and that it is important for him to keep his surveillance cystoscopy appointments. He expressed  understanding. - Bladder Scan (Post Void Residual) in office  2. Clot retention of urine Resolved. PVR 0 mL. Nothing to do.  Return if symptoms worsen or fail to improve.  Debroah Loop, PA-C  [facility] [address], [place], [place] [address] [phone]

## 2019-03-02 ENCOUNTER — Ambulatory Visit: Payer: PPO | Admitting: Physician Assistant

## 2019-03-07 DIAGNOSIS — E782 Mixed hyperlipidemia: Secondary | ICD-10-CM | POA: Diagnosis not present

## 2019-03-07 DIAGNOSIS — I482 Chronic atrial fibrillation, unspecified: Secondary | ICD-10-CM | POA: Diagnosis not present

## 2019-03-07 DIAGNOSIS — N183 Chronic kidney disease, stage 3 unspecified: Secondary | ICD-10-CM | POA: Diagnosis not present

## 2019-03-07 DIAGNOSIS — I129 Hypertensive chronic kidney disease with stage 1 through stage 4 chronic kidney disease, or unspecified chronic kidney disease: Secondary | ICD-10-CM | POA: Diagnosis not present

## 2019-03-07 DIAGNOSIS — I2510 Atherosclerotic heart disease of native coronary artery without angina pectoris: Secondary | ICD-10-CM | POA: Diagnosis not present

## 2019-03-20 DIAGNOSIS — N183 Chronic kidney disease, stage 3 unspecified: Secondary | ICD-10-CM | POA: Diagnosis not present

## 2019-03-20 DIAGNOSIS — I482 Chronic atrial fibrillation, unspecified: Secondary | ICD-10-CM | POA: Diagnosis not present

## 2019-03-20 DIAGNOSIS — I129 Hypertensive chronic kidney disease with stage 1 through stage 4 chronic kidney disease, or unspecified chronic kidney disease: Secondary | ICD-10-CM | POA: Diagnosis not present

## 2019-03-24 DIAGNOSIS — I482 Chronic atrial fibrillation, unspecified: Secondary | ICD-10-CM | POA: Diagnosis not present

## 2019-03-24 DIAGNOSIS — I2510 Atherosclerotic heart disease of native coronary artery without angina pectoris: Secondary | ICD-10-CM | POA: Diagnosis not present

## 2019-03-24 DIAGNOSIS — N183 Chronic kidney disease, stage 3 unspecified: Secondary | ICD-10-CM | POA: Diagnosis not present

## 2019-03-24 DIAGNOSIS — E782 Mixed hyperlipidemia: Secondary | ICD-10-CM | POA: Diagnosis not present

## 2019-03-24 DIAGNOSIS — E039 Hypothyroidism, unspecified: Secondary | ICD-10-CM | POA: Diagnosis not present

## 2019-03-24 DIAGNOSIS — M5489 Other dorsalgia: Secondary | ICD-10-CM | POA: Diagnosis not present

## 2019-03-24 DIAGNOSIS — I129 Hypertensive chronic kidney disease with stage 1 through stage 4 chronic kidney disease, or unspecified chronic kidney disease: Secondary | ICD-10-CM | POA: Diagnosis not present

## 2019-03-24 DIAGNOSIS — Z0000 Encounter for general adult medical examination without abnormal findings: Secondary | ICD-10-CM | POA: Diagnosis not present

## 2019-04-20 DIAGNOSIS — Z7901 Long term (current) use of anticoagulants: Secondary | ICD-10-CM | POA: Diagnosis not present

## 2019-04-20 DIAGNOSIS — I2510 Atherosclerotic heart disease of native coronary artery without angina pectoris: Secondary | ICD-10-CM | POA: Diagnosis not present

## 2019-04-20 DIAGNOSIS — I129 Hypertensive chronic kidney disease with stage 1 through stage 4 chronic kidney disease, or unspecified chronic kidney disease: Secondary | ICD-10-CM | POA: Diagnosis not present

## 2019-04-20 DIAGNOSIS — E039 Hypothyroidism, unspecified: Secondary | ICD-10-CM | POA: Diagnosis not present

## 2019-04-20 DIAGNOSIS — N183 Chronic kidney disease, stage 3 unspecified: Secondary | ICD-10-CM | POA: Diagnosis not present

## 2019-04-27 DIAGNOSIS — L578 Other skin changes due to chronic exposure to nonionizing radiation: Secondary | ICD-10-CM | POA: Diagnosis not present

## 2019-04-27 DIAGNOSIS — L570 Actinic keratosis: Secondary | ICD-10-CM | POA: Diagnosis not present

## 2019-04-27 DIAGNOSIS — D692 Other nonthrombocytopenic purpura: Secondary | ICD-10-CM | POA: Diagnosis not present

## 2019-04-27 DIAGNOSIS — L820 Inflamed seborrheic keratosis: Secondary | ICD-10-CM | POA: Diagnosis not present

## 2019-05-18 DIAGNOSIS — Z7901 Long term (current) use of anticoagulants: Secondary | ICD-10-CM | POA: Diagnosis not present

## 2019-05-23 ENCOUNTER — Ambulatory Visit: Payer: PPO | Admitting: Urology

## 2019-05-23 ENCOUNTER — Other Ambulatory Visit: Payer: Self-pay

## 2019-05-23 ENCOUNTER — Encounter: Payer: Self-pay | Admitting: Urology

## 2019-05-23 VITALS — BP 156/69 | HR 42 | Ht 69.0 in | Wt 200.0 lb

## 2019-05-23 DIAGNOSIS — Z8551 Personal history of malignant neoplasm of bladder: Secondary | ICD-10-CM | POA: Diagnosis not present

## 2019-05-23 DIAGNOSIS — C679 Malignant neoplasm of bladder, unspecified: Secondary | ICD-10-CM | POA: Diagnosis not present

## 2019-05-23 NOTE — Progress Notes (Signed)
   05/23/19  CC:  Chief Complaint  Patient presents with  . Cysto    HPI: 86 yo M with h/o 2 cm HgTa TCC s/p TURBT 02/2019 who returns today for surveillance cystoscopy.    B RTG negative 12/20.  Received intravesical gemcitabine post op.    There were no vitals taken for this visit. NED. A&Ox3.   No respiratory distress   Abd soft, NT, ND Normal phallus with bilateral descended testicles  Cystoscopy Procedure Note  Patient identification was confirmed, informed consent was obtained, and patient was prepped using Betadine solution.  Lidocaine jelly was administered per urethral meatus.     Pre-Procedure: - Inspection reveals a slightly small caliber ureteral meatus.  Procedure: The flexible cystoscope was introduced without difficulty - No urethral strictures/lesions are present. - Enlarged prostate trilobar coaptation - Elevated bladder neck - Bilateral ureteral orifices identified - Bladder mucosa  reveals no ulcers, tumors, or lesions - stellate scar on left lateral bladder wall - No bladder stones -Moderate trabeculation with diverticulum at dome  Retroflexion shows enlarged prostate   Post-Procedure: - Patient tolerated the procedure well  Assessment/ Plan:  1. History of bladder cancer NED today Given high grade larger tumor, will continue cysto q3 months  - Urinalysis, Complete    Return in about 3 months (around 08/23/2019) for cysto.  Hollice Espy, MD

## 2019-05-24 LAB — URINALYSIS, COMPLETE
Bilirubin, UA: NEGATIVE
Glucose, UA: NEGATIVE
Ketones, UA: NEGATIVE
Leukocytes,UA: NEGATIVE
Nitrite, UA: NEGATIVE
RBC, UA: NEGATIVE
Specific Gravity, UA: 1.025 (ref 1.005–1.030)
Urobilinogen, Ur: 4 mg/dL — ABNORMAL HIGH (ref 0.2–1.0)
pH, UA: 6 (ref 5.0–7.5)

## 2019-05-24 LAB — MICROSCOPIC EXAMINATION: RBC, Urine: NONE SEEN /hpf (ref 0–2)

## 2019-06-02 DIAGNOSIS — M79674 Pain in right toe(s): Secondary | ICD-10-CM | POA: Diagnosis not present

## 2019-06-19 DIAGNOSIS — Z7901 Long term (current) use of anticoagulants: Secondary | ICD-10-CM | POA: Diagnosis not present

## 2019-07-19 DIAGNOSIS — Z7901 Long term (current) use of anticoagulants: Secondary | ICD-10-CM | POA: Diagnosis not present

## 2019-08-21 NOTE — Progress Notes (Signed)
° °  08/22/19  CC:  Chief Complaint  Patient presents with   Cysto    HPI: 86 yo M with h/o 2 cm HgTa TCC s/p TURBT 02/2019 who returns today for surveillance cystoscopy.    B RTG negative 12/20.  Received intravesical gemcitabine post op.    He states of not being allergic to any antibiotics.   Today's Vitals   08/22/19 1100  BP: (!) 190/101  Pulse: (!) 54  Weight: 190 lb (86.2 kg)  Height: 5\' 9"  (1.753 m)   Body mass index is 28.06 kg/m. NED. A&Ox3.   No respiratory distress   Abd soft, NT, ND Normal phallus with bilateral descended testicles  Cystoscopy Procedure Note  Patient identification was confirmed, informed consent was obtained, and patient was prepped using Betadine solution.  Lidocaine jelly was administered per urethral meatus.     Pre-Procedure: - Inspection reveals a normal caliber ureteral meatus.  Procedure: The flexible cystoscope was introduced without difficulty - No urethral strictures/lesions are present. - Enlarged prostate trilobar coaptation - Elevated bladder neck - Bilateral ureteral orifices identified - Bladder mucosa  reveals no ulcers, tumors, or lesions - stellate scar on left lateral bladder wall - No bladder stones -Moderate trabeculation with diverticulum at dome  Retroflexion unremarkable.   Post-Procedure: - Patient tolerated the procedure well  Assessment/ Plan:  1. History of bladder cancer NED today Given high grade larger tumor, will continue cysto q3 months 1 dose of Keflex given today given overall frailty as well as phimosis UA today negative     I, Nethusan Sivanesan, am acting as a scribe for Dr. Hollice Espy,  I have reviewed the above documentation for accuracy and completeness, and I agree with the above.   Hollice Espy, MD

## 2019-08-22 ENCOUNTER — Ambulatory Visit: Payer: PPO | Admitting: Urology

## 2019-08-22 ENCOUNTER — Other Ambulatory Visit: Payer: Self-pay

## 2019-08-22 VITALS — BP 190/101 | HR 54 | Ht 69.0 in | Wt 190.0 lb

## 2019-08-22 DIAGNOSIS — R310 Gross hematuria: Secondary | ICD-10-CM | POA: Diagnosis not present

## 2019-08-22 DIAGNOSIS — Z8551 Personal history of malignant neoplasm of bladder: Secondary | ICD-10-CM

## 2019-08-22 LAB — URINALYSIS, COMPLETE
Bilirubin, UA: NEGATIVE
Glucose, UA: NEGATIVE
Leukocytes,UA: NEGATIVE
Nitrite, UA: NEGATIVE
Specific Gravity, UA: 1.02 (ref 1.005–1.030)
Urobilinogen, Ur: 4 mg/dL — ABNORMAL HIGH (ref 0.2–1.0)
pH, UA: 7 (ref 5.0–7.5)

## 2019-08-22 LAB — MICROSCOPIC EXAMINATION: Bacteria, UA: NONE SEEN

## 2019-08-22 MED ORDER — CEPHALEXIN 250 MG PO CAPS
500.0000 mg | ORAL_CAPSULE | Freq: Once | ORAL | Status: AC
  Administered 2019-08-22: 500 mg via ORAL

## 2019-08-28 DIAGNOSIS — Z7901 Long term (current) use of anticoagulants: Secondary | ICD-10-CM | POA: Diagnosis not present

## 2019-08-31 ENCOUNTER — Other Ambulatory Visit: Payer: Self-pay

## 2019-08-31 ENCOUNTER — Encounter: Payer: Self-pay | Admitting: Dermatology

## 2019-08-31 ENCOUNTER — Ambulatory Visit: Payer: PPO | Admitting: Dermatology

## 2019-08-31 DIAGNOSIS — L821 Other seborrheic keratosis: Secondary | ICD-10-CM | POA: Diagnosis not present

## 2019-08-31 DIAGNOSIS — Z1283 Encounter for screening for malignant neoplasm of skin: Secondary | ICD-10-CM | POA: Diagnosis not present

## 2019-08-31 DIAGNOSIS — D1801 Hemangioma of skin and subcutaneous tissue: Secondary | ICD-10-CM

## 2019-08-31 DIAGNOSIS — L570 Actinic keratosis: Secondary | ICD-10-CM

## 2019-08-31 DIAGNOSIS — L820 Inflamed seborrheic keratosis: Secondary | ICD-10-CM | POA: Diagnosis not present

## 2019-08-31 DIAGNOSIS — L814 Other melanin hyperpigmentation: Secondary | ICD-10-CM | POA: Diagnosis not present

## 2019-08-31 DIAGNOSIS — D229 Melanocytic nevi, unspecified: Secondary | ICD-10-CM | POA: Diagnosis not present

## 2019-08-31 DIAGNOSIS — L578 Other skin changes due to chronic exposure to nonionizing radiation: Secondary | ICD-10-CM

## 2019-08-31 DIAGNOSIS — D692 Other nonthrombocytopenic purpura: Secondary | ICD-10-CM | POA: Diagnosis not present

## 2019-08-31 NOTE — Patient Instructions (Addendum)
Recommend daily broad spectrum sunscreen SPF 30+ to sun-exposed areas, reapply every 2 hours as needed. Call for new or changing lesions.   Prior to procedure, discussed risks of blister formation, small wound, skin dyspigmentation, or rare scar following cryotherapy.  Cryotherapy Aftercare  . Wash gently with soap and water everyday.   . Apply Vaseline and Band-Aid daily until healed.   

## 2019-08-31 NOTE — Progress Notes (Signed)
Follow-Up Visit   Subjective  Carl Coffey is a 86 y.o. male who presents for the following: Recheck AK's.  Patient presents today for a follow up on last OV 04/27/19, Have several AK's treated with cryotherapy. No new areas of concern today The patient presents for Upper Body Skin Exam (UBSE) for skin cancer screening and mole check.  The following portions of the chart were reviewed this encounter and updated as appropriate:  Tobacco  Allergies  Meds  Problems  Med Hx  Surg Hx  Fam Hx      Review of Systems:  No other skin or systemic complaints except as noted in HPI or Assessment and Plan.  Objective  Well appearing patient in no apparent distress; mood and affect are within normal limits.  All skin waist up examined.  Objective  Trunk x18 (18): Erythematous keratotic or waxy stuck-on papule or plaque.   Objective  Face and Ears  x 18 (18): Erythematous thin papules/macules with gritty scale.    Assessment & Plan    Inflamed seborrheic keratosis (18) Trunk x18  Cryotherapy Today Prior to procedure, discussed risks of blister formation, small wound, skin dyspigmentation, or rare scar following cryotherapy.    Destruction of lesion - Trunk x18 Complexity: simple   Destruction method: cryotherapy   Informed consent: discussed and consent obtained   Timeout:  patient name, date of birth, surgical site, and procedure verified Lesion destroyed using liquid nitrogen: Yes   Region frozen until ice ball extended beyond lesion: Yes   Outcome: patient tolerated procedure well with no complications   Post-procedure details: wound care instructions given    AK (actinic keratosis) (18) Face and Ears  x 18  Cryotherapy today Prior to procedure, discussed risks of blister formation, small wound, skin dyspigmentation, or rare scar following cryotherapy.    Destruction of lesion - Face and Ears  x 18 Complexity: simple   Destruction method: cryotherapy   Informed  consent: discussed and consent obtained   Timeout:  patient name, date of birth, surgical site, and procedure verified Lesion destroyed using liquid nitrogen: Yes   Region frozen until ice ball extended beyond lesion: Yes   Outcome: patient tolerated procedure well with no complications   Post-procedure details: wound care instructions given    Skin cancer screening   Lentigines - Scattered tan macules - Discussed due to sun exposure - Benign, observe - Call for any changes  Seborrheic Keratoses - Stuck-on, waxy, tan-brown papules and plaques  - Discussed benign etiology and prognosis. - Observe - Call for any changes  Melanocytic Nevi - Tan-brown and/or pink-flesh-colored symmetric macules and papules - Benign appearing on exam today - Observation - Call clinic for new or changing moles - Recommend daily use of broad spectrum spf 30+ sunscreen to sun-exposed areas.   Hemangiomas - Red papules - Discussed benign nature - Observe - Call for any changes  Actinic Damage - diffuse scaly erythematous macules with underlying dyspigmentation - Recommend daily broad spectrum sunscreen SPF 30+ to sun-exposed areas, reapply every 2 hours as needed.  - Call for new or changing lesions.  Purpura - Violaceous macules and patches - Benign - Related to age, sun damage and/or use of blood thinners - Observe - Can use OTC arnica containing moisturizer such as Dermend Bruise Formula if desired - Call for worsening or other concerns   Skin cancer screening performed today.   Return in about 8 months (around 05/02/2020) for TBSE.  I, Donzetta Kohut,  CMA, am acting as scribe for Sarina Ser, MD . Documentation: I have reviewed the above documentation for accuracy and completeness, and I agree with the above.  Sarina Ser, MD

## 2019-09-03 ENCOUNTER — Encounter: Payer: Self-pay | Admitting: Dermatology

## 2019-09-21 DIAGNOSIS — I129 Hypertensive chronic kidney disease with stage 1 through stage 4 chronic kidney disease, or unspecified chronic kidney disease: Secondary | ICD-10-CM | POA: Diagnosis not present

## 2019-09-21 DIAGNOSIS — N183 Chronic kidney disease, stage 3 unspecified: Secondary | ICD-10-CM | POA: Diagnosis not present

## 2019-09-21 DIAGNOSIS — I2510 Atherosclerotic heart disease of native coronary artery without angina pectoris: Secondary | ICD-10-CM | POA: Diagnosis not present

## 2019-09-21 DIAGNOSIS — E039 Hypothyroidism, unspecified: Secondary | ICD-10-CM | POA: Diagnosis not present

## 2019-09-21 DIAGNOSIS — E782 Mixed hyperlipidemia: Secondary | ICD-10-CM | POA: Diagnosis not present

## 2019-09-21 DIAGNOSIS — I482 Chronic atrial fibrillation, unspecified: Secondary | ICD-10-CM | POA: Diagnosis not present

## 2019-09-26 DIAGNOSIS — I2510 Atherosclerotic heart disease of native coronary artery without angina pectoris: Secondary | ICD-10-CM | POA: Diagnosis not present

## 2019-09-26 DIAGNOSIS — I129 Hypertensive chronic kidney disease with stage 1 through stage 4 chronic kidney disease, or unspecified chronic kidney disease: Secondary | ICD-10-CM | POA: Diagnosis not present

## 2019-09-26 DIAGNOSIS — Z7901 Long term (current) use of anticoagulants: Secondary | ICD-10-CM | POA: Diagnosis not present

## 2019-09-26 DIAGNOSIS — E039 Hypothyroidism, unspecified: Secondary | ICD-10-CM | POA: Diagnosis not present

## 2019-09-26 DIAGNOSIS — N183 Chronic kidney disease, stage 3 unspecified: Secondary | ICD-10-CM | POA: Diagnosis not present

## 2019-09-26 DIAGNOSIS — I482 Chronic atrial fibrillation, unspecified: Secondary | ICD-10-CM | POA: Diagnosis not present

## 2019-10-26 DIAGNOSIS — Z7901 Long term (current) use of anticoagulants: Secondary | ICD-10-CM | POA: Diagnosis not present

## 2019-11-15 DIAGNOSIS — G8929 Other chronic pain: Secondary | ICD-10-CM | POA: Diagnosis not present

## 2019-11-15 DIAGNOSIS — M545 Low back pain: Secondary | ICD-10-CM | POA: Diagnosis not present

## 2019-11-21 IMAGING — CR DG CHEST 2V
2 series · 2 of 2 positions shown · non-contrast
Comparison: None.

CLINICAL DATA: Shortness of breath, swelling in fingers and legs
for 2 days, abdominal swelling after eating seafood a couple days
ago, history hypertension, coronary disease post triple bypass

EXAM:
CHEST  2 VIEW

[chest pa]
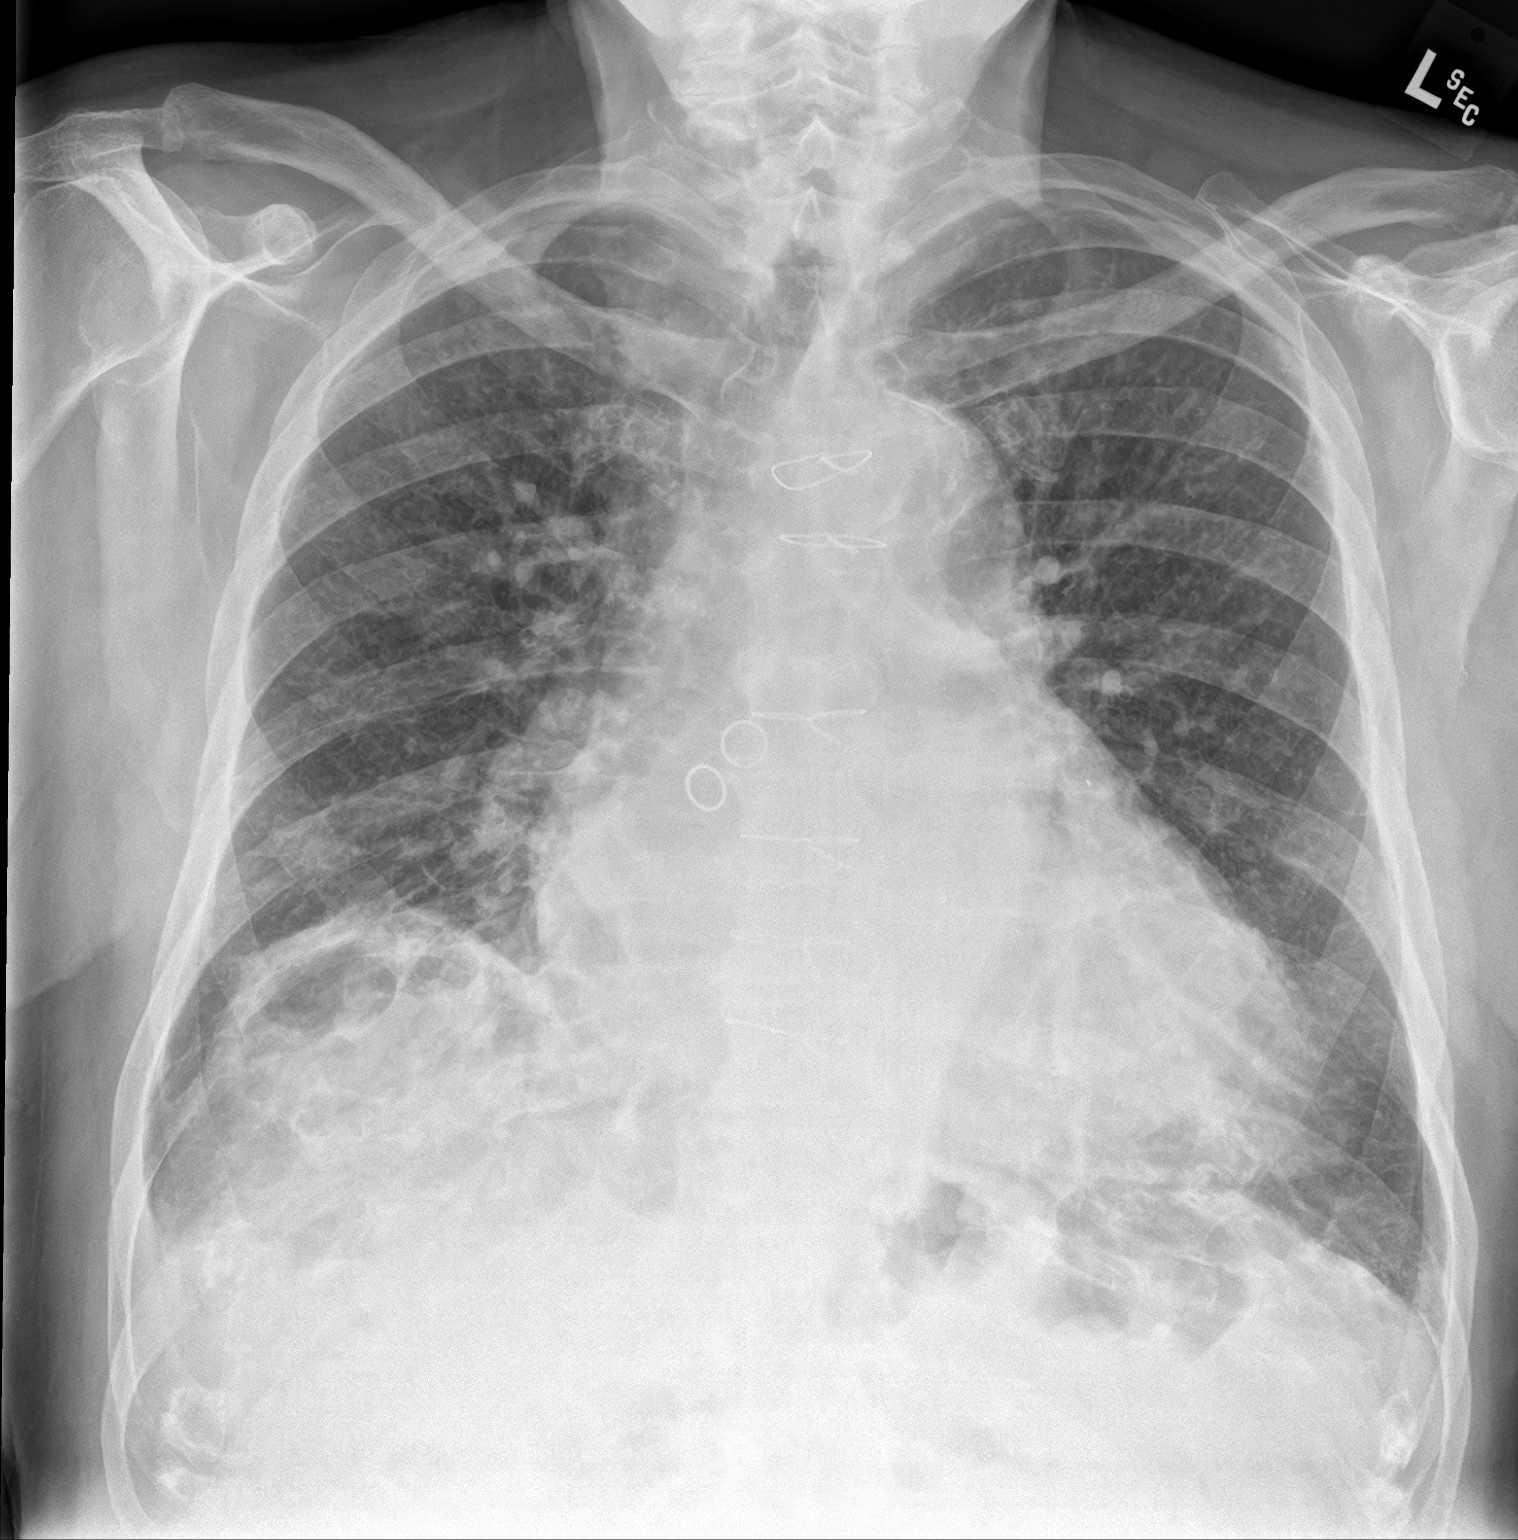

[chest lat]
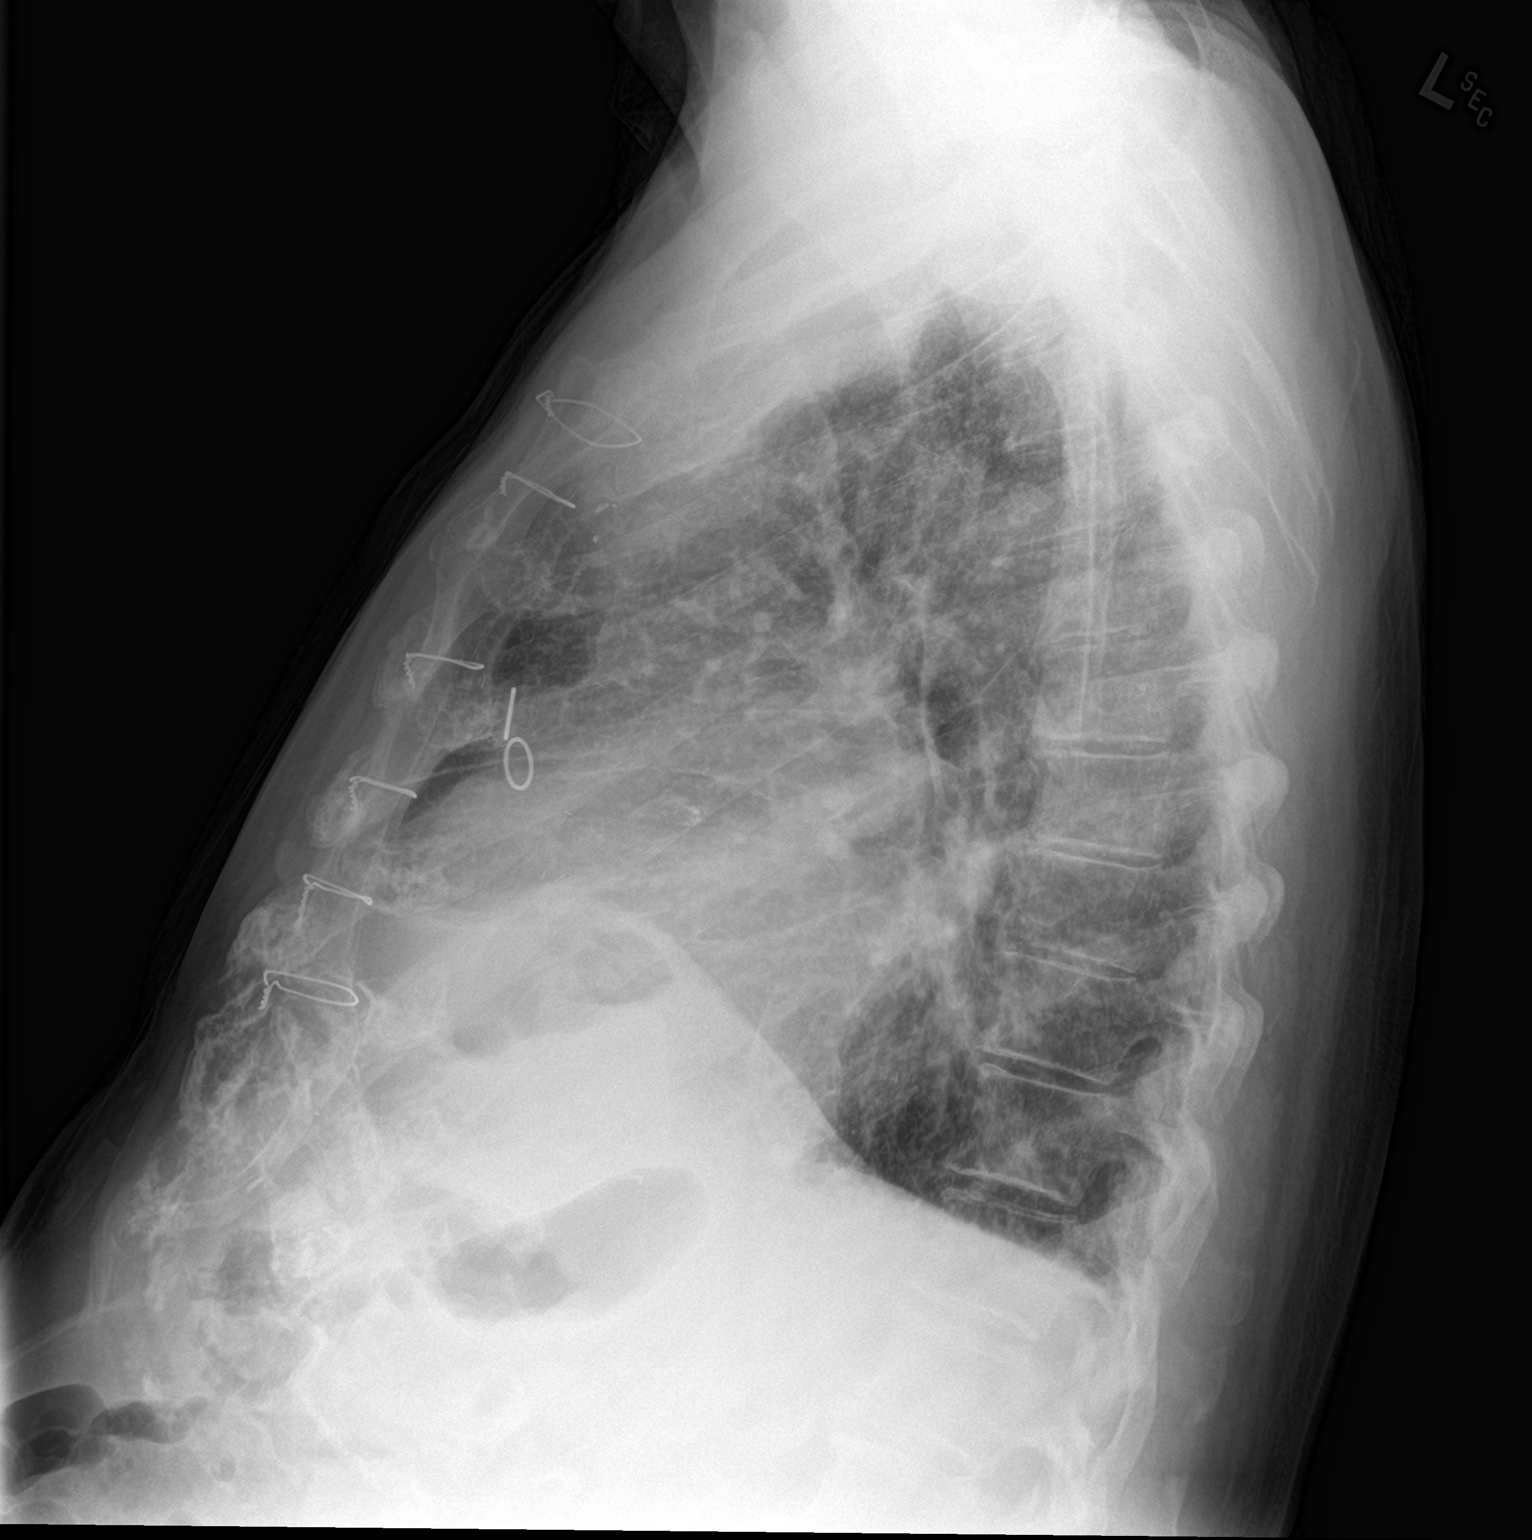

[2 of 2 positions shown; findings below may reference images not displayed]

FINDINGS: Enlargement of cardiac silhouette post CABG.

Pulmonary vascular congestion.

Calcified tortuous thoracic aorta.

Mild central peribronchial thickening with mild RIGHT basilar
atelectasis.

Minimal scattered interstitial prominence and Kerley B-lines at the
LEFT base question minimal interstitial edema.

No pleural effusion or pneumothorax.

Eventration of anterior RIGHT diaphragm with bowel interposition.

No acute osseous findings.
IMPRESSION: Enlargement of cardiac silhouette with pulmonary vascular congestion
post CABG and question minimal pulmonary edema.

Bronchitic changes with RIGHT basilar atelectasis.

## 2019-11-27 DIAGNOSIS — Z7901 Long term (current) use of anticoagulants: Secondary | ICD-10-CM | POA: Diagnosis not present

## 2019-11-28 NOTE — Progress Notes (Signed)
   11/29/2019  CC:  Chief Complaint  Patient presents with  . Cysto    86mo    HPI: Carl Coffey is a 86 y.o. male who presents today for a 3 month surveillance cystoscopy.   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 Received intravesical gemcitabine post op.   Elected to abstain from BCG.     Blood pressure (!) 205/99, pulse 61, height 5\' 8"  (1.727 m), weight 193 lb (87.5 kg). NED. A&Ox3.   No respiratory distress   Abd soft, NT, ND  Normal phallus with bilateral descended testicles  Cystoscopy Procedure Note  Patient identification was confirmed, informed consent was obtained, and patient was prepped using Betadine solution.  Lidocaine jelly was administered per urethral meatus.     Pre-Procedure: - Inspection reveals a normal caliber ureteral meatus.  Procedure: The flexible cystoscope was introduced without difficulty - No urethral strictures/lesions are present. - Enlarged prostate trilobar coaptation - Elevated bladder neck - Bilateral ureteral orifices identified - Bladder mucosa  reveals no ulcers, tumors, or lesions - stellate scar on left lateral bladder wall.  - No bladder stones - Mild trabeculation  Retroflexion shows unremarkable.    Post-Procedure: - Patient tolerated the procedure well  Assessment/ Plan:  1. History of bladder cancer  NED today Cysto was unremarkable, will transition to cystoscopy q6 months given his age and comorbidities 1 dose of Keflex given today  Return in about 6 months (around 05/28/2020) for 57mo cysto.  Fransico Him, am acting as a scribe for Dr. Hollice Espy.  I have reviewed the above documentation for accuracy and completeness, and I agree with the above.   Hollice Espy, MD

## 2019-11-29 ENCOUNTER — Other Ambulatory Visit: Payer: Self-pay

## 2019-11-29 ENCOUNTER — Encounter: Payer: Self-pay | Admitting: Urology

## 2019-11-29 ENCOUNTER — Ambulatory Visit: Payer: PPO | Admitting: Urology

## 2019-11-29 VITALS — BP 205/99 | HR 61 | Ht 68.0 in | Wt 193.0 lb

## 2019-11-29 DIAGNOSIS — C679 Malignant neoplasm of bladder, unspecified: Secondary | ICD-10-CM

## 2019-11-29 MED ORDER — CEPHALEXIN 250 MG PO CAPS
500.0000 mg | ORAL_CAPSULE | Freq: Once | ORAL | Status: AC
  Administered 2019-11-29: 500 mg via ORAL

## 2019-11-30 LAB — URINALYSIS, COMPLETE
Bilirubin, UA: NEGATIVE
Glucose, UA: NEGATIVE
Ketones, UA: NEGATIVE
Nitrite, UA: NEGATIVE
RBC, UA: NEGATIVE
Specific Gravity, UA: 1.02 (ref 1.005–1.030)
Urobilinogen, Ur: 2 mg/dL — ABNORMAL HIGH (ref 0.2–1.0)
pH, UA: 7.5 (ref 5.0–7.5)

## 2019-11-30 LAB — MICROSCOPIC EXAMINATION

## 2019-12-27 DIAGNOSIS — Z7901 Long term (current) use of anticoagulants: Secondary | ICD-10-CM | POA: Diagnosis not present

## 2020-01-02 DIAGNOSIS — M545 Low back pain, unspecified: Secondary | ICD-10-CM | POA: Diagnosis not present

## 2020-01-10 DIAGNOSIS — M545 Low back pain, unspecified: Secondary | ICD-10-CM | POA: Diagnosis not present

## 2020-01-17 DIAGNOSIS — G8929 Other chronic pain: Secondary | ICD-10-CM | POA: Diagnosis not present

## 2020-01-17 DIAGNOSIS — M545 Low back pain, unspecified: Secondary | ICD-10-CM | POA: Diagnosis not present

## 2020-01-29 DIAGNOSIS — Z7901 Long term (current) use of anticoagulants: Secondary | ICD-10-CM | POA: Diagnosis not present

## 2020-02-16 ENCOUNTER — Other Ambulatory Visit: Payer: Self-pay

## 2020-02-16 ENCOUNTER — Emergency Department
Admission: EM | Admit: 2020-02-16 | Discharge: 2020-02-16 | Disposition: A | Discharge status: Home / Self Care | Payer: PPO | Attending: Emergency Medicine | Admitting: Emergency Medicine

## 2020-02-16 ENCOUNTER — Encounter: Payer: Self-pay | Admitting: Emergency Medicine

## 2020-02-16 DIAGNOSIS — Z7982 Long term (current) use of aspirin: Secondary | ICD-10-CM | POA: Insufficient documentation

## 2020-02-16 DIAGNOSIS — Z951 Presence of aortocoronary bypass graft: Secondary | ICD-10-CM | POA: Insufficient documentation

## 2020-02-16 DIAGNOSIS — I2510 Atherosclerotic heart disease of native coronary artery without angina pectoris: Secondary | ICD-10-CM | POA: Diagnosis not present

## 2020-02-16 DIAGNOSIS — E039 Hypothyroidism, unspecified: Secondary | ICD-10-CM | POA: Insufficient documentation

## 2020-02-16 DIAGNOSIS — Z79899 Other long term (current) drug therapy: Secondary | ICD-10-CM | POA: Insufficient documentation

## 2020-02-16 DIAGNOSIS — R0602 Shortness of breath: Secondary | ICD-10-CM | POA: Insufficient documentation

## 2020-02-16 DIAGNOSIS — Z85828 Personal history of other malignant neoplasm of skin: Secondary | ICD-10-CM | POA: Insufficient documentation

## 2020-02-16 DIAGNOSIS — I4891 Unspecified atrial fibrillation: Secondary | ICD-10-CM | POA: Diagnosis not present

## 2020-02-16 DIAGNOSIS — R600 Localized edema: Secondary | ICD-10-CM | POA: Insufficient documentation

## 2020-02-16 DIAGNOSIS — R609 Edema, unspecified: Secondary | ICD-10-CM

## 2020-02-16 DIAGNOSIS — I10 Essential (primary) hypertension: Secondary | ICD-10-CM | POA: Diagnosis not present

## 2020-02-16 DIAGNOSIS — Z7901 Long term (current) use of anticoagulants: Secondary | ICD-10-CM | POA: Diagnosis not present

## 2020-02-16 LAB — COMPREHENSIVE METABOLIC PANEL
ALT: 18 U/L (ref 0–44)
AST: 23 U/L (ref 15–41)
Albumin: 4.1 g/dL (ref 3.5–5.0)
Alkaline Phosphatase: 41 U/L (ref 38–126)
Anion gap: 10 (ref 5–15)
BUN: 22 mg/dL (ref 8–23)
CO2: 30 mmol/L (ref 22–32)
Calcium: 9.4 mg/dL (ref 8.9–10.3)
Chloride: 101 mmol/L (ref 98–111)
Creatinine, Ser: 0.96 mg/dL (ref 0.61–1.24)
GFR, Estimated: >60 mL/min (ref >60)
Glucose, Bld: 104 mg/dL — ABNORMAL HIGH (ref 70–99)
Potassium: 4.2 mmol/L (ref 3.5–5.1)
Sodium: 141 mmol/L (ref 135–145)
Total Bilirubin: 2.4 mg/dL — ABNORMAL HIGH (ref 0.3–1.2)
Total Protein: 7.1 g/dL (ref 6.5–8.1)

## 2020-02-16 LAB — CBC WITH DIFFERENTIAL/PLATELET
Abs Immature Granulocytes: 0.01 10*3/uL (ref 0.00–0.07)
Basophils Absolute: 0.1 10*3/uL (ref 0.0–0.1)
Basophils Relative: 1 %
Eosinophils Absolute: 0.1 10*3/uL (ref 0.0–0.5)
Eosinophils Relative: 2 %
HCT: 45.9 % (ref 39.0–52.0)
Hemoglobin: 15.1 g/dL (ref 13.0–17.0)
Immature Granulocytes: 0 %
Lymphocytes Relative: 21 %
Lymphs Abs: 1.5 10*3/uL (ref 0.7–4.0)
MCH: 32.5 pg (ref 26.0–34.0)
MCHC: 32.9 g/dL (ref 30.0–36.0)
MCV: 98.9 fL (ref 80.0–100.0)
Monocytes Absolute: 0.7 10*3/uL (ref 0.1–1.0)
Monocytes Relative: 10 %
Neutro Abs: 4.7 10*3/uL (ref 1.7–7.7)
Neutrophils Relative %: 66 %
Platelets: 192 10*3/uL (ref 150–400)
RBC: 4.64 MIL/uL (ref 4.22–5.81)
RDW: 13.3 % (ref 11.5–15.5)
WBC: 7.1 10*3/uL (ref 4.0–10.5)
nRBC: 0 % (ref 0.0–0.2)

## 2020-02-16 MED ORDER — CLONIDINE HCL 0.1 MG PO TABS
0.1000 mg | ORAL_TABLET | Freq: Once | ORAL | Status: AC
  Administered 2020-02-16: 0.1 mg via ORAL
  Filled 2020-02-16: qty 1

## 2020-02-16 NOTE — ED Notes (Signed)
Sent rainbow to the lab. 

## 2020-02-16 NOTE — ED Triage Notes (Signed)
Pt to ED via POV stating that he noticed yesterday that his blood pressure is running higher than normal. Pt states that he is on Metoprolol and amlodipine as well as lasix. Pt states that he has missed a few of his fluid pills this past week. Pt states that today his systolic BP was in the 299'M and diastolic was 426. Pt denies chest pain, HA, or dizziness. Pt is able to answer all questions at this time.

## 2020-02-16 NOTE — Discharge Instructions (Addendum)
For your blood pressure:  TONIGHT: - Take one amlodipine 5 mg tablet - Take one furosemide 20 mg tablet   TOMORROW: - Continue your usual metoprolol 25 mg daily - Start regularly taking the amlodipine 5 mg daily at night until follow-up with your primary care doctor - Start regularly taking the furosemide 20 mg daily for the next 3 days, then back to every other day as needed  Call your primary for f/u in 1 week

## 2020-02-16 NOTE — ED Provider Notes (Signed)
[facility] Emergency Department Provider Note  ____________________________________________   First MD Initiated Contact with Patient 02/16/20 1611     (approximate)  I have reviewed the triage vital signs and the nursing notes.   HISTORY  Chief Complaint Hypertension    HPI Carl Coffey is a 86 y.o. male with history of A. fib, hypertension, prior MI, here with hypertension.  Patient states that for the last 2 weeks, he has been eating more than usual.  He has had more salt than usual.  He has also forgotten to take his Lasix.  He states that over the last 2 days, he has been checking his blood pressure and noticed that it was significantly above what he normally runs.  He normally is in the 140s to 150s, but has been as high as 210/110.  He denies any associated symptoms, particularly, he denies any associated chest pain.  He does note some mild shortness of breath over the last several days, but no cough or orthopnea.  He does note some mild increase in his lower extremity swelling.  He also notes that he has not been taking his amlodipine as often as he should.  No other medical complaints.  No numbness or weakness.        Past Medical History:  Diagnosis Date  . A-fib ([place])   . Basal cell carcinoma 10/13/2017   right ant neck  . Coronary artery disease   . Dysplastic nevus 02/10/2016   right og midline epigastric  . Hypertension   . Hypothyroidism   . Myocardial infarction ([place])   . Squamous cell carcinoma of skin 07/26/2007   right post earlobe (in situ)  . Thyroid disease     Patient Active Problem List   Diagnosis Date Noted  . Hyperlipidemia 10/17/2013  . Chronic atrial fibrillation ([place]) 09/09/2013  . Coronary atherosclerosis 09/09/2013  . Hypertension, benign 09/09/2013  . Hypothyroidism, unspecified 09/09/2013  . BPH with obstruction/lower urinary tract symptoms 09/05/2012    Past Surgical History:  Procedure Laterality Date  .  CARDIAC SURGERY  1995   CABG x 3  . CATARACT EXTRACTION, BILATERAL    . CORONARY ARTERY BYPASS GRAFT  1995   triple  . CYSTOSCOPY W/ RETROGRADES Bilateral 02/20/2019   Procedure: CYSTOSCOPY WITH RETROGRADE PYELOGRAM;  Surgeon: Hollice Espy, MD;  Location: [facility] ORS;  Service: Urology;  Laterality: Bilateral;  . HERNIA REPAIR Bilateral   . MEATOTOMY N/A 02/20/2019   Procedure: MEATOTOMY ADULT;  Surgeon: Hollice Espy, MD;  Location: [facility] ORS;  Service: Urology;  Laterality: N/A;  . TRANSURETHRAL RESECTION OF BLADDER TUMOR WITH MITOMYCIN-C N/A 02/20/2019   Procedure: TRANSURETHRAL RESECTION OF BLADDER TUMOR WITH Gemcitabine;  Surgeon: Hollice Espy, MD;  Location: [facility] ORS;  Service: Urology;  Laterality: N/A;    Prior to Admission medications   Medication Sig Start Date End Date Taking? Authorizing Provider  aspirin EC 81 MG tablet Take 81 mg by mouth daily.    [provider]  finasteride (PROSCAR) 5 MG tablet Take 5 mg by mouth daily.  02/19/17   [provider]  furosemide (LASIX) 20 MG tablet Take 1 tablet (20 mg total) by mouth daily. Patient taking differently: Take 20 mg by mouth every other day.  02/20/17 02/28/19  Harvest Dark, MD  gabapentin (NEURONTIN) 100 MG capsule Take 100 mg by mouth 3 (three) times daily.    [provider]  levothyroxine (SYNTHROID, LEVOTHROID) 75 MCG tablet Take 75 mcg by mouth daily  before breakfast.  12/04/16   [provider]  metoprolol succinate (TOPROL-XL) 25 MG 24 hr tablet Take 25 mg by mouth daily.  01/08/17   [provider]  simvastatin (ZOCOR) 10 MG tablet Take 10 mg by mouth daily.  12/04/16   [provider]  warfarin (COUMADIN) 2.5 MG tablet Take 2.5 mg by mouth daily.  02/12/17   [provider]    Allergies Flexeril [cyclobenzaprine]  No family history on file.  Social History Social History   Tobacco Use  . Smoking status: Never Smoker  . Smokeless tobacco:  Never Used  Vaping Use  . Vaping Use: Never used  Substance Use Topics  . Alcohol use: Never  . Drug use: Never    Review of Systems  Review of Systems  Constitutional: Negative for chills, fatigue and fever.  HENT: Negative for sore throat.   Respiratory: Positive for shortness of breath.   Cardiovascular: Positive for leg swelling. Negative for chest pain.  Gastrointestinal: Negative for abdominal pain.  Genitourinary: Negative for flank pain.  Musculoskeletal: Negative for neck pain.  Skin: Negative for rash and wound.  Allergic/Immunologic: Negative for immunocompromised state.  Neurological: Negative for weakness and numbness.  Hematological: Does not bruise/bleed easily.  All other systems reviewed and are negative.    ____________________________________________  PHYSICAL EXAM:      VITAL SIGNS: ED Triage Vitals  Enc Vitals Group     BP 02/16/20 1337 (!) 206/107     Pulse Rate 02/16/20 1336 (!) 58     Resp 02/16/20 1336 16     Temp 02/16/20 1337 97.6 F (36.4 C)     Temp Source 02/16/20 1337 Oral     SpO2 02/16/20 1336 96 %     Weight --      Height --      Head Circumference --      Peak Flow --      Pain Score 02/16/20 1335 0     Pain Loc --      Pain Edu? --      Excl. in [place]? --      Physical Exam Vitals and nursing note reviewed.  Constitutional:      General: He is not in acute distress.    Appearance: He is well-developed.  HENT:     Head: Normocephalic and atraumatic.  Eyes:     Conjunctiva/sclera: Conjunctivae normal.  Cardiovascular:     Rate and Rhythm: Normal rate and regular rhythm.     Heart sounds: Normal heart sounds. No murmur heard.  No friction rub.  Pulmonary:     Effort: Pulmonary effort is normal. No respiratory distress.     Breath sounds: Normal breath sounds. No wheezing or rales.  Abdominal:     General: There is no distension.     Palpations: Abdomen is soft.     Tenderness: There is no abdominal tenderness.    Musculoskeletal:     Cervical back: Neck supple.     Right lower leg: Edema (1+ pitting) present.     Left lower leg: Edema (1+ pitting) present.  Skin:    General: Skin is warm.     Capillary Refill: Capillary refill takes less than 2 seconds.  Neurological:     Mental Status: He is alert and oriented to person, place, and time.     Motor: No abnormal muscle tone.     Comments: Alert, oriented, No focal deficits.       ____________________________________________  LABS (all labs ordered are listed, but only abnormal results are displayed)  Labs Reviewed  COMPREHENSIVE METABOLIC PANEL - Abnormal; Notable for the following components:      Result Value   Glucose, Bld 104 (*)    Total Bilirubin 2.4 (*)    All other components within normal limits  CBC WITH DIFFERENTIAL/PLATELET    ____________________________________________  EKG: Atrial fibrillation, ventricular rate 66.  QRS 102, QTc 526.  Minimal criteria for LVH.  No acute ST elevations or depressions.  No EKG evidence of acute ischemia or infarct. ________________________________________  RADIOLOGY All imaging, including plain films, CT scans, and ultrasounds, independently reviewed by me, and interpretations confirmed via formal radiology reads.  ED MD interpretation:     Official radiology report(s): No results found.  ____________________________________________  PROCEDURES   Procedure(s) performed (including Critical Care):  Procedures  ____________________________________________  INITIAL IMPRESSION / MDM / [place] / ED COURSE  As part of my medical decision making, I reviewed the following data within the [place] notes reviewed and incorporated, Old chart reviewed, Notes from prior ED visits, and [place] Controlled Substance Database       *Judd E Mcdill was evaluated in Emergency Department on 02/16/2020 for the symptoms described in the history of present  illness. He was evaluated in the context of the global COVID-19 pandemic, which necessitated consideration that the patient might be at risk for infection with the SARS-CoV-2 virus that causes COVID-19. Institutional protocols and algorithms that pertain to the evaluation of patients at risk for COVID-19 are in a state of rapid change based on information released by regulatory bodies including the CDC and federal and state organizations. These policies and algorithms were followed during the patient's care in the ED.  Some ED evaluations and interventions may be delayed as a result of limited staffing during the pandemic.*     Medical Decision Making:  86 yo M here with asymptomatic hypertension with the exception of mild shortness of breath.  Clinically, the patient appears very well and in no distress.  He has no headache or neurological deficits.  No signs of hypertensive encephalopathy.  His EKG is nonischemic and he is adamant he has no chest pain or signs to suggest ACS or cardiac ischemia.  His renal function is normal.  CBC and CMP reviewed and otherwise unremarkable.  Clinically, he has some mild pitting edema in his bilateral legs, but is satting well on room air with normal work of breathing and no rails on lung exam.  He would like to manage symptoms at home which I think is very reasonable given that he is overall essentially asymptomatic.  He was given a single dose of clonidine here with improvement in his blood pressure.  Of note, all of his symptoms seem to correlate with when he has increased his salt intake and stopped his Lasix as well as his amlodipine.  Will have him restart his amlodipine tonight as well as take an additional dose of Lasix tonight.  Will then have him go back to the Lasix once daily.  He will follow up with his PCP in the next week.  Return precautions given.  ____________________________________________  FINAL CLINICAL IMPRESSION(S) / ED DIAGNOSES  Final diagnoses:   Peripheral edema  Essential hypertension     MEDICATIONS GIVEN DURING THIS VISIT:  Medications  cloNIDine (CATAPRES) tablet 0.1 mg (0.1 mg Oral Given 02/16/20 1646)     ED Discharge Orders    None  Note:  This document was prepared using Dragon voice recognition software and may include unintentional dictation errors.   Duffy Bruce, MD 02/16/20 2044

## 2020-02-22 DIAGNOSIS — I129 Hypertensive chronic kidney disease with stage 1 through stage 4 chronic kidney disease, or unspecified chronic kidney disease: Secondary | ICD-10-CM | POA: Diagnosis not present

## 2020-02-22 DIAGNOSIS — N183 Chronic kidney disease, stage 3 unspecified: Secondary | ICD-10-CM | POA: Diagnosis not present

## 2020-02-22 DIAGNOSIS — I482 Chronic atrial fibrillation, unspecified: Secondary | ICD-10-CM | POA: Diagnosis not present

## 2020-02-28 DIAGNOSIS — Z7901 Long term (current) use of anticoagulants: Secondary | ICD-10-CM | POA: Diagnosis not present

## 2020-03-12 DIAGNOSIS — M545 Low back pain, unspecified: Secondary | ICD-10-CM | POA: Diagnosis not present

## 2020-03-19 DIAGNOSIS — M545 Low back pain, unspecified: Secondary | ICD-10-CM | POA: Diagnosis not present

## 2020-03-25 DIAGNOSIS — I482 Chronic atrial fibrillation, unspecified: Secondary | ICD-10-CM | POA: Diagnosis not present

## 2020-03-25 DIAGNOSIS — N183 Chronic kidney disease, stage 3 unspecified: Secondary | ICD-10-CM | POA: Diagnosis not present

## 2020-03-25 DIAGNOSIS — C679 Malignant neoplasm of bladder, unspecified: Secondary | ICD-10-CM | POA: Diagnosis not present

## 2020-03-25 DIAGNOSIS — I129 Hypertensive chronic kidney disease with stage 1 through stage 4 chronic kidney disease, or unspecified chronic kidney disease: Secondary | ICD-10-CM | POA: Diagnosis not present

## 2020-03-25 DIAGNOSIS — Z0000 Encounter for general adult medical examination without abnormal findings: Secondary | ICD-10-CM | POA: Diagnosis not present

## 2020-03-25 DIAGNOSIS — E039 Hypothyroidism, unspecified: Secondary | ICD-10-CM | POA: Diagnosis not present

## 2020-03-25 DIAGNOSIS — I2510 Atherosclerotic heart disease of native coronary artery without angina pectoris: Secondary | ICD-10-CM | POA: Diagnosis not present

## 2020-04-02 DIAGNOSIS — E039 Hypothyroidism, unspecified: Secondary | ICD-10-CM | POA: Diagnosis not present

## 2020-04-02 DIAGNOSIS — I482 Chronic atrial fibrillation, unspecified: Secondary | ICD-10-CM | POA: Diagnosis not present

## 2020-04-02 DIAGNOSIS — N183 Chronic kidney disease, stage 3 unspecified: Secondary | ICD-10-CM | POA: Diagnosis not present

## 2020-04-02 DIAGNOSIS — M545 Low back pain, unspecified: Secondary | ICD-10-CM | POA: Diagnosis not present

## 2020-04-02 DIAGNOSIS — I2510 Atherosclerotic heart disease of native coronary artery without angina pectoris: Secondary | ICD-10-CM | POA: Diagnosis not present

## 2020-04-02 DIAGNOSIS — I129 Hypertensive chronic kidney disease with stage 1 through stage 4 chronic kidney disease, or unspecified chronic kidney disease: Secondary | ICD-10-CM | POA: Diagnosis not present

## 2020-04-15 DIAGNOSIS — I2510 Atherosclerotic heart disease of native coronary artery without angina pectoris: Secondary | ICD-10-CM | POA: Diagnosis not present

## 2020-04-15 DIAGNOSIS — E782 Mixed hyperlipidemia: Secondary | ICD-10-CM | POA: Diagnosis not present

## 2020-04-15 DIAGNOSIS — E039 Hypothyroidism, unspecified: Secondary | ICD-10-CM | POA: Diagnosis not present

## 2020-04-15 DIAGNOSIS — I482 Chronic atrial fibrillation, unspecified: Secondary | ICD-10-CM | POA: Diagnosis not present

## 2020-04-15 DIAGNOSIS — N183 Chronic kidney disease, stage 3 unspecified: Secondary | ICD-10-CM | POA: Diagnosis not present

## 2020-04-15 DIAGNOSIS — I129 Hypertensive chronic kidney disease with stage 1 through stage 4 chronic kidney disease, or unspecified chronic kidney disease: Secondary | ICD-10-CM | POA: Diagnosis not present

## 2020-04-16 DIAGNOSIS — M545 Low back pain, unspecified: Secondary | ICD-10-CM | POA: Diagnosis not present

## 2020-04-29 ENCOUNTER — Encounter: Payer: PPO | Admitting: Dermatology

## 2020-05-02 DIAGNOSIS — I482 Chronic atrial fibrillation, unspecified: Secondary | ICD-10-CM | POA: Diagnosis not present

## 2020-05-15 DIAGNOSIS — M545 Low back pain, unspecified: Secondary | ICD-10-CM | POA: Diagnosis not present

## 2020-05-29 ENCOUNTER — Ambulatory Visit (INDEPENDENT_AMBULATORY_CARE_PROVIDER_SITE_OTHER): Payer: PPO | Admitting: Urology

## 2020-05-29 ENCOUNTER — Other Ambulatory Visit: Payer: Self-pay

## 2020-05-29 ENCOUNTER — Encounter: Payer: Self-pay | Admitting: Urology

## 2020-05-29 VITALS — BP 129/61 | HR 59 | Ht 70.0 in | Wt 196.0 lb

## 2020-05-29 DIAGNOSIS — C679 Malignant neoplasm of bladder, unspecified: Secondary | ICD-10-CM | POA: Diagnosis not present

## 2020-05-29 LAB — URINALYSIS, COMPLETE
Bilirubin, UA: NEGATIVE
Glucose, UA: NEGATIVE
Leukocytes,UA: NEGATIVE
Nitrite, UA: NEGATIVE
Protein,UA: NEGATIVE
RBC, UA: NEGATIVE
Specific Gravity, UA: 1.025 (ref 1.005–1.030)
Urobilinogen, Ur: 1 mg/dL (ref 0.2–1.0)
pH, UA: 5.5 (ref 5.0–7.5)

## 2020-05-29 LAB — MICROSCOPIC EXAMINATION
Bacteria, UA: NONE SEEN
RBC, Urine: NONE SEEN /hpf (ref 0–2)
WBC, UA: NONE SEEN /hpf (ref 0–5)

## 2020-05-29 MED ORDER — CEPHALEXIN 250 MG PO CAPS
500.0000 mg | ORAL_CAPSULE | Freq: Once | ORAL | Status: AC
  Administered 2020-05-29: 500 mg via ORAL

## 2020-05-29 NOTE — Progress Notes (Signed)
   05/29/20  CC:  Chief Complaint  Patient presents with  . Cysto    HPI: Carl Coffey is a 87 y.o. male who presents today for a 3 month surveillance cystoscopy.   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 Received intravesical gemcitabine post op.   Elected to abstain from BCG.    No new urinary issues.  Urinalysis is unremarkable.   Blood pressure 129/61, pulse (!) 59, height 5\' 10"  (1.778 m), weight 196 lb (88.9 kg). NED. A&Ox3.   No respiratory distress   Abd soft, NT, ND  Normal phallus with bilateral descended testicles  Cystoscopy Procedure Note  Patient identification was confirmed, informed consent was obtained, and patient was prepped using Betadine solution.  Lidocaine jelly was administered per urethral meatus.     Pre-Procedure: - Inspection reveals a normal caliber ureteral meatus.  Procedure: The flexible cystoscope was introduced without difficulty - No urethral strictures/lesions are present. - Enlarged prostate trilobar coaptation, small 2 mm papillary projection arising from the right prostatic fossa/bladder neck suspicious for either inflammatory versus early recurrence.  I used the beak of the scope to manipulate this and the lesion was dislodged with no residual obvious remaining lesion upon reassessment.  There is also some very subtle changes in his membranous urethra. - Elevated bladder neck - Bilateral ureteral orifices identified - Bladder mucosa  reveals no ulcers, tumors, or lesions - stellate scar on left lateral bladder wall.  - No bladder stones - Mild trabeculation  Retroflexion shows unremarkable.    Post-Procedure: - Patient tolerated the procedure well  Assessment/ Plan:  1. History of bladder cancer  No obvious disease today however there was this polyp versus early occurrence versus inflammatory lesion at the bladder neck which was dislodged with the beak of the scope  Given these unclear findings  today, recommended follow-up with cystoscopy in 3 months rather than the previously prescribed 56-month interval.  He is agreeable this plan.  We will pay careful attention to this area and repeat cystoscopy.  Keflex 500 mg x 1 given today in the setting of fairly significant phimosis and cystoscopic intervention.   Hollice Espy, MD

## 2020-05-30 DIAGNOSIS — M545 Low back pain, unspecified: Secondary | ICD-10-CM | POA: Diagnosis not present

## 2020-05-30 DIAGNOSIS — I482 Chronic atrial fibrillation, unspecified: Secondary | ICD-10-CM | POA: Diagnosis not present

## 2020-06-03 ENCOUNTER — Ambulatory Visit: Payer: PPO | Admitting: Dermatology

## 2020-06-25 ENCOUNTER — Other Ambulatory Visit: Payer: Self-pay | Admitting: Urology

## 2020-07-01 DIAGNOSIS — I482 Chronic atrial fibrillation, unspecified: Secondary | ICD-10-CM | POA: Diagnosis not present

## 2020-07-31 DIAGNOSIS — I482 Chronic atrial fibrillation, unspecified: Secondary | ICD-10-CM | POA: Diagnosis not present

## 2020-08-30 DIAGNOSIS — I482 Chronic atrial fibrillation, unspecified: Secondary | ICD-10-CM | POA: Diagnosis not present

## 2020-09-03 ENCOUNTER — Other Ambulatory Visit: Payer: Self-pay

## 2020-09-03 ENCOUNTER — Ambulatory Visit (INDEPENDENT_AMBULATORY_CARE_PROVIDER_SITE_OTHER): Payer: PPO | Admitting: Urology

## 2020-09-03 ENCOUNTER — Encounter: Payer: Self-pay | Admitting: Urology

## 2020-09-03 VITALS — BP 151/76 | HR 74 | Ht 70.0 in | Wt 200.0 lb

## 2020-09-03 DIAGNOSIS — C679 Malignant neoplasm of bladder, unspecified: Secondary | ICD-10-CM

## 2020-09-03 DIAGNOSIS — Z8551 Personal history of malignant neoplasm of bladder: Secondary | ICD-10-CM

## 2020-09-03 DIAGNOSIS — N3289 Other specified disorders of bladder: Secondary | ICD-10-CM

## 2020-09-03 LAB — URINALYSIS, COMPLETE
Bilirubin, UA: NEGATIVE
Glucose, UA: NEGATIVE
Ketones, UA: NEGATIVE
Leukocytes,UA: NEGATIVE
Nitrite, UA: NEGATIVE
Protein,UA: NEGATIVE
RBC, UA: NEGATIVE
Specific Gravity, UA: 1.025 (ref 1.005–1.030)
Urobilinogen, Ur: 2 mg/dL — ABNORMAL HIGH (ref 0.2–1.0)
pH, UA: 6 (ref 5.0–7.5)

## 2020-09-03 LAB — MICROSCOPIC EXAMINATION
Bacteria, UA: NONE SEEN
Epithelial Cells (non renal): NONE SEEN /hpf (ref 0–10)
RBC, Urine: NONE SEEN /hpf (ref 0–2)
WBC, UA: NONE SEEN /hpf (ref 0–5)

## 2020-09-03 MED ORDER — CEPHALEXIN 250 MG PO CAPS
500.0000 mg | ORAL_CAPSULE | Freq: Once | ORAL | Status: AC
  Administered 2020-09-03: 500 mg via ORAL

## 2020-09-03 NOTE — Progress Notes (Signed)
   09/03/20  CC:  Chief Complaint  Patient presents with   Cysto    HPI: Carl Coffey is a 87 y.o. male who presents today for a 3 month surveillance cystoscopy.   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  Received intravesical gemcitabine post op.    Elected to abstain from BCG.    No new urinary issues.  Urinalysis is unremarkable.   Blood pressure (!) 151/76, pulse 74, height 5\' 10"  (1.778 m), weight 200 lb (90.7 kg). NED. A&Ox3.   No respiratory distress   Abd soft, NT, ND  Normal phallus with bilateral descended testicles  Cystoscopy Procedure Note  Patient identification was confirmed, informed consent was obtained, and patient was prepped using Betadine solution.  Lidocaine jelly was administered per urethral meatus.     Pre-Procedure: - Inspection reveals a normal caliber ureteral meatus.  Procedure: The flexible cystoscope was introduced without difficulty - No urethral strictures/lesions are present. - Enlarged prostate trilobar coaptation,  - Elevated bladder neck - Bilateral ureteral orifices identified - Bladder mucosa   stellate scar on left lateral bladder wall.  - No bladder stones - Mild trabeculation  Retroflexion shows a very small early papillary lesion, about 2 mm in diameter at the left bladder neck only appreciated on retroflexion.  He also has small median lobe component.   Post-Procedure: - Patient tolerated the procedure well  Assessment/ Plan:  1. Bladder mass Small papillary lesion consistent with probable early low-grade superficial recurrence  Given his age and comorbidities, we discussed the option of in office fulguration which at the mass appears to be amenable to with the scope in a retroflexed position.  We discussed risk and benefits including risk of bleeding, infection and discomfort.  We also discussed possible need for further intervention if this fails.  He is agreeable this plan.  We will -  Urinalysis, Complete  2. History of bladder cancer As above - Urinalysis, Complete  Schedule office fulguration  Hollice Espy, MD

## 2020-09-25 DIAGNOSIS — I482 Chronic atrial fibrillation, unspecified: Secondary | ICD-10-CM | POA: Diagnosis not present

## 2020-09-25 DIAGNOSIS — I2510 Atherosclerotic heart disease of native coronary artery without angina pectoris: Secondary | ICD-10-CM | POA: Diagnosis not present

## 2020-09-25 DIAGNOSIS — E039 Hypothyroidism, unspecified: Secondary | ICD-10-CM | POA: Diagnosis not present

## 2020-09-25 DIAGNOSIS — I129 Hypertensive chronic kidney disease with stage 1 through stage 4 chronic kidney disease, or unspecified chronic kidney disease: Secondary | ICD-10-CM | POA: Diagnosis not present

## 2020-09-25 DIAGNOSIS — E782 Mixed hyperlipidemia: Secondary | ICD-10-CM | POA: Diagnosis not present

## 2020-09-25 DIAGNOSIS — N183 Chronic kidney disease, stage 3 unspecified: Secondary | ICD-10-CM | POA: Diagnosis not present

## 2020-09-26 ENCOUNTER — Ambulatory Visit: Payer: PPO | Admitting: Urology

## 2020-09-26 ENCOUNTER — Other Ambulatory Visit: Payer: Self-pay

## 2020-09-26 ENCOUNTER — Encounter: Payer: Self-pay | Admitting: Urology

## 2020-09-26 VITALS — BP 145/69 | HR 72 | Ht 70.0 in | Wt 199.0 lb

## 2020-09-26 DIAGNOSIS — C679 Malignant neoplasm of bladder, unspecified: Secondary | ICD-10-CM | POA: Diagnosis not present

## 2020-09-26 LAB — URINALYSIS, COMPLETE
Bilirubin, UA: NEGATIVE
Glucose, UA: NEGATIVE
Nitrite, UA: NEGATIVE
Protein,UA: NEGATIVE
Specific Gravity, UA: 1.025 (ref 1.005–1.030)
Urobilinogen, Ur: 1 mg/dL (ref 0.2–1.0)
pH, UA: 5.5 (ref 5.0–7.5)

## 2020-09-26 LAB — MICROSCOPIC EXAMINATION: Bacteria, UA: NONE SEEN

## 2020-09-26 MED ORDER — LIDOCAINE HCL 2 % IJ SOLN
50.0000 mL | Freq: Once | INTRAMUSCULAR | Status: AC
  Administered 2020-09-26: 1000 mg

## 2020-09-26 MED ORDER — SULFAMETHOXAZOLE-TRIMETHOPRIM 800-160 MG PO TABS
1.0000 | ORAL_TABLET | Freq: Once | ORAL | Status: AC
  Administered 2020-09-26: 1 via ORAL

## 2020-09-26 NOTE — Progress Notes (Signed)
Bladder Instillation  Due to Bladder Fulguration Procedure patient is present today for a Bladder Instillation of Lidocaine 2%. Patient was cleaned and prepped in a sterile fashion with betadine and lidocaine 2% jelly was instilled into the urethra.  A 14 FR red rubber catheter was inserted.  50 ml of Lidocaine 2% was instilled into the bladder. The catheter was then removed. Patient tolerated well, no complications were noted Patient held in bladder for 30 minutes prior to procedure starting.   Performed by: Fonnie Jarvis, CMA

## 2020-09-26 NOTE — Progress Notes (Signed)
   09/26/20  CC:  Chief Complaint  Patient presents with   Cysto    Bladder Fulgeration    HPI: Carl Coffey is a 87 y.o. male who presents today for a 3 month surveillance cystoscopy.   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  Received intravesical gemcitabine post op.    Elected to abstain from BCG.    Last cystoscopy, is noted to have a very small early probable recurrence and is elected to pursue fulguration today.  To the procedure, lidocaine was instilled to the bladder for half an hour and allowed to dwell.  Please see CMA note for details.  This was well-tolerated.  Blood pressure (!) 145/69, pulse 72, height 5\' 10"  (1.778 m), weight 199 lb (90.3 kg). NED. A&Ox3.   No respiratory distress   Abd soft, NT, ND  Normal phallus with bilateral descended testicles  Cystoscopy Procedure Note  Patient identification was confirmed, informed consent was obtained, and patient was prepped using Betadine solution.  Lidocaine jelly was administered per urethral meatus.     Pre-Procedure: - Inspection reveals a normal caliber ureteral meatus.  Procedure: The flexible cystoscope was introduced without difficulty - No urethral strictures/lesions are present. - Enlarged prostate trilobar coaptation,  - Elevated bladder neck - Bilateral ureteral orifices identified - Bladder mucosa   stellate scar on left lateral bladder wall.  - No bladder stones - Mild trabeculation  Retroflexion shows a very small early papillary lesion, about 2 mm in diameter at the left bladder neck at the inferior pole of the stellate scar on the lateral wall.  At this point in time, small pinpoint Bugbee electrocautery was used that the settings of 30 to fulgurate the lesion in question.  Is completely obliterated.  This was well-tolerated.  He was administered periprocedural antibiotics today the form of Bactrim DS x1.  Post-Procedure: - Patient tolerated the procedure  well  Assessment/ Plan:  1. Bladder mass Small papillary lesion consistent with probable early low-grade superficial recurrence s/p uncomplicated fulguration today   2. History of bladder cancer As above - Urinalysis, Complete  Return in 3 months to reassess with cysto  Hollice Espy, MD

## 2020-10-09 ENCOUNTER — Other Ambulatory Visit: Payer: Self-pay

## 2020-10-09 ENCOUNTER — Ambulatory Visit: Payer: PPO | Admitting: Dermatology

## 2020-10-09 DIAGNOSIS — L570 Actinic keratosis: Secondary | ICD-10-CM | POA: Diagnosis not present

## 2020-10-09 DIAGNOSIS — D2339 Other benign neoplasm of skin of other parts of face: Secondary | ICD-10-CM

## 2020-10-09 DIAGNOSIS — L219 Seborrheic dermatitis, unspecified: Secondary | ICD-10-CM

## 2020-10-09 DIAGNOSIS — L821 Other seborrheic keratosis: Secondary | ICD-10-CM | POA: Diagnosis not present

## 2020-10-09 DIAGNOSIS — D229 Melanocytic nevi, unspecified: Secondary | ICD-10-CM | POA: Diagnosis not present

## 2020-10-09 DIAGNOSIS — L820 Inflamed seborrheic keratosis: Secondary | ICD-10-CM

## 2020-10-09 DIAGNOSIS — C44612 Basal cell carcinoma of skin of right upper limb, including shoulder: Secondary | ICD-10-CM | POA: Diagnosis not present

## 2020-10-09 DIAGNOSIS — L814 Other melanin hyperpigmentation: Secondary | ICD-10-CM

## 2020-10-09 DIAGNOSIS — D1800 Hemangioma unspecified site: Secondary | ICD-10-CM

## 2020-10-09 DIAGNOSIS — D239 Other benign neoplasm of skin, unspecified: Secondary | ICD-10-CM

## 2020-10-09 DIAGNOSIS — L720 Epidermal cyst: Secondary | ICD-10-CM

## 2020-10-09 DIAGNOSIS — Z1283 Encounter for screening for malignant neoplasm of skin: Secondary | ICD-10-CM

## 2020-10-09 DIAGNOSIS — Z86018 Personal history of other benign neoplasm: Secondary | ICD-10-CM | POA: Diagnosis not present

## 2020-10-09 DIAGNOSIS — Z85828 Personal history of other malignant neoplasm of skin: Secondary | ICD-10-CM | POA: Diagnosis not present

## 2020-10-09 DIAGNOSIS — Z86007 Personal history of in-situ neoplasm of skin: Secondary | ICD-10-CM | POA: Diagnosis not present

## 2020-10-09 DIAGNOSIS — L578 Other skin changes due to chronic exposure to nonionizing radiation: Secondary | ICD-10-CM | POA: Diagnosis not present

## 2020-10-09 DIAGNOSIS — D492 Neoplasm of unspecified behavior of bone, soft tissue, and skin: Secondary | ICD-10-CM

## 2020-10-09 MED ORDER — KETOCONAZOLE 2 % EX SHAM: 1.0000 "application " | MEDICATED_SHAMPOO | CUTANEOUS | 6 refills | Status: DC

## 2020-10-09 NOTE — Progress Notes (Signed)
Follow-Up Visit   Subjective  Carl Coffey is a 87 y.o. male who presents for the following: Total body skin exam (Hx of Aks, Hx of BCC R ant neck, hx of SCC IS R post earlobe, hx of Dysplastic R of midline epigastric). The patient presents for Total-Body Skin Exam (TBSE) for skin cancer screening and mole check.  The following portions of the chart were reviewed this encounter and updated as appropriate:   Tobacco  Allergies  Meds  Problems  Med Hx  Surg Hx  Fam Hx     Review of Systems:  No other skin or systemic complaints except as noted in HPI or Assessment and Plan.  Objective  Well appearing patient in no apparent distress; mood and affect are within normal limits.  A full examination was performed including scalp, head, eyes, ears, nose, lips, neck, chest, axillae, abdomen, back, buttocks, bilateral upper extremities, bilateral lower extremities, hands, feet, fingers, toes, fingernails, and toenails. All findings within normal limits unless otherwise noted below.  R ant neck Well healed scar with no evidence of recurrence.   R post earlobe Well healed scar with no evidence of recurrence  R of midline epigastric Scar with no evidence of recurrence.   posterior neck Dilated pore  face x 7 (7) Pink scaly macules   R dorsum hand 1.1cm hyperkeratotic crusted pap  bil hands x 7, Total = 7 (7) Erythematous keratotic or waxy stuck-on papule or plaque.   Pubic Cystic pap   Assessment & Plan   Lentigines - Scattered tan macules - Due to sun exposure - Benign-appering, observe - Recommend daily broad spectrum sunscreen SPF 30+ to sun-exposed areas, reapply every 2 hours as needed. - Call for any changes  Seborrheic Keratoses - Stuck-on, waxy, tan-brown papules and/or plaques  - Benign-appearing - Discussed benign etiology and prognosis. - Observe - Call for any changes  Melanocytic Nevi - Tan-brown and/or pink-flesh-colored symmetric macules and  papules - Benign appearing on exam today - Observation - Call clinic for new or changing moles - Recommend daily use of broad spectrum spf 30+ sunscreen to sun-exposed areas.   Hemangiomas - Red papules - Discussed benign nature - Observe - Call for any changes  Actinic Damage - Chronic condition, secondary to cumulative UV/sun exposure - diffuse scaly erythematous macules with underlying dyspigmentation - Recommend daily broad spectrum sunscreen SPF 30+ to sun-exposed areas, reapply every 2 hours as needed.  - Staying in the shade or wearing long sleeves, sun glasses (UVA+UVB protection) and wide brim hats (4-inch brim around the entire circumference of the hat) are also recommended for sun protection.  - Call for new or changing lesions.  Skin cancer screening performed today.  History of basal cell carcinoma (BCC) R ant neck  Clear. Observe for recurrence. Call clinic for new or changing lesions.  Recommend regular skin exams, daily broad-spectrum spf 30+ sunscreen use, and photoprotection.    History of squamous cell carcinoma in situ (SCCIS) of skin R post earlobe  Clear. Observe for recurrence. Call clinic for new or changing lesions.  Recommend regular skin exams, daily broad-spectrum spf 30+ sunscreen use, and photoprotection.    History of dysplastic nevus R of midline epigastric  Clear. Observe for recurrence. Call clinic for new or changing lesions.  Recommend regular skin exams, daily broad-spectrum spf 30+ sunscreen use, and photoprotection.    Dilated pore of Winer posterior neck  Benign, observe  Seborrheic dermatitis Scalp  Seborrheic Dermatitis  -  is a chronic persistent rash characterized by pinkness and scaling most commonly of the mid face but also can occur on the scalp (dandruff), ears; mid chest and mid back. It tends to be exacerbated by stress and cooler weather.  People who have neurologic disease may experience new onset or exacerbation of  existing seborrheic dermatitis.  The condition is not curable but treatable and can be controlled.  Cont H&S qo shampoo Start Ketoconazole 2% shampoo 3x/wk, let sit 5 minutes before rinsing out  ketoconazole (NIZORAL) 2 % shampoo - Scalp Apply 1 application topically 3 (three) times a week. shampoo scalp 3x/wk, let sit 5 minutes before rinsing out  AK (actinic keratosis) (7) face x 7  Destruction of lesion - face x 7 Complexity: simple   Destruction method: cryotherapy   Informed consent: discussed and consent obtained   Timeout:  patient name, date of birth, surgical site, and procedure verified Lesion destroyed using liquid nitrogen: Yes   Region frozen until ice ball extended beyond lesion: Yes   Outcome: patient tolerated procedure well with no complications   Post-procedure details: wound care instructions given    Neoplasm of skin R dorsum hand  Epidermal / dermal shaving  Lesion diameter (cm):  1.1 Informed consent: discussed and consent obtained   Timeout: patient name, date of birth, surgical site, and procedure verified   Procedure prep:  Patient was prepped and draped in usual sterile fashion Prep type:  Isopropyl alcohol Anesthesia: the lesion was anesthetized in a standard fashion   Anesthetic:  1% lidocaine w/ epinephrine 1-100,000 buffered w/ 8.4% NaHCO3 Instrument used: flexible razor blade   Hemostasis achieved with: pressure, aluminum chloride, Gelfoam and electrodesiccation   Outcome: patient tolerated procedure well   Post-procedure details: sterile dressing applied and wound care instructions given   Dressing type: bacitracin and pressure dressing    Destruction of lesion Complexity: extensive   Destruction method: electrodesiccation and curettage   Informed consent: discussed and consent obtained   Timeout:  patient name, date of birth, surgical site, and procedure verified Procedure prep:  Patient was prepped and draped in usual sterile fashion Prep  type:  Isopropyl alcohol Anesthesia: the lesion was anesthetized in a standard fashion   Anesthetic:  1% lidocaine w/ epinephrine 1-100,000 buffered w/ 8.4% NaHCO3 Curettage performed in three different directions: Yes   Electrodesiccation performed over the curetted area: Yes   Lesion length (cm):  1.1 Lesion width (cm):  1.1 Margin per side (cm):  0.2 Final wound size (cm):  1.5 Hemostasis achieved with:  pressure, aluminum chloride, Gelfoam and electrodesiccation Outcome: patient tolerated procedure well with no complications   Post-procedure details: sterile dressing applied and wound care instructions given   Dressing type: bacitracin and pressure dressing    Specimen 1 - Surgical pathology DDifferential Diagnosis: D48.5 R/O SCC  Check Margins: No 1.1cm hyperkeratotic crusted pap EDC today  Inflamed seborrheic keratosis bil hands x 7, Total = 7  Destruction of lesion - bil hands x 7, Total = 7 Complexity: simple   Destruction method: cryotherapy   Informed consent: discussed and consent obtained   Timeout:  patient name, date of birth, surgical site, and procedure verified Lesion destroyed using liquid nitrogen: Yes   Region frozen until ice ball extended beyond lesion: Yes   Outcome: patient tolerated procedure well with no complications   Post-procedure details: wound care instructions given    Epidermal cyst Pubic  Benign, observe  Skin cancer screening  Return in about 6 months (  around 04/11/2021) for AK f/u.  I, Othelia Pulling, RMA, am acting as scribe for Sarina Ser, MD . Documentation: I have reviewed the above documentation for accuracy and completeness, and I agree with the above.  Sarina Ser, MD

## 2020-10-09 NOTE — Patient Instructions (Addendum)
If you have any questions or concerns for your doctor, please call our main line at [phone] and press option 4 to reach your doctor's medical assistant. If no one answers, please leave a voicemail as directed and we will return your call as soon as possible. Messages left after 4 pm will be answered the following business day.   You may also send us a message via MyChart. We typically respond to MyChart messages within 1-2 business days.  For prescription refills, please ask your pharmacy to contact our office. Our fax number is [phone].  If you have an urgent issue when the clinic is closed that cannot wait until the next business day, you can page your doctor at the number below.    Please note that while we do our best to be available for urgent issues outside of office hours, we are not available 24/7.   If you have an urgent issue and are unable to reach us, you may choose to seek medical care at your doctor's office, retail clinic, urgent care center, or emergency room.  If you have a medical emergency, please immediately call 911 or go to the emergency department.  Pager Numbers  - Dr. Kowalski: [phone]  - Dr. Moye: [phone]  - Dr. Stewart: [phone]  In the event of inclement weather, please call our main line at [phone] for an update on the status of any delays or closures.  Dermatology Medication Tips: Please keep the boxes that topical medications come in in order to help keep track of the instructions about where and how to use these. Pharmacies typically print the medication instructions only on the boxes and not directly on the medication tubes.   If your medication is too expensive, please contact our office at [phone] option 4 or send us a message through MyChart.   We are unable to tell what your co-pay for medications will be in advance as this is different depending on your insurance coverage. However, we may be able to find a substitute  medication at lower cost or fill out paperwork to get insurance to cover a needed medication.   If a prior authorization is required to get your medication covered by your insurance company, please allow us 1-2 business days to complete this process.  Drug prices often vary depending on where the prescription is filled and some pharmacies may offer cheaper prices.  The website www.goodrx.com contains coupons for medications through different pharmacies. The prices here do not account for what the cost may be with help from insurance (it may be cheaper with your insurance), but the website can give you the price if you did not use any insurance.  - You can print the associated coupon and take it with your prescription to the pharmacy.  - You may also stop by our office during regular business hours and pick up a GoodRx coupon card.  - If you need your prescription sent electronically to a different pharmacy, notify our office through [facility] MyChart or by phone at [phone] option 4.   Wound Care Instructions  Cleanse wound gently with soap and water once a day then pat dry with clean gauze. Apply a thing coat of Petrolatum (petroleum jelly, "Vaseline") over the wound (unless you have an allergy to this). We recommend that you use a new, sterile tube of Vaseline. Do not pick or remove scabs. Do not remove the yellow or white "healing tissue" from the base of the wound.  Cover the   wound with fresh, clean, nonstick gauze and secure with paper tape. You may use Band-Aids in place of gauze and tape if the would is small enough, but would recommend trimming much of the tape off as there is often too much. Sometimes Band-Aids can irritate the skin.  You should call the office for your biopsy report after 1 week if you have not already been contacted.  If you experience any problems, such as abnormal amounts of bleeding, swelling, significant bruising, significant pain, or evidence of infection,  please call the office immediately.  FOR ADULT SURGERY PATIENTS: If you need something for pain relief you may take 1 extra strength Tylenol (acetaminophen) AND 2 Ibuprofen (200mg each) together every 4 hours as needed for pain. (do not take these if you are allergic to them or if you have a reason you should not take them.) Typically, you may only need pain medication for 1 to 3 days.    

## 2020-10-10 ENCOUNTER — Encounter: Payer: Self-pay | Admitting: Dermatology

## 2020-10-11 DIAGNOSIS — H26491 Other secondary cataract, right eye: Secondary | ICD-10-CM | POA: Diagnosis not present

## 2020-10-15 ENCOUNTER — Telehealth: Payer: Self-pay

## 2020-10-15 NOTE — Telephone Encounter (Signed)
-----   Message from Ralene Bathe, MD sent at 10/15/2020 12:16 PM EDT ----- Diagnosis Skin , right dorsum hand BASAL CELL CARCINOMA, NODULAR AND INFILTRATIVE PATTERNS, BASE INVOLVED  Cancer - BCC Already treated Recheck next visit

## 2020-10-15 NOTE — Telephone Encounter (Signed)
Advised patient of results/hd  

## 2020-10-25 DIAGNOSIS — I129 Hypertensive chronic kidney disease with stage 1 through stage 4 chronic kidney disease, or unspecified chronic kidney disease: Secondary | ICD-10-CM | POA: Diagnosis not present

## 2020-10-25 DIAGNOSIS — I482 Chronic atrial fibrillation, unspecified: Secondary | ICD-10-CM | POA: Diagnosis not present

## 2020-10-25 DIAGNOSIS — N183 Chronic kidney disease, stage 3 unspecified: Secondary | ICD-10-CM | POA: Diagnosis not present

## 2020-10-25 DIAGNOSIS — E039 Hypothyroidism, unspecified: Secondary | ICD-10-CM | POA: Diagnosis not present

## 2020-10-25 DIAGNOSIS — I2510 Atherosclerotic heart disease of native coronary artery without angina pectoris: Secondary | ICD-10-CM | POA: Diagnosis not present

## 2020-11-25 DIAGNOSIS — I482 Chronic atrial fibrillation, unspecified: Secondary | ICD-10-CM | POA: Diagnosis not present

## 2020-12-24 ENCOUNTER — Ambulatory Visit (INDEPENDENT_AMBULATORY_CARE_PROVIDER_SITE_OTHER): Payer: PPO | Admitting: Urology

## 2020-12-24 ENCOUNTER — Encounter: Payer: Self-pay | Admitting: Urology

## 2020-12-24 ENCOUNTER — Other Ambulatory Visit: Payer: Self-pay

## 2020-12-24 VITALS — BP 151/79 | HR 63 | Ht 70.0 in | Wt 199.0 lb

## 2020-12-24 DIAGNOSIS — C679 Malignant neoplasm of bladder, unspecified: Secondary | ICD-10-CM

## 2020-12-24 NOTE — Progress Notes (Signed)
   12/24/20  CC:  Chief Complaint  Patient presents with   Cysto    HPI: Carl Coffey is a 87 y.o. male who presents today for a 3 month surveillance cystoscopy.   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  Received intravesical gemcitabine post op.    Most recently, he underwent fulguration of a very small possible early recurrence which was a 2 mm area adjacent to the left bladder neck at the inferior pole of a stellate scar on the lateral wall.  Elected to abstain from BCG.    No new urinary issues.  Urinalysis is unremarkable.   Blood pressure (!) 151/79, pulse 63, height 5\' 10"  (1.778 m), weight 199 lb (90.3 kg). NED. A&Ox3.   No respiratory distress   Abd soft, NT, ND  Normal phallus with bilateral descended testicles  Cystoscopy Procedure Note  Patient identification was confirmed, informed consent was obtained, and patient was prepped using Betadine solution.  Lidocaine jelly was administered per urethral meatus.     Pre-Procedure: - Inspection reveals a normal caliber ureteral meatus.  Procedure: The flexible cystoscope was introduced without difficulty - No urethral strictures/lesions are present. - Enlarged prostate trilobar coaptation,  - Elevated bladder neck - Bilateral ureteral orifices identified - Bladder mucosa   stellate scar on left lateral bladder wall.  - No bladder stones - Mild trabeculation  Retroflexion with slight intravesical protrusion but no discrete median lobe.  No recurrence appreciated today.   Post-Procedure: - Patient tolerated the procedure well  Assessment/ Plan:  - Urinalysis, Complete  1. History of bladder cancer No evidence of disease today  Possible small recurrence fulgurated 3 months ago but otherwise no recurrence since 2020  Given his age and comorbidities, it does seem reasonable to keep him on a every 6 month schedule at this time especially in the absence of recurrence today.  He is  agreeable this plan.  Urine cytology sent today.  Follow-up cystoscopy in 6 months or sooner if he develops any change in urinary symptoms  Hollice Espy, MD

## 2020-12-25 DIAGNOSIS — Z7901 Long term (current) use of anticoagulants: Secondary | ICD-10-CM | POA: Diagnosis not present

## 2020-12-25 DIAGNOSIS — I482 Chronic atrial fibrillation, unspecified: Secondary | ICD-10-CM | POA: Diagnosis not present

## 2020-12-25 LAB — MICROSCOPIC EXAMINATION

## 2020-12-25 LAB — URINALYSIS, COMPLETE
Bilirubin, UA: NEGATIVE
Glucose, UA: NEGATIVE
Leukocytes,UA: NEGATIVE
Nitrite, UA: NEGATIVE
Protein,UA: NEGATIVE
RBC, UA: NEGATIVE
Specific Gravity, UA: >1.03 — ABNORMAL HIGH (ref 1.005–1.030)
Urobilinogen, Ur: 1 mg/dL (ref 0.2–1.0)
pH, UA: 5.5 (ref 5.0–7.5)

## 2021-01-27 DIAGNOSIS — I482 Chronic atrial fibrillation, unspecified: Secondary | ICD-10-CM | POA: Diagnosis not present

## 2021-02-26 DIAGNOSIS — I482 Chronic atrial fibrillation, unspecified: Secondary | ICD-10-CM | POA: Diagnosis not present

## 2021-03-27 DIAGNOSIS — I2510 Atherosclerotic heart disease of native coronary artery without angina pectoris: Secondary | ICD-10-CM | POA: Diagnosis not present

## 2021-03-27 DIAGNOSIS — E782 Mixed hyperlipidemia: Secondary | ICD-10-CM | POA: Diagnosis not present

## 2021-03-27 DIAGNOSIS — E039 Hypothyroidism, unspecified: Secondary | ICD-10-CM | POA: Diagnosis not present

## 2021-03-27 DIAGNOSIS — Z0000 Encounter for general adult medical examination without abnormal findings: Secondary | ICD-10-CM | POA: Diagnosis not present

## 2021-03-27 DIAGNOSIS — I482 Chronic atrial fibrillation, unspecified: Secondary | ICD-10-CM | POA: Diagnosis not present

## 2021-03-27 DIAGNOSIS — N183 Chronic kidney disease, stage 3 unspecified: Secondary | ICD-10-CM | POA: Diagnosis not present

## 2021-03-27 DIAGNOSIS — I129 Hypertensive chronic kidney disease with stage 1 through stage 4 chronic kidney disease, or unspecified chronic kidney disease: Secondary | ICD-10-CM | POA: Diagnosis not present

## 2021-03-27 DIAGNOSIS — C679 Malignant neoplasm of bladder, unspecified: Secondary | ICD-10-CM | POA: Diagnosis not present

## 2021-04-10 ENCOUNTER — Other Ambulatory Visit: Payer: Self-pay

## 2021-04-10 ENCOUNTER — Ambulatory Visit (INDEPENDENT_AMBULATORY_CARE_PROVIDER_SITE_OTHER): Payer: PPO | Admitting: Dermatology

## 2021-04-10 DIAGNOSIS — L820 Inflamed seborrheic keratosis: Secondary | ICD-10-CM | POA: Diagnosis not present

## 2021-04-10 DIAGNOSIS — L570 Actinic keratosis: Secondary | ICD-10-CM | POA: Diagnosis not present

## 2021-04-10 DIAGNOSIS — L578 Other skin changes due to chronic exposure to nonionizing radiation: Secondary | ICD-10-CM

## 2021-04-10 DIAGNOSIS — Z85828 Personal history of other malignant neoplasm of skin: Secondary | ICD-10-CM

## 2021-04-10 NOTE — Patient Instructions (Signed)

## 2021-04-10 NOTE — Progress Notes (Signed)
Follow-Up Visit   Subjective  Carl Coffey is a 88 y.o. male who presents for the following: Follow-up (F/u for AKs treated at last visit with Ln2. Also rechecking a BCC that was treated with EDC on right dorsum hand at last visit. ). The patient has spots, moles and lesions to be evaluated, some may be new or changing and the patient has concerns that these could be cancer.  The following portions of the chart were reviewed this encounter and updated as appropriate:  Tobacco   Allergies   Meds   Problems   Med Hx   Surg Hx   Fam Hx      Review of Systems: No other skin or systemic complaints except as noted in HPI or Assessment and Plan.  Objective  Well appearing patient in no apparent distress; mood and affect are within normal limits.  A focused examination was performed including face, neck, hands. Relevant physical exam findings are noted in the Assessment and Plan.  hands and left temple x 10, neck x 7 (17) Erythematous keratotic or waxy stuck-on papule or plaque.   face and ears x 15 (17) Erythematous thin papules/macules with gritty scale.    Assessment & Plan  Inflamed seborrheic keratosis (17) hands and left temple x 10, neck x 7 Irritating and symptomatic  Prior to procedure, discussed risks of blister formation, small wound, skin dyspigmentation, or rare scar following cryotherapy. Recommend Vaseline ointment to treated areas while healing.  Destruction of lesion - hands and left temple x 10, neck x 7 Complexity: simple   Destruction method: cryotherapy   Informed consent: discussed and consent obtained   Timeout:  patient name, date of birth, surgical site, and procedure verified Lesion destroyed using liquid nitrogen: Yes   Region frozen until ice ball extended beyond lesion: Yes   Outcome: patient tolerated procedure well with no complications   Post-procedure details: wound care instructions given    AK (actinic keratosis) (17) face and ears x  15  Actinic keratoses are precancerous spots that appear secondary to cumulative UV radiation exposure/sun exposure over time. They are chronic with expected duration over 1 year. A portion of actinic keratoses will progress to squamous cell carcinoma of the skin. It is not possible to reliably predict which spots will progress to skin cancer and so treatment is recommended to prevent development of skin cancer.  Recommend daily broad spectrum sunscreen SPF 30+ to sun-exposed areas, reapply every 2 hours as needed.  Recommend staying in the shade or wearing long sleeves, sun glasses (UVA+UVB protection) and wide brim hats (4-inch brim around the entire circumference of the hat). Call for new or changing lesions.  Prior to procedure, discussed risks of blister formation, small wound, skin dyspigmentation, or rare scar following cryotherapy. Recommend Vaseline ointment to treated areas while healing.  Destruction of lesion - face and ears x 15 Complexity: simple   Destruction method: cryotherapy   Informed consent: discussed and consent obtained   Timeout:  patient name, date of birth, surgical site, and procedure verified Lesion destroyed using liquid nitrogen: Yes   Region frozen until ice ball extended beyond lesion: Yes   Outcome: patient tolerated procedure well with no complications   Post-procedure details: wound care instructions given    History of Basal Cell Carcinoma of the Skin Right dorsum hand - No evidence of recurrence today - Recommend regular full body skin exams - Recommend daily broad spectrum sunscreen SPF 30+ to sun-exposed areas, reapply every  2 hours as needed.  - Call if any new or changing lesions are noted between office visits  Actinic Damage - Severe, confluent actinic changes with pre-cancerous actinic keratoses  - Severe, chronic, not at goal, secondary to cumulative UV radiation exposure over time - diffuse scaly erythematous macules and papules with  underlying dyspigmentation - Discussed Prescription "Field Treatment" for Severe, Chronic Confluent Actinic Changes with Pre-Cancerous Actinic Keratoses Field treatment involves treatment of an entire area of skin that has confluent Actinic Changes (Sun/ Ultraviolet light damage) and PreCancerous Actinic Keratoses by method of PhotoDynamic Therapy (PDT) and/or prescription Topical Chemotherapy agents such as 5-fluorouracil, 5-fluorouracil/calcipotriene, and/or imiquimod.  The purpose is to decrease the number of clinically evident and subclinical PreCancerous lesions to prevent progression to development of skin cancer by chemically destroying early precancer changes that may or may not be visible.  It has been shown to reduce the risk of developing skin cancer in the treated area. As a result of treatment, redness, scaling, crusting, and open sores may occur during treatment course. One or more than one of these methods may be used and may have to be used several times to control, suppress and eliminate the PreCancerous changes. Discussed treatment course, expected reaction, and possible side effects. - Recommend daily broad spectrum sunscreen SPF 30+ to sun-exposed areas, reapply every 2 hours as needed.  - Staying in the shade or wearing long sleeves, sun glasses (UVA+UVB protection) and wide brim hats (4-inch brim around the entire circumference of the hat) are also recommended. - Call for new or changing lesions. - Schedule PDT to face in 1 month  Return in about 6 months (around 10/08/2021) for AK f/u, 1 mo PDT to face.  IHarriett Sine, CMA, am acting as scribe for Sarina Ser, MD. Documentation: I have reviewed the above documentation for accuracy and completeness, and I agree with the above.  Sarina Ser, MD

## 2021-04-13 ENCOUNTER — Emergency Department: Admit: 2021-04-13 | Payer: PPO | Source: Home / Self Care

## 2021-04-14 ENCOUNTER — Encounter: Payer: Self-pay | Admitting: Dermatology

## 2021-04-28 DIAGNOSIS — I482 Chronic atrial fibrillation, unspecified: Secondary | ICD-10-CM | POA: Diagnosis not present

## 2021-05-13 ENCOUNTER — Ambulatory Visit (INDEPENDENT_AMBULATORY_CARE_PROVIDER_SITE_OTHER): Payer: PPO | Admitting: Dermatology

## 2021-05-13 ENCOUNTER — Other Ambulatory Visit: Payer: Self-pay

## 2021-05-13 DIAGNOSIS — L570 Actinic keratosis: Secondary | ICD-10-CM | POA: Diagnosis not present

## 2021-05-13 MED ORDER — AMINOLEVULINIC ACID HCL 20 % EX SOLR
1.0000 "application " | Freq: Once | CUTANEOUS | Status: AC
  Administered 2021-05-13: 354 mg via TOPICAL

## 2021-05-13 NOTE — Patient Instructions (Signed)

## 2021-05-13 NOTE — Progress Notes (Signed)
Patient completed PDT therapy today.  1. AK (actinic keratosis) Head - Anterior (Face)  Photodynamic therapy - Head - Anterior (Face) Procedure discussed: discussed risks, benefits, side effects. and alternatives   Prep: site scrubbed/prepped with acetone   Location:  Face Number of lesions:  Multiple Type of treatment:  Blue light Aminolevulinic Acid (see MAR for details): Levulan Number of Levulan sticks used:  1 Incubation time (minutes):  60 Number of minutes under lamp:  16 Number of seconds under lamp:  40 Cooling:  Floor fan Outcome: patient tolerated procedure well with no complications   Post-procedure details: sunscreen applied    Aminolevulinic Acid HCl 20 % SOLR 354 mg - Head - Anterior (Face)   Patient provided samples of Solbar Suncreen and Vanicream face wash and moisturizer.   Documentation: I have reviewed the above documentation for accuracy and completeness, and I agree with the above.  Sarina Ser, MD

## 2021-05-14 DIAGNOSIS — N183 Chronic kidney disease, stage 3 unspecified: Secondary | ICD-10-CM | POA: Diagnosis not present

## 2021-05-14 DIAGNOSIS — E782 Mixed hyperlipidemia: Secondary | ICD-10-CM | POA: Diagnosis not present

## 2021-05-14 DIAGNOSIS — I482 Chronic atrial fibrillation, unspecified: Secondary | ICD-10-CM | POA: Diagnosis not present

## 2021-05-14 DIAGNOSIS — I2510 Atherosclerotic heart disease of native coronary artery without angina pectoris: Secondary | ICD-10-CM | POA: Diagnosis not present

## 2021-05-14 DIAGNOSIS — I129 Hypertensive chronic kidney disease with stage 1 through stage 4 chronic kidney disease, or unspecified chronic kidney disease: Secondary | ICD-10-CM | POA: Diagnosis not present

## 2021-05-15 ENCOUNTER — Encounter: Payer: Self-pay | Admitting: Dermatology

## 2021-05-26 DIAGNOSIS — I482 Chronic atrial fibrillation, unspecified: Secondary | ICD-10-CM | POA: Diagnosis not present

## 2021-05-26 DIAGNOSIS — Z7901 Long term (current) use of anticoagulants: Secondary | ICD-10-CM | POA: Diagnosis not present

## 2021-06-23 NOTE — Progress Notes (Signed)
? ?  06/24/21 ?CC:  ?Chief Complaint  ?Patient presents with  ? Cysto  ? ? ?HPI: ?Carl Coffey is a 88 y.o. male with a personal history of bladder cancer, who presents today for a surveillance cystoscopy.  ? ?History of 2 cm HgTa TCC s/p TURBT 02/2019; B RTG negative 12/20.  Received intravesical gemcitabine post op.   ?  ?Most recently, he underwent fulguration of a very small possible early recurrence which was a 2 mm area adjacent to the left bladder neck at the inferior pole of a stellate scar on the lateral wall. ?  ?Elected to abstain from BCG.  ? ?He is doing well and has no urinary complaints today.  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 ? ?Vitals:  ? 06/24/21 1039  ?BP: (!) 144/70  ?Pulse: 78  ? ?NED. A&Ox3.   ?No respiratory distress   ?Abd soft, NT, ND ?Normal phallus with bilateral descended testicles ? ?Cystoscopy Procedure Note ? ?Patient identification was confirmed, informed consent was obtained, and patient was prepped using Betadine solution.  Lidocaine jelly was administered per urethral meatus.   ? ? ?Pre-Procedure: ?- Inspection reveals a normal caliber ureteral meatus. ? ?Procedure: ?The flexible cystoscope was introduced without difficulty ?- No urethral strictures/lesions are present. ?- Enlarged prostate  prostate trilobar coaptation,  ?- Elevated bladder neck ?- Bilateral ureteral orifices identified ?- Bladder mucosa stellate scar on left lateral bladder wall.  ?- No bladder stones ?- No trabeculation ? ?Retroflexion shows with slight intravesical protrusion but no discrete median lobe.  No recurrence appreciated today ? ? ?Post-Procedure: ?- Patient tolerated the procedure well ? ? ?Assessment/ Plan: ? ?1. History of bladder cancer ?- No evidence of disease today ?- Given age and comorbidity will continue a 6q month interval.  ? ? ?Conley Rolls as a scribe for Carl Espy, MD.,have documented  all relevant documentation on the behalf of Carl Espy, MD,as directed by  Carl Espy, MD while in the presence of Carl Espy, MD. ? ?I have reviewed the above documentation for accuracy and completeness, and I agree with the above.  ? ?Carl Espy, MD ? ? ?

## 2021-06-24 ENCOUNTER — Ambulatory Visit: Payer: PPO | Admitting: Urology

## 2021-06-24 ENCOUNTER — Encounter: Payer: Self-pay | Admitting: Urology

## 2021-06-24 VITALS — BP 144/70 | HR 78 | Ht 70.0 in | Wt 187.0 lb

## 2021-06-24 DIAGNOSIS — C679 Malignant neoplasm of bladder, unspecified: Secondary | ICD-10-CM

## 2021-06-24 LAB — URINALYSIS, COMPLETE
Bilirubin, UA: NEGATIVE
Glucose, UA: NEGATIVE
Nitrite, UA: NEGATIVE
Protein,UA: NEGATIVE
RBC, UA: NEGATIVE
Specific Gravity, UA: 1.02 (ref 1.005–1.030)
Urobilinogen, Ur: 2 mg/dL — ABNORMAL HIGH (ref 0.2–1.0)
pH, UA: 6 (ref 5.0–7.5)

## 2021-06-24 LAB — MICROSCOPIC EXAMINATION: Bacteria, UA: NONE SEEN

## 2021-06-26 DIAGNOSIS — I482 Chronic atrial fibrillation, unspecified: Secondary | ICD-10-CM | POA: Diagnosis not present

## 2021-07-01 DIAGNOSIS — S76311A Strain of muscle, fascia and tendon of the posterior muscle group at thigh level, right thigh, initial encounter: Secondary | ICD-10-CM | POA: Diagnosis not present

## 2021-07-01 DIAGNOSIS — I482 Chronic atrial fibrillation, unspecified: Secondary | ICD-10-CM | POA: Diagnosis not present

## 2021-07-10 DIAGNOSIS — I482 Chronic atrial fibrillation, unspecified: Secondary | ICD-10-CM | POA: Diagnosis not present

## 2021-08-05 DIAGNOSIS — S86111A Strain of other muscle(s) and tendon(s) of posterior muscle group at lower leg level, right leg, initial encounter: Secondary | ICD-10-CM | POA: Diagnosis not present

## 2021-08-05 DIAGNOSIS — I482 Chronic atrial fibrillation, unspecified: Secondary | ICD-10-CM | POA: Diagnosis not present

## 2021-08-13 DIAGNOSIS — I482 Chronic atrial fibrillation, unspecified: Secondary | ICD-10-CM | POA: Diagnosis not present

## 2021-09-03 DIAGNOSIS — I482 Chronic atrial fibrillation, unspecified: Secondary | ICD-10-CM | POA: Diagnosis not present

## 2021-09-24 DIAGNOSIS — I2510 Atherosclerotic heart disease of native coronary artery without angina pectoris: Secondary | ICD-10-CM | POA: Diagnosis not present

## 2021-09-24 DIAGNOSIS — E039 Hypothyroidism, unspecified: Secondary | ICD-10-CM | POA: Diagnosis not present

## 2021-09-24 DIAGNOSIS — N183 Chronic kidney disease, stage 3 unspecified: Secondary | ICD-10-CM | POA: Diagnosis not present

## 2021-09-24 DIAGNOSIS — I482 Chronic atrial fibrillation, unspecified: Secondary | ICD-10-CM | POA: Diagnosis not present

## 2021-09-24 DIAGNOSIS — E782 Mixed hyperlipidemia: Secondary | ICD-10-CM | POA: Diagnosis not present

## 2021-09-24 DIAGNOSIS — I129 Hypertensive chronic kidney disease with stage 1 through stage 4 chronic kidney disease, or unspecified chronic kidney disease: Secondary | ICD-10-CM | POA: Diagnosis not present

## 2021-10-08 ENCOUNTER — Ambulatory Visit: Payer: PPO | Admitting: Dermatology

## 2021-10-08 DIAGNOSIS — L814 Other melanin hyperpigmentation: Secondary | ICD-10-CM

## 2021-10-08 DIAGNOSIS — D1800 Hemangioma unspecified site: Secondary | ICD-10-CM | POA: Diagnosis not present

## 2021-10-08 DIAGNOSIS — C44311 Basal cell carcinoma of skin of nose: Secondary | ICD-10-CM

## 2021-10-08 DIAGNOSIS — L821 Other seborrheic keratosis: Secondary | ICD-10-CM | POA: Diagnosis not present

## 2021-10-08 DIAGNOSIS — D485 Neoplasm of uncertain behavior of skin: Secondary | ICD-10-CM

## 2021-10-08 DIAGNOSIS — Z1283 Encounter for screening for malignant neoplasm of skin: Secondary | ICD-10-CM | POA: Diagnosis not present

## 2021-10-08 DIAGNOSIS — Z85828 Personal history of other malignant neoplasm of skin: Secondary | ICD-10-CM | POA: Diagnosis not present

## 2021-10-08 DIAGNOSIS — D229 Melanocytic nevi, unspecified: Secondary | ICD-10-CM | POA: Diagnosis not present

## 2021-10-08 DIAGNOSIS — L570 Actinic keratosis: Secondary | ICD-10-CM

## 2021-10-08 DIAGNOSIS — L820 Inflamed seborrheic keratosis: Secondary | ICD-10-CM | POA: Diagnosis not present

## 2021-10-08 DIAGNOSIS — L578 Other skin changes due to chronic exposure to nonionizing radiation: Secondary | ICD-10-CM | POA: Diagnosis not present

## 2021-10-08 NOTE — Patient Instructions (Signed)
Wound Care Instructions  Cleanse wound gently with soap and water once a day then pat dry with clean gauze. Apply a thing coat of Petrolatum (petroleum jelly, "Vaseline") over the wound (unless you have an allergy to this). We recommend that you use a new, sterile tube of Vaseline. Do not pick or remove scabs. Do not remove the yellow or white "healing tissue" from the base of the wound.  Cover the wound with fresh, clean, nonstick gauze and secure with paper tape. You may use Band-Aids in place of gauze and tape if the would is small enough, but would recommend trimming much of the tape off as there is often too much. Sometimes Band-Aids can irritate the skin.  You should call the office for your biopsy report after 1 week if you have not already been contacted.  If you experience any problems, such as abnormal amounts of bleeding, swelling, significant bruising, significant pain, or evidence of infection, please call the office immediately.  FOR ADULT SURGERY PATIENTS: If you need something for pain relief you may take 1 extra strength Tylenol (acetaminophen) AND 2 Ibuprofen ('200mg'$  each) together every 4 hours as needed for pain. (do not take these if you are allergic to them or if you have a reason you should not take them.) Typically, you may only need pain medication for 1 to 3 days.    Cryotherapy Aftercare  Wash gently with soap and water everyday.   Apply Vaseline and Band-Aid daily until healed.     Due to recent changes in healthcare laws, you may see results of your pathology and/or laboratory studies on MyChart before the doctors have had a chance to review them. We understand that in some cases there may be results that are confusing or concerning to you. Please understand that not all results are received at the same time and often the doctors may need to interpret multiple results in order to provide you with the best plan of care or course of treatment. Therefore, we ask that you  please give [place] 2 business days to thoroughly review all your results before contacting the office for clarification. Should we see a critical lab result, you will be contacted sooner.   If You Need Anything After Your Visit  If you have any questions or concerns for your doctor, please call our main line at [phone] and press option 4 to reach your doctor's medical assistant. If no one answers, please leave a voicemail as directed and we will return your call as soon as possible. Messages left after 4 pm will be answered the following business day.   You may also send [place] a message via [place]. We typically respond to MyChart messages within 1-2 business days.  For prescription refills, please ask your pharmacy to contact our office. Our fax number is [phone].  If you have an urgent issue when the clinic is closed that cannot wait until the next business day, you can page your doctor at the number below.    Please note that while we do our best to be available for urgent issues outside of office hours, we are not available 24/7.   If you have an urgent issue and are unable to reach [place], you may choose to seek medical care at your doctor's office, retail clinic, urgent care center, or emergency room.  If you have a medical emergency, please immediately call 911 or go to the emergency department.  Pager Numbers  - Dr. Nehemiah Massed: [phone]  -  Dr. Moye: [phone]  - Dr. Stewart: [phone]  In the event of inclement weather, please call our main line at [phone] for an update on the status of any delays or closures.  Dermatology Medication Tips: Please keep the boxes that topical medications come in in order to help keep track of the instructions about where and how to use these. Pharmacies typically print the medication instructions only on the boxes and not directly on the medication tubes.   If your medication is too expensive, please contact our office at  [phone] option 4 or send us a message through MyChart.   We are unable to tell what your co-pay for medications will be in advance as this is different depending on your insurance coverage. However, we may be able to find a substitute medication at lower cost or fill out paperwork to get insurance to cover a needed medication.   If a prior authorization is required to get your medication covered by your insurance company, please allow us 1-2 business days to complete this process.  Drug prices often vary depending on where the prescription is filled and some pharmacies may offer cheaper prices.  The website www.goodrx.com contains coupons for medications through different pharmacies. The prices here do not account for what the cost may be with help from insurance (it may be cheaper with your insurance), but the website can give you the price if you did not use any insurance.  - You can print the associated coupon and take it with your prescription to the pharmacy.  - You may also stop by our office during regular business hours and pick up a GoodRx coupon card.  - If you need your prescription sent electronically to a different pharmacy, notify our office through [facility] MyChart or by phone at [phone] option 4.     Si Usted Necesita Algo Despus de Su Visita  Tambin puede enviarnos un mensaje a travs de MyChart. Por lo general respondemos a los mensajes de MyChart en el transcurso de 1 a 2 das hbiles.  Para renovar recetas, por favor pida a su farmacia que se ponga en contacto con nuestra oficina. Nuestro nmero de fax es el [phone].  Si tiene un asunto urgente cuando la clnica est cerrada y que no puede esperar hasta el siguiente da hbil, puede llamar/localizar a su doctor(a) al nmero que aparece a continuacin.   Por favor, tenga en cuenta que aunque hacemos todo lo posible para estar disponibles para asuntos urgentes fuera del horario de oficina, no estamos  disponibles las 24 horas del da, los 7 das de la semana.   Si tiene un problema urgente y no puede comunicarse con nosotros, puede optar por buscar atencin mdica  en el consultorio de su doctor(a), en una clnica privada, en un centro de atencin urgente o en una sala de emergencias.  Si tiene una emergencia mdica, por favor llame inmediatamente al 911 o vaya a la sala de emergencias.  Nmeros de bper  - Dr. Kowalski: [phone]  - Dra. Moye: [phone]  - Dra. Stewart: [phone]  En caso de inclemencias del tiempo, por favor llame a nuestra lnea principal al [phone] para una actualizacin sobre el estado de cualquier retraso o cierre.  Consejos para la medicacin en dermatologa: Por favor, guarde las cajas en las que vienen los medicamentos de uso tpico para ayudarle a seguir las instrucciones sobre dnde y cmo usarlos. Las farmacias generalmente imprimen las instrucciones del medicamento slo en las cajas y   no directamente en los tubos del medicamento.   Si su medicamento es muy caro, por favor, pngase en contacto con nuestra oficina llamando al [phone] y presione la opcin 4 o envenos un mensaje a travs de MyChart.   No podemos decirle cul ser su copago por los medicamentos por adelantado ya que esto es diferente dependiendo de la cobertura de su seguro. Sin embargo, es posible que podamos encontrar un medicamento sustituto a menor costo o llenar un formulario para que el seguro cubra el medicamento que se considera necesario.   Si se requiere una autorizacin previa para que su compaa de seguros cubra su medicamento, por favor permtanos de 1 a 2 das hbiles para completar este proceso.  Los precios de los medicamentos varan con frecuencia dependiendo del lugar de dnde se surte la receta y alguna farmacias pueden ofrecer precios ms baratos.  El sitio web www.goodrx.com tiene cupones para medicamentos de diferentes farmacias. Los precios aqu no  tienen en cuenta lo que podra costar con la ayuda del seguro (puede ser ms barato con su seguro), pero el sitio web puede darle el precio si no utiliz ningn seguro.  - Puede imprimir el cupn correspondiente y llevarlo con su receta a la farmacia.  - Tambin puede pasar por nuestra oficina durante el horario de atencin regular y recoger una tarjeta de cupones de GoodRx.  - Si necesita que su receta se enve electrnicamente a una farmacia diferente, informe a nuestra oficina a travs de MyChart de [facility] o por telfono llamando al [phone] y presione la opcin 4.  

## 2021-10-08 NOTE — Progress Notes (Signed)
Follow-Up Visit   Subjective  Carl Coffey is a 88 y.o. male who presents for the following: Actinic Keratosis (6 month follow up - face, ears treated with LN2, face treated with PDT. The patient presents for Upper Body Skin Exam (UBSE) for skin cancer screening and mole check.  The patient has spots, moles and lesions to be evaluated, some may be new or changing and the patient has concerns that these could be cancer./).  The following portions of the chart were reviewed this encounter and updated as appropriate:   Tobacco  Allergies  Meds  Problems  Med Hx  Surg Hx  Fam Hx     Review of Systems:  No other skin or systemic complaints except as noted in HPI or Assessment and Plan.  Objective  Well appearing patient in no apparent distress; mood and affect are within normal limits.  All skin waist up examined.  Right back x 5, right mastoid x 1 (6) Erythematous stuck-on, waxy papule or plaque  Scalp x 2, forhead x 2 (4) Erythematous thin papules/macules with gritty scale.   Right mid dorsum nose 1.1 cm pink papule        Assessment & Plan   History of Basal Cell Carcinoma of the Skin - No evidence of recurrence today - Recommend regular full body skin exams - Recommend daily broad spectrum sunscreen SPF 30+ to sun-exposed areas, reapply every 2 hours as needed.  - Call if any new or changing lesions are noted between office visits  History of Squamous Cell Carcinoma of the Skin - No evidence of recurrence today - No lymphadenopathy - Recommend regular full body skin exams - Recommend daily broad spectrum sunscreen SPF 30+ to sun-exposed areas, reapply every 2 hours as needed.  - Call if any new or changing lesions are noted between office visits  Lentigines - Scattered tan macules - Due to sun exposure - Benign-appearing, observe - Recommend daily broad spectrum sunscreen SPF 30+ to sun-exposed areas, reapply every 2 hours as needed. - Call for any  changes  Seborrheic Keratoses - Stuck-on, waxy, tan-brown papules and/or plaques  - Benign-appearing - Discussed benign etiology and prognosis. - Observe - Call for any changes  Melanocytic Nevi - Tan-brown and/or pink-flesh-colored symmetric macules and papules - Benign appearing on exam today - Observation - Call clinic for new or changing moles - Recommend daily use of broad spectrum spf 30+ sunscreen to sun-exposed areas.   Hemangiomas - Red papules - Discussed benign nature - Observe - Call for any changes  Actinic Damage - Chronic condition, secondary to cumulative UV/sun exposure - diffuse scaly erythematous macules with underlying dyspigmentation - Recommend daily broad spectrum sunscreen SPF 30+ to sun-exposed areas, reapply every 2 hours as needed.  - Staying in the shade or wearing long sleeves, sun glasses (UVA+UVB protection) and wide brim hats (4-inch brim around the entire circumference of the hat) are also recommended for sun protection.  - Call for new or changing lesions.  Skin cancer screening performed today.  Inflamed seborrheic keratosis (6) Right back x 5, right mastoid x 1  Destruction of lesion - Right back x 5, right mastoid x 1 Complexity: simple   Destruction method: cryotherapy   Informed consent: discussed and consent obtained   Timeout:  patient name, date of birth, surgical site, and procedure verified Lesion destroyed using liquid nitrogen: Yes   Region frozen until ice ball extended beyond lesion: Yes   Outcome: patient tolerated procedure well  with no complications   Post-procedure details: wound care instructions given    AK (actinic keratosis) (4) Scalp x 2, forhead x 2  Destruction of lesion - Scalp x 2, forhead x 2 Complexity: simple   Destruction method: cryotherapy   Informed consent: discussed and consent obtained   Timeout:  patient name, date of birth, surgical site, and procedure verified Lesion destroyed using liquid  nitrogen: Yes   Region frozen until ice ball extended beyond lesion: Yes   Outcome: patient tolerated procedure well with no complications   Post-procedure details: wound care instructions given    Neoplasm of uncertain behavior of skin Right mid dorsum nose  Epidermal / dermal shaving  Lesion diameter (cm):  0.7 Informed consent: discussed and consent obtained   Timeout: patient name, date of birth, surgical site, and procedure verified   Procedure prep:  Patient was prepped and draped in usual sterile fashion Prep type:  Isopropyl alcohol Anesthesia: the lesion was anesthetized in a standard fashion   Anesthetic:  1% lidocaine w/ epinephrine 1-100,000 buffered w/ 8.4% NaHCO3 Instrument used: flexible razor blade   Hemostasis achieved with: pressure, aluminum chloride and electrodesiccation   Outcome: patient tolerated procedure well   Post-procedure details: sterile dressing applied and wound care instructions given   Dressing type: bandage and petrolatum    Destruction of lesion Complexity: extensive   Destruction method: electrodesiccation and curettage   Informed consent: discussed and consent obtained   Timeout:  patient name, date of birth, surgical site, and procedure verified Procedure prep:  Patient was prepped and draped in usual sterile fashion Prep type:  Isopropyl alcohol Anesthesia: the lesion was anesthetized in a standard fashion   Anesthetic:  1% lidocaine w/ epinephrine 1-100,000 buffered w/ 8.4% NaHCO3 Curettage performed in three different directions: Yes   Electrodesiccation performed over the curetted area: Yes   Lesion length (cm):  1.1 Lesion width (cm):  1.1 Margin per side (cm):  0.2 Final wound size (cm):  1.5 Hemostasis achieved with:  pressure and aluminum chloride Outcome: patient tolerated procedure well with no complications   Post-procedure details: sterile dressing applied and wound care instructions given   Dressing type: bandage and  petrolatum    Specimen 1 - Surgical pathology Differential Diagnosis: BCC vs other  Check Margins: No EDC today  Basal cell carcinoma (BCC) of dorsum of nose   Return in about 6 months (around 04/10/2022).  I, Ashok Cordia, CMA, am acting as scribe for Sarina Ser, MD . Documentation: I have reviewed the above documentation for accuracy and completeness, and I agree with the above.  Sarina Ser, MD

## 2021-10-12 ENCOUNTER — Encounter: Payer: Self-pay | Admitting: Dermatology

## 2021-10-13 ENCOUNTER — Telehealth: Payer: Self-pay

## 2021-10-13 NOTE — Telephone Encounter (Signed)
-----   Message from Ralene Bathe, MD sent at 10/10/2021  4:37 PM EDT ----- Diagnosis Skin , right mid dorsum nose BASAL CELL CARCINOMA, NODULAR PATTERN, ULCERATED  Cancer - BCC Already treated Recheck next visit

## 2021-10-13 NOTE — Telephone Encounter (Signed)
Discussed pathology results with patient. BCC, Tx at visit. Recheck at next visit. Patient voiced understanding. JP

## 2021-10-15 DIAGNOSIS — H26491 Other secondary cataract, right eye: Secondary | ICD-10-CM | POA: Diagnosis not present

## 2021-10-19 IMAGING — US US RENAL
1 series · 14 of 25 positions shown · non-contrast
Comparison: April 06, 2011

CLINICAL DATA: Gross hematuria

EXAM:
RENAL / URINARY TRACT ULTRASOUND COMPLETE

[Series 1: us renal · 14 of 60 slices shown]
[im 1/60]
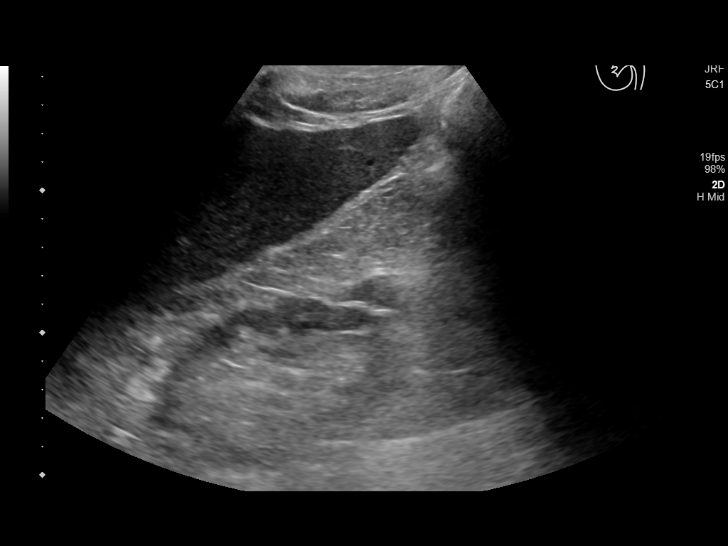
[im 5/60]
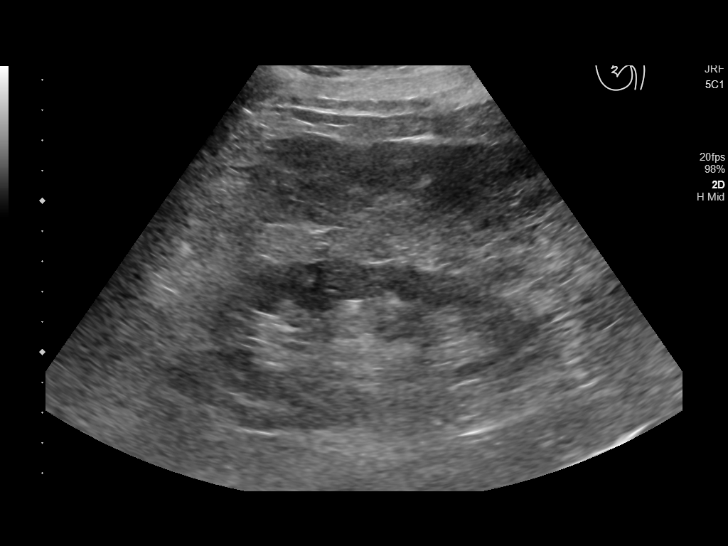
[im 10/60]
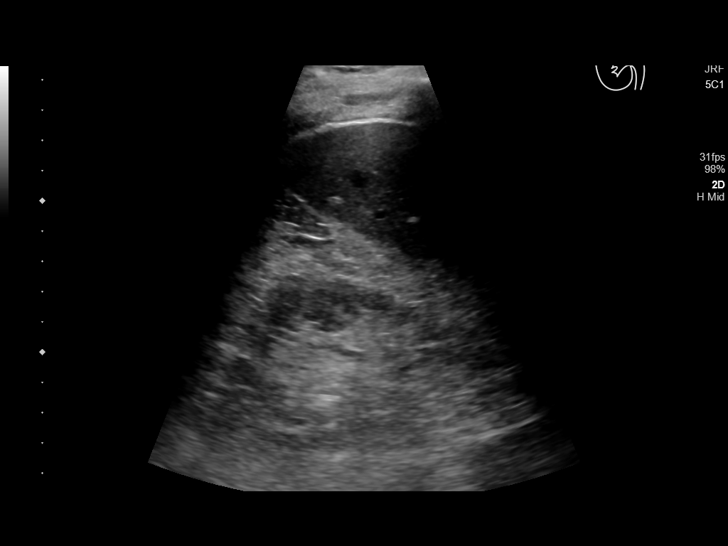
[im 15/60]
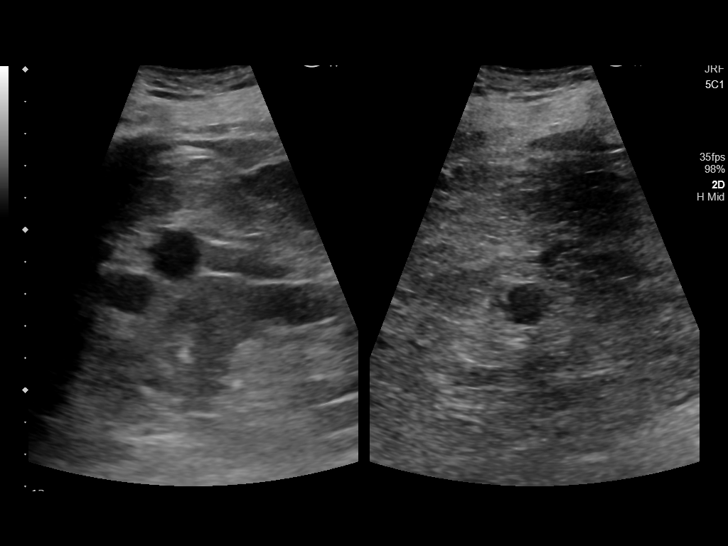
[im 20/60]
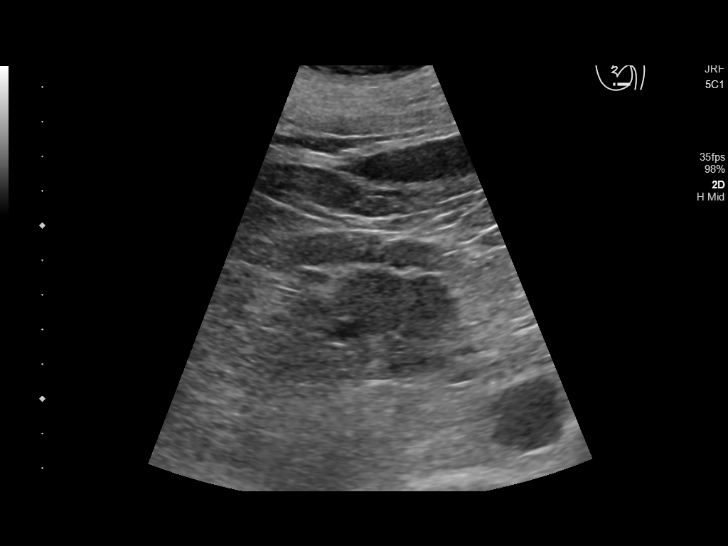
[im 23/60]
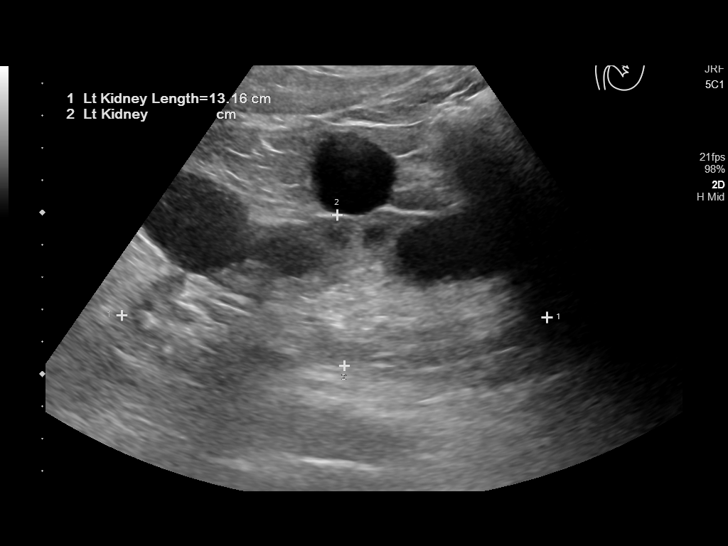
[im 28/60]
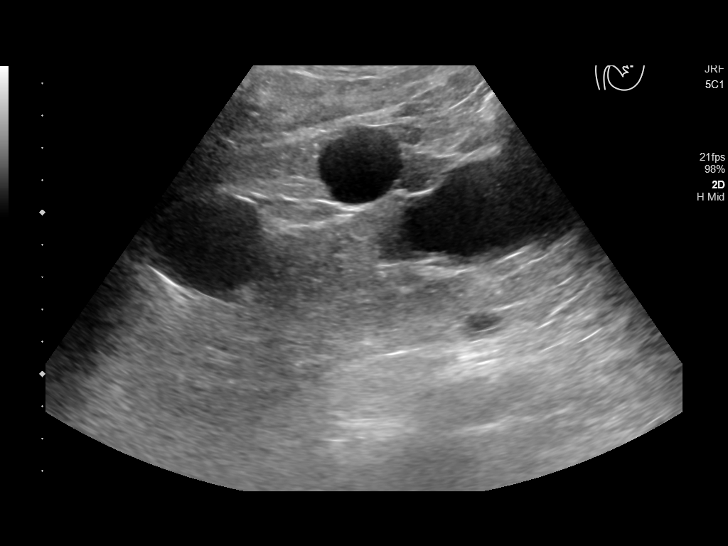
[im 32/60]
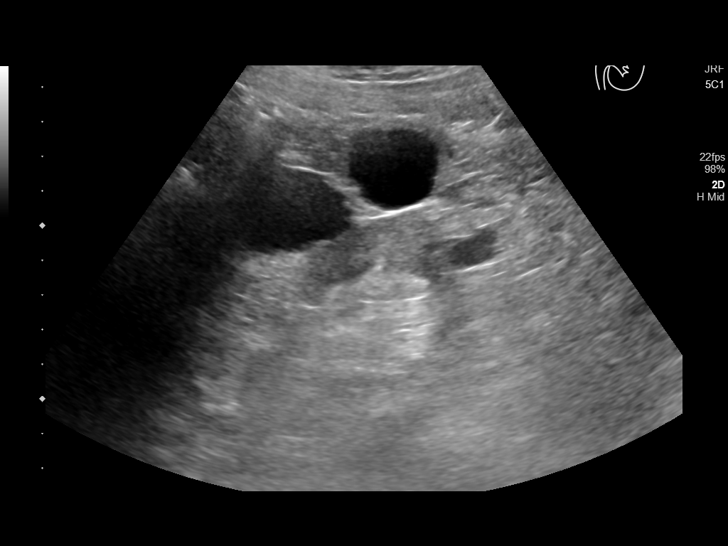
[im 37/60]
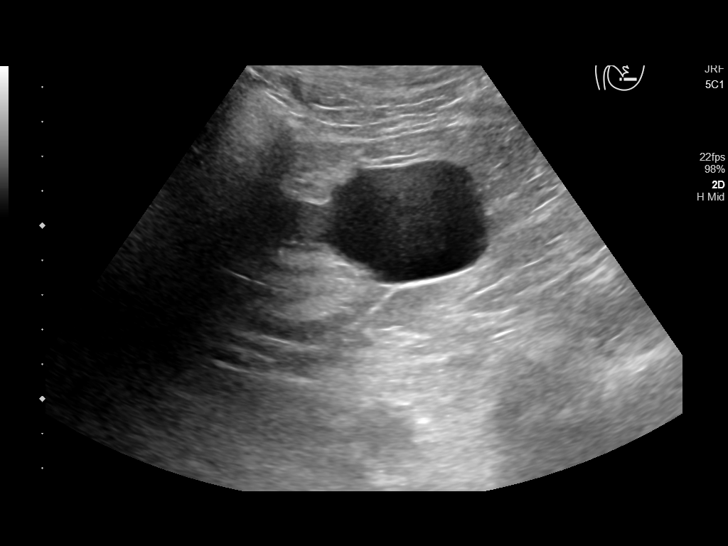
[im 40/60]
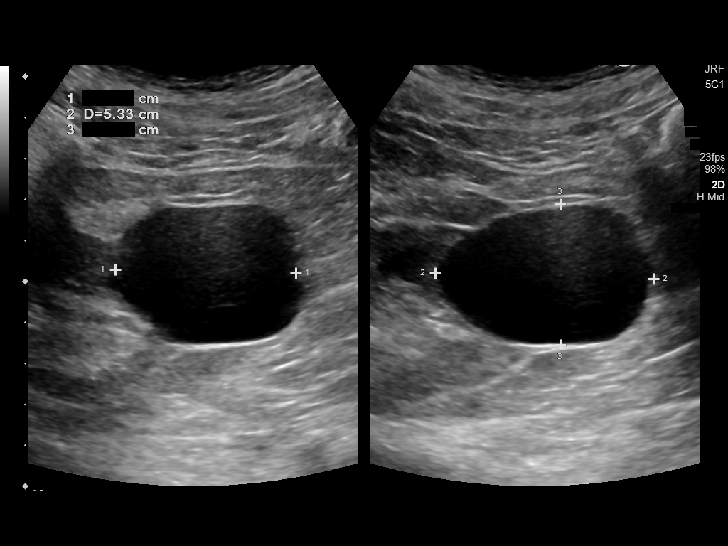
[im 45/60]
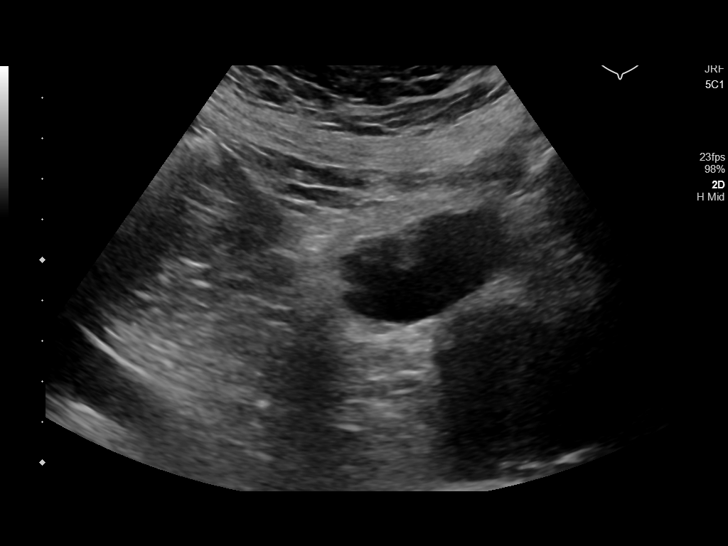
[im 50/60]
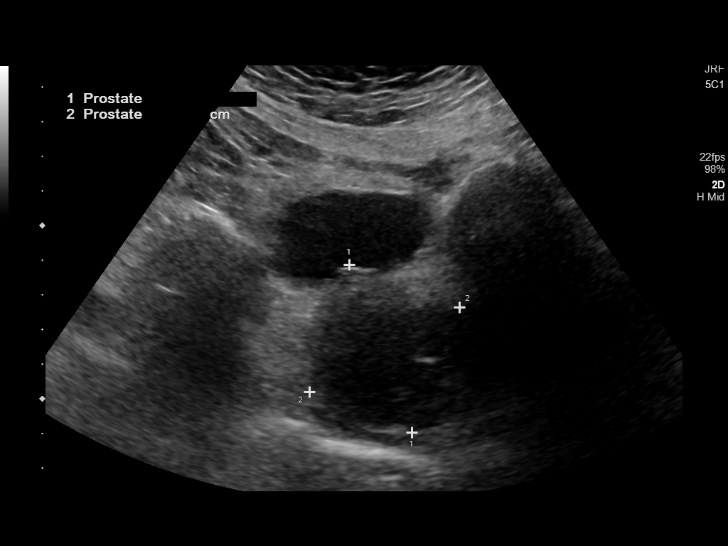
[im 55/60]
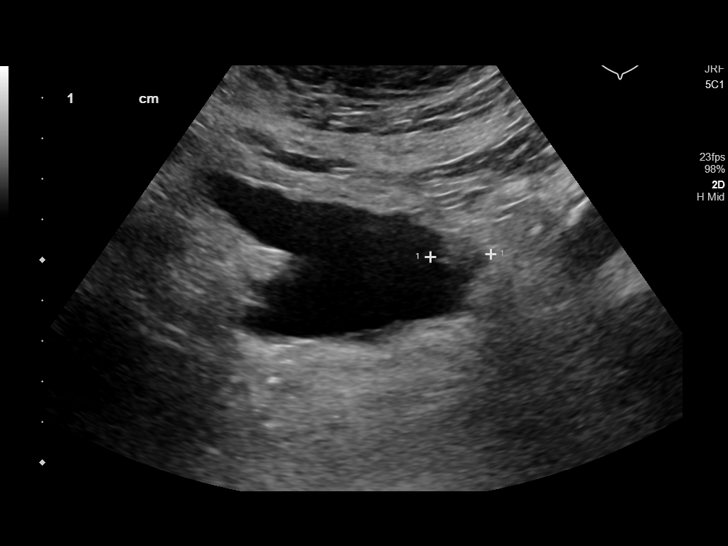
[im 60/60]
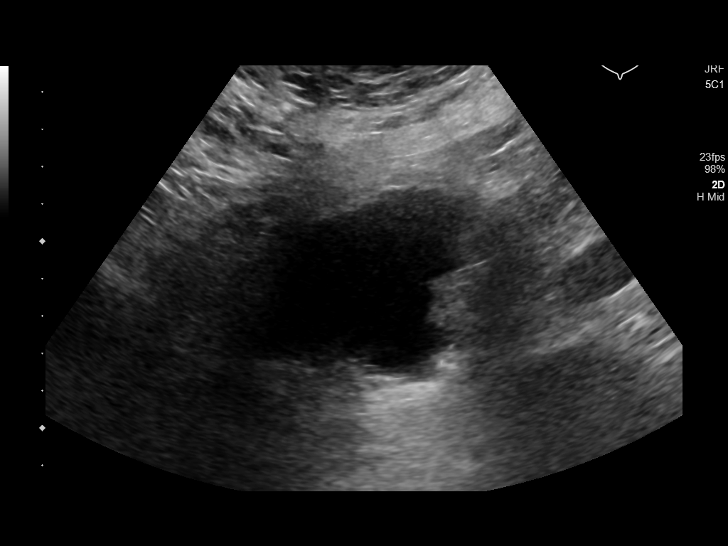

[14 of 25 positions shown; findings below may reference images not displayed]

FINDINGS: Right Kidney:

Renal measurements: 11.7 x 4.7 x 5.2 cm = volume: 151 mL. No
hydronephrosis. Multiple cysts are noted measuring up to
approximately 1.6 cm.

Left Kidney:

Renal measurements: 13.2 x 4.7 x 4.6 cm = volume: 147 mL. Multiple
cysts are noted measuring up to approximately 4.9 cm. There is no
hydronephrosis.

Bladder:

There appears to be a hyperechoic mass along the left bladder
sidewall measuring approximately 1.9 x 1.1 x 1.5 cm

Other:

The prostate gland is enlarged measuring 72 mL in volume.
IMPRESSION: 1. No hydronephrosis.  Bilateral renal cysts are noted.
2. Findings concerning for a 1.9 cm bladder mass along the left
bladder wall. Further evaluation with cystoscopy is recommended.
3. Prostatomegaly.

## 2021-10-22 DIAGNOSIS — I482 Chronic atrial fibrillation, unspecified: Secondary | ICD-10-CM | POA: Diagnosis not present

## 2021-11-19 DIAGNOSIS — I482 Chronic atrial fibrillation, unspecified: Secondary | ICD-10-CM | POA: Diagnosis not present

## 2021-11-19 DIAGNOSIS — Z7901 Long term (current) use of anticoagulants: Secondary | ICD-10-CM | POA: Diagnosis not present

## 2021-12-13 DIAGNOSIS — J019 Acute sinusitis, unspecified: Secondary | ICD-10-CM | POA: Diagnosis not present

## 2021-12-14 ENCOUNTER — Emergency Department: Payer: PPO

## 2021-12-14 ENCOUNTER — Other Ambulatory Visit: Payer: Self-pay

## 2021-12-14 ENCOUNTER — Observation Stay
Admission: EM | Admit: 2021-12-14 | Discharge: 2021-12-15 | Disposition: A | Discharge status: Home / Self Care | Payer: PPO | Attending: Internal Medicine | Admitting: Internal Medicine

## 2021-12-14 DIAGNOSIS — R0602 Shortness of breath: Secondary | ICD-10-CM | POA: Diagnosis not present

## 2021-12-14 DIAGNOSIS — E039 Hypothyroidism, unspecified: Secondary | ICD-10-CM | POA: Diagnosis present

## 2021-12-14 DIAGNOSIS — I2510 Atherosclerotic heart disease of native coronary artery without angina pectoris: Secondary | ICD-10-CM | POA: Diagnosis not present

## 2021-12-14 DIAGNOSIS — Z79899 Other long term (current) drug therapy: Secondary | ICD-10-CM | POA: Insufficient documentation

## 2021-12-14 DIAGNOSIS — I10 Essential (primary) hypertension: Secondary | ICD-10-CM | POA: Diagnosis not present

## 2021-12-14 DIAGNOSIS — Z7982 Long term (current) use of aspirin: Secondary | ICD-10-CM | POA: Diagnosis not present

## 2021-12-14 DIAGNOSIS — J9601 Acute respiratory failure with hypoxia: Secondary | ICD-10-CM | POA: Diagnosis not present

## 2021-12-14 DIAGNOSIS — I517 Cardiomegaly: Secondary | ICD-10-CM | POA: Diagnosis not present

## 2021-12-14 DIAGNOSIS — J209 Acute bronchitis, unspecified: Principal | ICD-10-CM | POA: Diagnosis present

## 2021-12-14 DIAGNOSIS — Z85828 Personal history of other malignant neoplasm of skin: Secondary | ICD-10-CM | POA: Insufficient documentation

## 2021-12-14 DIAGNOSIS — B9710 Unspecified enterovirus as the cause of diseases classified elsewhere: Secondary | ICD-10-CM | POA: Insufficient documentation

## 2021-12-14 DIAGNOSIS — Z1152 Encounter for screening for COVID-19: Secondary | ICD-10-CM | POA: Insufficient documentation

## 2021-12-14 DIAGNOSIS — R059 Cough, unspecified: Secondary | ICD-10-CM | POA: Diagnosis not present

## 2021-12-14 DIAGNOSIS — Z7901 Long term (current) use of anticoagulants: Secondary | ICD-10-CM | POA: Insufficient documentation

## 2021-12-14 DIAGNOSIS — B9789 Other viral agents as the cause of diseases classified elsewhere: Secondary | ICD-10-CM | POA: Insufficient documentation

## 2021-12-14 DIAGNOSIS — Z9889 Other specified postprocedural states: Secondary | ICD-10-CM | POA: Diagnosis not present

## 2021-12-14 DIAGNOSIS — J069 Acute upper respiratory infection, unspecified: Secondary | ICD-10-CM | POA: Diagnosis not present

## 2021-12-14 DIAGNOSIS — I482 Chronic atrial fibrillation, unspecified: Secondary | ICD-10-CM | POA: Diagnosis present

## 2021-12-14 DIAGNOSIS — I700 Atherosclerosis of aorta: Secondary | ICD-10-CM | POA: Diagnosis not present

## 2021-12-14 DIAGNOSIS — Z20822 Contact with and (suspected) exposure to covid-19: Secondary | ICD-10-CM | POA: Insufficient documentation

## 2021-12-14 LAB — CBC WITH DIFFERENTIAL/PLATELET
Abs Immature Granulocytes: 0.05 10*3/uL (ref 0.00–0.07)
Basophils Absolute: 0 10*3/uL (ref 0.0–0.1)
Basophils Relative: 0 %
Eosinophils Absolute: 0 10*3/uL (ref 0.0–0.5)
Eosinophils Relative: 0 %
HCT: 43.5 % (ref 39.0–52.0)
Hemoglobin: 14.4 g/dL (ref 13.0–17.0)
Immature Granulocytes: 0 %
Lymphocytes Relative: 4 %
Lymphs Abs: 0.5 10*3/uL — ABNORMAL LOW (ref 0.7–4.0)
MCH: 31.9 pg (ref 26.0–34.0)
MCHC: 33.1 g/dL (ref 30.0–36.0)
MCV: 96.2 fL (ref 80.0–100.0)
Monocytes Absolute: 1.3 10*3/uL — ABNORMAL HIGH (ref 0.1–1.0)
Monocytes Relative: 10 %
Neutro Abs: 11.4 10*3/uL — ABNORMAL HIGH (ref 1.7–7.7)
Neutrophils Relative %: 86 %
Platelets: 173 10*3/uL (ref 150–400)
RBC: 4.52 MIL/uL (ref 4.22–5.81)
RDW: 13 % (ref 11.5–15.5)
WBC: 13.3 10*3/uL — ABNORMAL HIGH (ref 4.0–10.5)
nRBC: 0 % (ref 0.0–0.2)

## 2021-12-14 LAB — COMPREHENSIVE METABOLIC PANEL
ALT: 21 U/L (ref 0–44)
AST: 27 U/L (ref 15–41)
Albumin: 4.2 g/dL (ref 3.5–5.0)
Alkaline Phosphatase: 34 U/L — ABNORMAL LOW (ref 38–126)
Anion gap: 12 (ref 5–15)
BUN: 28 mg/dL — ABNORMAL HIGH (ref 8–23)
CO2: 21 mmol/L — ABNORMAL LOW (ref 22–32)
Calcium: 9 mg/dL (ref 8.9–10.3)
Chloride: 104 mmol/L (ref 98–111)
Creatinine, Ser: 0.99 mg/dL (ref 0.61–1.24)
GFR, Estimated: >60 mL/min (ref >60)
Glucose, Bld: 150 mg/dL — ABNORMAL HIGH (ref 70–99)
Potassium: 4 mmol/L (ref 3.5–5.1)
Sodium: 137 mmol/L (ref 135–145)
Total Bilirubin: 2 mg/dL — ABNORMAL HIGH (ref 0.3–1.2)
Total Protein: 6.9 g/dL (ref 6.5–8.1)

## 2021-12-14 LAB — SARS CORONAVIRUS 2 BY RT PCR: SARS Coronavirus 2 by RT PCR: NEGATIVE

## 2021-12-14 LAB — LACTIC ACID, PLASMA: Lactic Acid, Venous: 1.4 mmol/L (ref 0.5–1.9)

## 2021-12-14 MED ORDER — IOHEXOL 350 MG/ML SOLN
75.0000 mL | Freq: Once | INTRAVENOUS | Status: AC | PRN
  Administered 2021-12-14: 75 mL via INTRAVENOUS

## 2021-12-14 MED ORDER — IPRATROPIUM-ALBUTEROL 0.5-2.5 (3) MG/3ML IN SOLN
3.0000 mL | Freq: Once | RESPIRATORY_TRACT | Status: AC
  Administered 2021-12-14: 3 mL via RESPIRATORY_TRACT
  Filled 2021-12-14: qty 3

## 2021-12-14 NOTE — ED Triage Notes (Signed)
Pt arrives via POV from home with CC of cough that began two days ago. Associated runny nose and congestion. Pt was seen yesterday at [facility] walk-in clinic and prescribed doxycycline. Pt reports cough has continued to get worse. Pt denies CP and SOB, however, room air SpO2 is 89%. Pt placed on 2L .

## 2021-12-14 NOTE — ED Notes (Signed)
Warm blankets provided.

## 2021-12-14 NOTE — ED Provider Notes (Incomplete)
[facility] Provider Note    Event Date/Time   First MD Initiated Contact with Patient 12/14/21 2159     (approximate)   History   Cough   HPI  Author E Langhorst is a 88 y.o. male who presents to the emergency department today because of concerns for cough.  Symptoms started 2 days ago.  He went to urgent care yesterday and was prescribed doxycycline.  The patient states that he has continued to have cough.  He has also had congestion and rhinorrhea.  He denies any significant chest pain or shortness of breath.  Denies any history of any lung disease.  Members of his family have recently been sick although did test negative for COVID.     Physical Exam   Triage Vital Signs: ED Triage Vitals  Enc Vitals Group     BP 12/14/21 2055 138/67     Pulse Rate 12/14/21 2055 79     Resp 12/14/21 2055 (!) 22     Temp 12/14/21 2055 99.5 F (37.5 C)     Temp Source 12/14/21 2055 Oral     SpO2 12/14/21 2055 (!) 89 %     Weight 12/14/21 2056 190 lb (86.2 kg)     Height 12/14/21 2056 '5\' 10"'$  (1.778 m)     Head Circumference --      Peak Flow --      Pain Score 12/14/21 2056 0     Pain Loc --      Pain Edu? --      Excl. in [place]? --     Most recent vital signs: Vitals:   12/14/21 2055 12/14/21 2059  BP: 138/67   Pulse: 79   Resp: (!) 22   Temp: 99.5 F (37.5 C)   SpO2: (!) 89% 92%    General: Awake, alert, oriented. CV:  Good peripheral perfusion. Regular rate and rhythm. Resp:  Slightly increased work of breathing, diffuse expiratory wheezing. Abd:  No distention.    ED Results / Procedures / Treatments   Labs (all labs ordered are listed, but only abnormal results are displayed) Labs Reviewed  COMPREHENSIVE METABOLIC PANEL - Abnormal; Notable for the following components:      Result Value   CO2 21 (*)    Glucose, Bld 150 (*)    BUN 28 (*)    Alkaline Phosphatase 34 (*)    Total Bilirubin 2.0 (*)    All other components within normal limits   CBC WITH DIFFERENTIAL/PLATELET - Abnormal; Notable for the following components:   WBC 13.3 (*)    Neutro Abs 11.4 (*)    Lymphs Abs 0.5 (*)    Monocytes Absolute 1.3 (*)    All other components within normal limits  CULTURE, BLOOD (ROUTINE X 2)  CULTURE, BLOOD (ROUTINE X 2)  SARS CORONAVIRUS 2 BY RT PCR  LACTIC ACID, PLASMA  LACTIC ACID, PLASMA  URINALYSIS, ROUTINE W REFLEX MICROSCOPIC     EKG  I, Nance Pear, attending physician, personally viewed and interpreted this EKG  EKG Time: 2107 Rate: 74 Rhythm: atrial fibrillation Axis: left axis deviation Intervals: qtc 479 QRS: RBBB, LAFB ST changes: no st elevation Impression: abnormal ekg   RADIOLOGY I independently interpreted and visualized the CXR. My interpretation: No pneumonia Radiology interpretation:  IMPRESSION: No acute abnormality noted.   I independently interpreted and visualized the CT angio PE. My interpretation: *** Radiology interpretation: ***   PROCEDURES:  Critical Care performed: {CriticalCareYesNo:19197::"Yes, see critical care  procedure note(s)","No"}  Procedures   MEDICATIONS ORDERED IN ED: Medications - No data to display   IMPRESSION / MDM / [place] / ED COURSE  I reviewed the triage vital signs and the nursing notes.                              Differential diagnosis includes, but is not limited to, ***  Patient's presentation is most consistent with {EM COPA:27473}  {If the patient is on the monitor, remove the brackets and asterisks on the sentence below and remember to document it as a Procedure as well. Otherwise delete the sentence below:1} {**The patient is on the cardiac monitor to evaluate for evidence of arrhythmia and/or significant heart rate changes.**} {Remember to include, when applicable, any/all of the following data: independent review of imaging independent review of labs (comment specifically on pertinent positives and negatives) review of  specific prior hospitalizations, PCP/specialist notes, etc. discuss meds given and prescribed document any discussion with consultants (including hospitalists) any clinical decision tools you used and why (PECARN, NEXUS, etc.) did you consider admitting the patient? document social determinants of health affecting patient's care (homelessness, inability to follow up in a timely fashion, etc) document any pre-existing conditions increasing risk on current visit (e.g. diabetes and HTN increasing danger of high-risk chest pain/ACS) describes what meds you gave (especially parenteral) and why any other interventions?:1}     FINAL CLINICAL IMPRESSION(S) / ED DIAGNOSES   Final diagnoses:  None     Rx / DC Orders   ED Discharge Orders     None        Note:  This document was prepared using Dragon voice recognition software and may include unintentional dictation errors.

## 2021-12-14 NOTE — ED Provider Notes (Signed)
[facility] Provider Note    Event Date/Time   First MD Initiated Contact with Patient 12/14/21 2159     (approximate)   History   Cough   HPI {Remember to add pertinent medical, surgical, social, and/or OB history to HPI:1} Carl Coffey is a 88 y.o. male  ***       Physical Exam   Triage Vital Signs: ED Triage Vitals  Enc Vitals Group     BP 12/14/21 2055 138/67     Pulse Rate 12/14/21 2055 79     Resp 12/14/21 2055 (!) 22     Temp 12/14/21 2055 99.5 F (37.5 C)     Temp Source 12/14/21 2055 Oral     SpO2 12/14/21 2055 (!) 89 %     Weight 12/14/21 2056 190 lb (86.2 kg)     Height 12/14/21 2056 '5\' 10"'$  (1.778 m)     Head Circumference --      Peak Flow --      Pain Score 12/14/21 2056 0     Pain Loc --      Pain Edu? --      Excl. in Media? --     Most recent vital signs: Vitals:   12/14/21 2055 12/14/21 2059  BP: 138/67   Pulse: 79   Resp: (!) 22   Temp: 99.5 F (37.5 C)   SpO2: (!) 89% 92%    {Only need to document appropriate and relevant physical exam:1} General: Awake, no distress. *** CV:  Good peripheral perfusion. *** Resp:  Normal effort. *** Abd:  No distention. *** Other:  ***   ED Results / Procedures / Treatments   Labs (all labs ordered are listed, but only abnormal results are displayed) Labs Reviewed  COMPREHENSIVE METABOLIC PANEL - Abnormal; Notable for the following components:      Result Value   CO2 21 (*)    Glucose, Bld 150 (*)    BUN 28 (*)    Alkaline Phosphatase 34 (*)    Total Bilirubin 2.0 (*)    All other components within normal limits  CBC WITH DIFFERENTIAL/PLATELET - Abnormal; Notable for the following components:   WBC 13.3 (*)    Neutro Abs 11.4 (*)    Lymphs Abs 0.5 (*)    Monocytes Absolute 1.3 (*)    All other components within normal limits  CULTURE, BLOOD (ROUTINE X 2)  CULTURE, BLOOD (ROUTINE X 2)  SARS CORONAVIRUS 2 BY RT PCR  LACTIC ACID, PLASMA  LACTIC ACID, PLASMA   URINALYSIS, ROUTINE W REFLEX MICROSCOPIC     EKG  I, Nance Pear, attending physician, personally viewed and interpreted this EKG  EKG Time: 2107 Rate: 74 Rhythm: atrial fibrillation Axis: left axis deviation Intervals: qtc 479 QRS: RBBB, LAFB ST changes: no st elevation Impression: abnormal ekg   RADIOLOGY I independently interpreted and visualized the CXR. My interpretation: No pneumonia Radiology interpretation:  IMPRESSION: No acute abnormality noted.     PROCEDURES:  Critical Care performed: {CriticalCareYesNo:19197::"Yes, see critical care procedure note(s)","No"}  Procedures   MEDICATIONS ORDERED IN ED: Medications - No data to display   IMPRESSION / MDM / [place] / ED COURSE  I reviewed the triage vital signs and the nursing notes.                              Differential diagnosis includes, but is not limited to, ***  Patient's presentation is most consistent with {EM COPA:27473}  {If the patient is on the monitor, remove the brackets and asterisks on the sentence below and remember to document it as a Procedure as well. Otherwise delete the sentence below:1} {**The patient is on the cardiac monitor to evaluate for evidence of arrhythmia and/or significant heart rate changes.**} {Remember to include, when applicable, any/all of the following data: independent review of imaging independent review of labs (comment specifically on pertinent positives and negatives) review of specific prior hospitalizations, PCP/specialist notes, etc. discuss meds given and prescribed document any discussion with consultants (including hospitalists) any clinical decision tools you used and why (PECARN, NEXUS, etc.) did you consider admitting the patient? document social determinants of health affecting patient's care (homelessness, inability to follow up in a timely fashion, etc) document any pre-existing conditions increasing risk on current visit  (e.g. diabetes and HTN increasing danger of high-risk chest pain/ACS) describes what meds you gave (especially parenteral) and why any other interventions?:1}     FINAL CLINICAL IMPRESSION(S) / ED DIAGNOSES   Final diagnoses:  None     Rx / DC Orders   ED Discharge Orders     None        Note:  This document was prepared using Dragon voice recognition software and may include unintentional dictation errors.

## 2021-12-15 DIAGNOSIS — I10 Essential (primary) hypertension: Secondary | ICD-10-CM

## 2021-12-15 DIAGNOSIS — J9601 Acute respiratory failure with hypoxia: Secondary | ICD-10-CM

## 2021-12-15 DIAGNOSIS — J209 Acute bronchitis, unspecified: Secondary | ICD-10-CM | POA: Diagnosis not present

## 2021-12-15 DIAGNOSIS — J069 Acute upper respiratory infection, unspecified: Secondary | ICD-10-CM | POA: Diagnosis not present

## 2021-12-15 DIAGNOSIS — I482 Chronic atrial fibrillation, unspecified: Secondary | ICD-10-CM

## 2021-12-15 LAB — RESPIRATORY PANEL BY PCR

## 2021-12-15 LAB — PROTIME-INR
INR: 1.9 — ABNORMAL HIGH (ref 0.8–1.2)
Prothrombin Time: 21.4 seconds — ABNORMAL HIGH (ref 11.4–15.2)

## 2021-12-15 MED ORDER — WARFARIN SODIUM 2.5 MG PO TABS: 2.5000 mg | ORAL_TABLET | Freq: Every day | ORAL | Status: DC

## 2021-12-15 MED ORDER — ONDANSETRON HCL 4 MG/2ML IJ SOLN: 4.0000 mg | Freq: Four times a day (QID) | INTRAMUSCULAR | Status: DC | PRN

## 2021-12-15 MED ORDER — ALBUTEROL SULFATE HFA 108 (90 BASE) MCG/ACT IN AERS: 2.0000 | INHALATION_SPRAY | Freq: Four times a day (QID) | RESPIRATORY_TRACT | 2 refills | Status: DC | PRN

## 2021-12-15 MED ORDER — IPRATROPIUM-ALBUTEROL 0.5-2.5 (3) MG/3ML IN SOLN: 3.0000 mL | Freq: Two times a day (BID) | RESPIRATORY_TRACT | Status: DC

## 2021-12-15 MED ORDER — WARFARIN SODIUM 2.5 MG PO TABS
1.2500 mg | ORAL_TABLET | ORAL | Status: AC
  Administered 2021-12-15: 1.25 mg via ORAL
  Filled 2021-12-15: qty 0.5
  Filled 2021-12-15: qty 1

## 2021-12-15 MED ORDER — IPRATROPIUM-ALBUTEROL 0.5-2.5 (3) MG/3ML IN SOLN
3.0000 mL | Freq: Four times a day (QID) | RESPIRATORY_TRACT | Status: DC
  Administered 2021-12-15: 3 mL via RESPIRATORY_TRACT
  Filled 2021-12-15: qty 3

## 2021-12-15 MED ORDER — ACETAMINOPHEN 650 MG RE SUPP: 650.0000 mg | Freq: Four times a day (QID) | RECTAL | Status: DC | PRN

## 2021-12-15 MED ORDER — ONDANSETRON HCL 4 MG PO TABS: 4.0000 mg | ORAL_TABLET | Freq: Four times a day (QID) | ORAL | Status: DC | PRN

## 2021-12-15 MED ORDER — SIMVASTATIN 20 MG PO TABS: 10.0000 mg | ORAL_TABLET | Freq: Every day | ORAL | Status: DC

## 2021-12-15 MED ORDER — FINASTERIDE 5 MG PO TABS
5.0000 mg | ORAL_TABLET | Freq: Every day | ORAL | Status: DC
  Administered 2021-12-15: 5 mg via ORAL
  Filled 2021-12-15: qty 1

## 2021-12-15 MED ORDER — ACETAMINOPHEN 325 MG PO TABS: 650.0000 mg | ORAL_TABLET | Freq: Four times a day (QID) | ORAL | Status: DC | PRN

## 2021-12-15 MED ORDER — WARFARIN - PHARMACIST DOSING INPATIENT: Freq: Every day | Status: DC

## 2021-12-15 MED ORDER — INFLUENZA VAC A&B SA ADJ QUAD 0.5 ML IM PRSY: 0.5000 mL | PREFILLED_SYRINGE | INTRAMUSCULAR | Status: DC

## 2021-12-15 MED ORDER — ASPIRIN 81 MG PO TBEC
81.0000 mg | DELAYED_RELEASE_TABLET | Freq: Every day | ORAL | Status: DC
  Administered 2021-12-15: 81 mg via ORAL
  Filled 2021-12-15: qty 1

## 2021-12-15 MED ORDER — ALBUTEROL SULFATE (2.5 MG/3ML) 0.083% IN NEBU: 2.5000 mg | INHALATION_SOLUTION | RESPIRATORY_TRACT | Status: DC | PRN

## 2021-12-15 MED ORDER — IPRATROPIUM-ALBUTEROL 0.5-2.5 (3) MG/3ML IN SOLN
3.0000 mL | Freq: Once | RESPIRATORY_TRACT | Status: AC
  Administered 2021-12-15: 3 mL via RESPIRATORY_TRACT
  Filled 2021-12-15: qty 3

## 2021-12-15 MED ORDER — LEVOTHYROXINE SODIUM 50 MCG PO TABS
75.0000 ug | ORAL_TABLET | Freq: Every day | ORAL | Status: DC
  Administered 2021-12-15: 75 ug via ORAL
  Filled 2021-12-15: qty 1

## 2021-12-15 MED ORDER — WARFARIN SODIUM 2.5 MG PO TABS
2.5000 mg | ORAL_TABLET | Freq: Once | ORAL | Status: DC
  Filled 2021-12-15: qty 1

## 2021-12-15 MED ORDER — METHYLPREDNISOLONE SODIUM SUCC 125 MG IJ SOLR
125.0000 mg | Freq: Once | INTRAMUSCULAR | Status: AC
  Administered 2021-12-15: 125 mg via INTRAVENOUS
  Filled 2021-12-15: qty 2

## 2021-12-15 MED ORDER — GABAPENTIN 100 MG PO CAPS
100.0000 mg | ORAL_CAPSULE | Freq: Three times a day (TID) | ORAL | Status: DC
  Administered 2021-12-15: 100 mg via ORAL
  Filled 2021-12-15: qty 1

## 2021-12-15 NOTE — Assessment & Plan Note (Signed)
Continue levothyroxine 

## 2021-12-15 NOTE — Assessment & Plan Note (Addendum)
Acute respiratory failure with hypoxia Patient with cough, shortness of breath and wheezing with O2 sat 96% on room air DuoNebs as needed with albuterol every 6 with albuterol as needed We will hold off on steroids as acute infection is suspected Follow respiratory viral panel.  COVID was negative Supplemental oxygen and wean as tolerated

## 2021-12-15 NOTE — Assessment & Plan Note (Signed)
Pharmacy consult for Coumadin management for therapeutic INR 2-3

## 2021-12-15 NOTE — Assessment & Plan Note (Signed)
Continue metoprolol, simvastatin and aspirin

## 2021-12-15 NOTE — Care Management CC44 (Signed)
Condition Code 44 Documentation Completed  Patient Details  Name: HASSAAN CRITE MRN: [id number] Date of Birth: 1921-04-21   Condition Code 44 given:  Yes Patient signature on Condition Code 44 notice:  Yes Documentation of 2 MD's agreement:  Yes Code 44 added to claim:  Yes    Candie Chroman, LCSW 12/15/2021, 12:37 PM

## 2021-12-15 NOTE — ED Notes (Signed)
Per Myer Haff, rn can not accept the pt to the floor without further orders and an h and p. Spoke with mary, charge rn who states need further orders such as diet order to accept pt to floor.

## 2021-12-15 NOTE — Progress Notes (Addendum)
ANTICOAGULATION CONSULT NOTE - Initial Consult  Pharmacy Consult for Warfarin  Indication: atrial fibrillation  Allergies  Allergen Reactions   Flexeril [Cyclobenzaprine] Other (See Comments)    Blacks out    Patient Measurements: Height: '5\' 10"'$  (177.8 cm) Weight: 85.5 kg (188 lb 9.6 oz) IBW/kg (Calculated) : 73 Heparin Dosing Weight:   Vital Signs: Temp: 97.7 F (36.5 C) (10/02 0215) Temp Source: Oral (10/02 0215) BP: 118/53 (10/02 0215) Pulse Rate: 85 (10/02 0215)  Labs: Recent Labs    12/14/21 2108 12/15/21 0322  HGB 14.4  --   HCT 43.5  --   PLT 173  --   LABPROT  --  21.4*  INR  --  1.9*  CREATININE 0.99  --     Estimated Creatinine Clearance: 43 mL/min (by C-G formula based on SCr of 0.99 mg/dL).   Medical History: Past Medical History:  Diagnosis Date   A-fib ([place])    Basal cell carcinoma 10/13/2017   right ant neck   Basal cell carcinoma 10/09/2020   right dorsum hand - EDC   Basal cell carcinoma 10/08/2021   Right mid dorsum nose. Nodular, ulcerated. EDC   Coronary artery disease    Dysplastic nevus 02/10/2016   right og midline epigastric   Hypertension    Hypothyroidism    Myocardial infarction [facility])    Squamous cell carcinoma of skin 07/26/2007   right post earlobe (in situ)   Thyroid disease     Medications:  Facility-Administered Medications Prior to Admission  Medication Dose Route Frequency Provider Last Rate Last Admin   gemcitabine (GEMZAR) 2,000 mg in sodium chloride irrigation 0.9 % chemo infusion  2,000 mg Irrigation Once Hollice Espy, MD       gemcitabine (GEMZAR) 2,000 mg in sodium chloride irrigation 0.9 % chemo infusion  2,000 mg Irrigation Once Hollice Espy, MD       Medications Prior to Admission  Medication Sig Dispense Refill Last Dose   amLODipine (NORVASC) 5 MG tablet Take 5 mg by mouth daily.   12/14/2021 at 1900   aspirin EC 81 MG tablet Take 81 mg by mouth daily.   12/14/2021 at 0800   doxycycline  (VIBRAMYCIN) 100 MG capsule Take 100 mg by mouth 2 (two) times daily.   12/14/2021 at 1900   finasteride (PROSCAR) 5 MG tablet Take 5 mg by mouth daily.    12/14/2021 at 1900   furosemide (LASIX) 20 MG tablet Take 1 tablet (20 mg total) by mouth daily. (Patient taking differently: Take 20 mg by mouth every other day.) 30 tablet 0 12/13/2021 at 0800   gabapentin (NEURONTIN) 100 MG capsule Take 100 mg by mouth 3 (three) times daily.   12/14/2021 at 1900   ketoconazole (NIZORAL) 2 % shampoo Apply 1 application topically 3 (three) times a week. shampoo scalp 3x/wk, let sit 5 minutes before rinsing out 120 mL 6 Past Week   levothyroxine (SYNTHROID, LEVOTHROID) 75 MCG tablet Take 75 mcg by mouth daily before breakfast.    12/14/2021 at 1900   metoprolol succinate (TOPROL-XL) 25 MG 24 hr tablet Take 25 mg by mouth daily.    12/14/2021 at 1900   simvastatin (ZOCOR) 10 MG tablet Take 10 mg by mouth at bedtime.   12/14/2021 at 1900   warfarin (COUMADIN) 2.5 MG tablet Take 2.5 mg by mouth daily.    12/14/2021 at 1900    Assessment: Pharmacy consulted to dose warfarin for AFib in this 88 year old male admitted with acute bronchitis.  Pt was on warfarin 2.5 mg PO daily PTA,  last dose on 10/1 @ 1900. CHADS-VASc = 4   10/2 :  INR @ 0322 = 1.9 , subtherapeutic   Goal of Therapy:  INR 2-3   Plan:  Will order additional warfarin 1.25 mg PO X 1 for now. Resume warfarin 2.5 mg PO daily on 10/2 @ 1800.  Will check INR daily.   Carl Coffey,Carl Coffey 12/15/2021,4:19 AM  10/2: Continue anticoagulation as above. Change warfarin administration time to institution 1600. Repeat INR in AM and will adjust dose accordantly.  Carl Coffey PharmD, BCPS 12/15/2021 8:25 AM

## 2021-12-15 NOTE — Care Management Obs Status (Signed)
[place] NOTIFICATION   Patient Details  Name: Carl Coffey MRN: [id number] Date of Birth: 22-Apr-1921   Medicare Observation Status Notification Given:  Yes    Candie Chroman, LCSW 12/15/2021, 12:37 PM

## 2021-12-15 NOTE — TOC CM/SW Note (Signed)
Patient has orders to discharge home today. Chart reviewed. No TOC needs identified. Daughter-in-law is at bedside and will transport home. CSW signing off.  Dayton Scrape, [place]

## 2021-12-15 NOTE — Discharge Instructions (Signed)
Please seek medical attention for any high fevers, chest pain, shortness of breath, change in behavior, persistent vomiting, bloody stool or any other new or concerning symptoms.  

## 2021-12-15 NOTE — ED Notes (Addendum)
Pt with ambulatory pox on ra of 86%. Dr. Archie Balboa notified, pt states he feels "fine".

## 2021-12-15 NOTE — Assessment & Plan Note (Signed)
Continue amlodipine and metoprolol 

## 2021-12-15 NOTE — ED Notes (Signed)
Pt assisted back to bed, placed back on oxygen.

## 2021-12-15 NOTE — H&P (Signed)
History and Physical    Patient: Carl Coffey HMC:[id number] DOB: October 18, 1921 DOA: 12/14/2021 DOS: the patient was seen and examined on 12/15/2021 PCP: Kirk Ruths, MD  Patient coming from: Home  Chief Complaint:  Chief Complaint  Patient presents with   Cough    HPI: Carl Coffey is a 88 y.o. male with medical history significant for A-fib on Coumadin, hypertension and hypothyroidism who presents to the ED with a 2-day history of a cough, congestion and runny nose.  Carl Coffey has mild shortness of breath.  Family members also sick and they all tested negative for COVID.  Carl Coffey denies fever, chills or chest pain.  Denies vomiting or diarrhea. ED course and data review: Tachypneic to 22-24 with O2 sat 89% on room air.  Tmax 99.5 with soft blood pressure of 103/57.  Labs with WBC 13.3, lactic acid 1.4, COVID-negative.  Total bilirubin 2.0.  EKG, personally viewed and interpreted showing A-fib at 74 with nonspecific ST-T wave changes.  CTA chest with no evidence of PE and no acute cardiopulmonary disease. Patient treated with DuoNeb with symptomatic improvement and was given a dose of Solu-Medrol.  On attempted discharge patient Carl Coffey desaturated to 86% with ambulation.  Hospitalist consulted for admission.   Review of Systems: As mentioned in the history of present illness. All other systems reviewed and are negative.  Past Medical History:  Diagnosis Date   A-fib ([place])    Basal cell carcinoma 10/13/2017   right ant neck   Basal cell carcinoma 10/09/2020   right dorsum hand - EDC   Basal cell carcinoma 10/08/2021   Right mid dorsum nose. Nodular, ulcerated. EDC   Coronary artery disease    Dysplastic nevus 02/10/2016   right og midline epigastric   Hypertension    Hypothyroidism    Myocardial infarction ([place])    Squamous cell carcinoma of skin 07/26/2007   right post earlobe (in situ)   Thyroid disease    Past Surgical History:  Procedure Laterality Date   [place]    CABG x 3   CATARACT EXTRACTION, BILATERAL     CORONARY ARTERY BYPASS GRAFT  1995   triple   CYSTOSCOPY W/ RETROGRADES Bilateral 02/20/2019   Procedure: CYSTOSCOPY WITH RETROGRADE PYELOGRAM;  Surgeon: Hollice Espy, MD;  Location: [facility] ORS;  Service: Urology;  Laterality: Bilateral;   HERNIA REPAIR Bilateral    MEATOTOMY N/A 02/20/2019   Procedure: MEATOTOMY ADULT;  Surgeon: Hollice Espy, MD;  Location: [facility] ORS;  Service: Urology;  Laterality: N/A;   TRANSURETHRAL RESECTION OF BLADDER TUMOR WITH MITOMYCIN-C N/A 02/20/2019   Procedure: TRANSURETHRAL RESECTION OF BLADDER TUMOR WITH Gemcitabine;  Surgeon: Hollice Espy, MD;  Location: [facility] ORS;  Service: Urology;  Laterality: N/A;   Social History:  reports that Carl Coffey has never smoked. Carl Coffey has never used smokeless tobacco. Carl Coffey reports that Carl Coffey does not drink alcohol and does not use drugs.  Allergies  Allergen Reactions   Flexeril [Cyclobenzaprine] Other (See Comments)    Blacks out    History reviewed. No pertinent family history.  Prior to Admission medications   Medication Sig Start Date End Date Taking? Authorizing Provider  albuterol (VENTOLIN HFA) 108 (90 Base) MCG/ACT inhaler Inhale 2 puffs into the lungs every 6 (six) hours as needed for wheezing or shortness of breath. 12/15/21  Yes Nance Pear, MD  amLODipine (NORVASC) 5 MG tablet Take 5 mg by mouth daily. 11/20/21  Yes [provider]  aspirin EC 81 MG tablet Take  81 mg by mouth daily.   Yes [provider]  doxycycline (VIBRAMYCIN) 100 MG capsule Take 100 mg by mouth 2 (two) times daily. 12/13/21 12/20/21 Yes [provider]  finasteride (PROSCAR) 5 MG tablet Take 5 mg by mouth daily.  02/19/17  Yes [provider]  furosemide (LASIX) 20 MG tablet Take 1 tablet (20 mg total) by mouth daily. Patient taking differently: Take 20 mg by mouth every other day. 02/20/17 12/15/21 Yes Paduchowski, Lennette Bihari, MD  gabapentin (NEURONTIN) 100 MG capsule Take 100  mg by mouth 3 (three) times daily.   Yes [provider]  ketoconazole (NIZORAL) 2 % shampoo Apply 1 application topically 3 (three) times a week. shampoo scalp 3x/wk, let sit 5 minutes before rinsing out 10/09/20  Yes Ralene Bathe, MD  levothyroxine (SYNTHROID, LEVOTHROID) 75 MCG tablet Take 75 mcg by mouth daily before breakfast.  12/04/16  Yes [provider]  metoprolol succinate (TOPROL-XL) 25 MG 24 hr tablet Take 25 mg by mouth daily.  01/08/17  Yes [provider]  simvastatin (ZOCOR) 10 MG tablet Take 10 mg by mouth at bedtime. 11/26/21  Yes [provider]  warfarin (COUMADIN) 2.5 MG tablet Take 2.5 mg by mouth daily.  02/12/17  Yes [provider]    Physical Exam: Vitals:   12/14/21 2330 12/15/21 0009 12/15/21 0030 12/15/21 0130  BP: 131/62  118/64 (!) 103/57  Pulse: 69 75 64 (!) 56  Resp: (!) 22 (!) 24 (!) 22 (!) 22  Temp:      TempSrc:      SpO2: 95% 90% 94% 94%  Weight:      Height:       Physical Exam Vitals and nursing note reviewed.  Constitutional:      General: Carl Coffey is not in acute distress. HENT:     Head: Normocephalic and atraumatic.  Cardiovascular:     Rate and Rhythm: Normal rate and regular rhythm.     Heart sounds: Normal heart sounds.  Pulmonary:     Effort: Pulmonary effort is normal. Tachypnea present.     Breath sounds: Normal breath sounds.  Abdominal:     Palpations: Abdomen is soft.     Tenderness: There is no abdominal tenderness.  Neurological:     Mental Status: Mental status is at baseline.     Labs on Admission: I have personally reviewed following labs and imaging studies  CBC: Recent Labs  Lab 12/14/21 2108  WBC 13.3*  NEUTROABS 11.4*  HGB 14.4  HCT 43.5  MCV 96.2  PLT 213   Basic Metabolic Panel: Recent Labs  Lab 12/14/21 2108  NA 137  K 4.0  CL 104  CO2 21*  GLUCOSE 150*  BUN 28*  CREATININE 0.99  CALCIUM 9.0   GFR: Estimated Creatinine Clearance: 43 mL/min (by  C-G formula based on SCr of 0.99 mg/dL). Liver Function Tests: Recent Labs  Lab 12/14/21 2108  AST 27  ALT 21  ALKPHOS 34*  BILITOT 2.0*  PROT 6.9  ALBUMIN 4.2   No results for input(s): "LIPASE", "AMYLASE" in the last 168 hours. No results for input(s): "AMMONIA" in the last 168 hours. Coagulation Profile: No results for input(s): "INR", "PROTIME" in the last 168 hours. Cardiac Enzymes: No results for input(s): "CKTOTAL", "CKMB", "CKMBINDEX", "TROPONINI" in the last 168 hours. BNP (last 3 results) No results for input(s): "PROBNP" in the last 8760 hours. HbA1C: No results for input(s): "HGBA1C" in the last 72 hours. CBG: No results  for input(s): "GLUCAP" in the last 168 hours. Lipid Profile: No results for input(s): "CHOL", "HDL", "LDLCALC", "TRIG", "CHOLHDL", "LDLDIRECT" in the last 72 hours. Thyroid Function Tests: No results for input(s): "TSH", "T4TOTAL", "FREET4", "T3FREE", "THYROIDAB" in the last 72 hours. Anemia Panel: No results for input(s): "VITAMINB12", "FOLATE", "FERRITIN", "TIBC", "IRON", "RETICCTPCT" in the last 72 hours. Urine analysis:    Component Value Date/Time   COLORURINE STRAW (A) 02/26/2019 2100   APPEARANCEUR Clear 06/24/2021 1027   LABSPEC 1.005 02/26/2019 2100   PHURINE 7.0 02/26/2019 2100   GLUCOSEU Negative 06/24/2021 1027   HGBUR LARGE (A) 02/26/2019 2100   BILIRUBINUR Negative 06/24/2021 [place] 02/26/2019 2100   PROTEINUR Negative 06/24/2021 [place] NEGATIVE 02/26/2019 2100   NITRITE Negative 06/24/2021 1027   NITRITE NEGATIVE 02/26/2019 2100   LEUKOCYTESUR Trace (A) 06/24/2021 1027   LEUKOCYTESUR NEGATIVE 02/26/2019 2100    Radiological Exams on Admission: CT Angio Chest PE W and/or Wo Contrast  Result Date: 12/14/2021 CLINICAL DATA:  Shortness of breath, cough, proxy of EXAM: CT ANGIOGRAPHY CHEST WITH CONTRAST TECHNIQUE: Multidetector CT imaging of the chest was performed using the standard protocol  during bolus administration of intravenous contrast. Multiplanar CT image reconstructions and MIPs were obtained to evaluate the vascular anatomy. RADIATION DOSE REDUCTION: This exam was performed according to the departmental dose-optimization program which includes automated exposure control, adjustment of the mA and/or kV according to patient size and/or use of iterative reconstruction technique. CONTRAST:  20m OMNIPAQUE IOHEXOL 350 MG/ML SOLN COMPARISON:  12/14/2021 FINDINGS: Cardiovascular: Satisfactory opacification of the bilateral pulmonary arteries to the segmental level. No evidence of pulmonary embolism. Study is not tailored for evaluation of the thoracic aorta. No evidence of thoracic aortic aneurysm. Atherosclerotic calcifications of the arch. Mild cardiomegaly. No pericardial effusion. Coronary atherosclerosis of the LAD and right coronary artery. Postsurgical changes related to prior CABG. Mediastinum/Nodes: No suspicious mediastinal lymphadenopathy. Visualized thyroid is unremarkable. Lungs/Pleura: Lungs are essentially clear. No suspicious pulmonary nodules. Mild right basilar atelectasis. No pleural effusion or pneumothorax. Upper Abdomen: Visualized upper abdomen is notable for vascular calcifications and a dominant 4.9 cm left upper pole renal cyst (series 4/image 164), benign (Bosniak I). Musculoskeletal: Degenerative changes of the visualized thoracolumbar spine. Median sternotomy. Review of the MIP images confirms the above findings. IMPRESSION: No evidence of pulmonary embolism. No evidence of acute cardiopulmonary disease. Aortic Atherosclerosis (ICD10-I70.0). Electronically Signed   By: SJulian HyM.D.   On: 12/14/2021 22:55   DG Chest 2 View  Result Date: 12/14/2021 CLINICAL DATA:  Possible sepsis EXAM: CHEST - 2 VIEW COMPARISON:  02/20/2017 FINDINGS: Cardiac shadow is enlarged. Postsurgical changes are again seen. Aortic calcifications are again noted. Right hemidiaphragm is  again mildly elevated but stable. Lungs are well aerated without focal infiltrate or sizable effusion. No bony abnormality is noted. IMPRESSION: No acute abnormality noted. Electronically Signed   By: MInez CatalinaM.D.   On: 12/14/2021 21:45     Data Reviewed: Relevant notes from primary care and specialist visits, past discharge summaries as available in EHR, including Care Everywhere. Prior diagnostic testing as pertinent to current admission diagnoses Updated medications and problem lists for reconciliation ED course, including vitals, labs, imaging, treatment and response to treatment Triage notes, nursing and pharmacy notes and ED provider's notes Notable results as noted in HPI   Assessment and Plan: * Acute bronchitis Acute respiratory failure with hypoxia Patient with cough, shortness of breath and wheezing with O2 sat 96% on  room air DuoNebs as needed with albuterol every 6 with albuterol as needed We will hold off on steroids as acute infection is suspected Follow respiratory viral panel.  COVID was negative Supplemental oxygen and wean as tolerated  Hypothyroidism, unspecified Continue levothyroxine  Hypertension, benign Continue amlodipine and metoprolol  Coronary atherosclerosis Continue metoprolol, simvastatin and aspirin  Chronic atrial fibrillation [facility]) Pharmacy consult for Coumadin management for therapeutic INR 2-3        DVT prophylaxis: Coumadin  Consults: none  Advance Care Planning:   Code Status: Full Code   Family Communication: none  Disposition Plan: Back to previous home environment  Severity of Illness: The appropriate patient status for this patient is INPATIENT. Inpatient status is judged to be reasonable and necessary in order to provide the required intensity of service to ensure the patient's safety. The patient's presenting symptoms, physical exam findings, and initial radiographic and laboratory data in the context of their chronic  comorbidities is felt to place them at high risk for further clinical deterioration. Furthermore, it is not anticipated that the patient will be medically stable for discharge from the hospital within 2 midnights of admission.   * I certify that at the point of admission it is my clinical judgment that the patient will require inpatient hospital care spanning beyond 2 midnights from the point of admission due to high intensity of service, high risk for further deterioration and high frequency of surveillance required.*  Author: Athena Masse, MD 12/15/2021 1:54 AM  For on call review [email or web address].

## 2021-12-16 NOTE — Discharge Summary (Signed)
Physician Discharge Summary   Patient: Carl Coffey MRN: [id number] DOB: 1921-04-26  Admit date:     12/14/2021  Discharge date: 12/15/2021  Discharge Physician: Max Sane   PCP: Kirk Ruths, MD   Recommendations at discharge:   Follow-up with outpatient providers as requested  Discharge Diagnoses: Principal Problem:   Acute bronchitis Active Problems:   Chronic atrial fibrillation (HCC)   Coronary atherosclerosis   Hypertension, benign   Hypothyroidism, unspecified   Acute respiratory failure with hypoxia ([place])   Viral URI  Hospital Course: Assessment and Plan: * Acute bronchitis Acute respiratory failure with hypoxia Respiratory panel positive for rhino/enterovirus.  Patient and family would like patient to be home as it is the 75th anniversary tomorrow.  Patient is feeling much better and is on room air  Hypothyroidism, unspecified Continue levothyroxine  Hypertension, benign Continue amlodipine and metoprolol  Coronary atherosclerosis Continue metoprolol, simvastatin and aspirin  Chronic atrial fibrillation ([place]) On Coumadin         Disposition: Home Diet recommendation:  Discharge Diet Orders (From admission, onward)     Start     Ordered   12/15/21 0000  Diet - low sodium heart healthy        12/15/21 1236           Cardiac diet DISCHARGE MEDICATION: Allergies as of 12/15/2021       Reactions   Flexeril [cyclobenzaprine] Other (See Comments)   Blacks out        Medication List     TAKE these medications    albuterol 108 (90 Base) MCG/ACT inhaler Commonly known as: VENTOLIN HFA Inhale 2 puffs into the lungs every 6 (six) hours as needed for wheezing or shortness of breath.   amLODipine 5 MG tablet Commonly known as: NORVASC Take 5 mg by mouth daily.   aspirin EC 81 MG tablet Take 81 mg by mouth daily.   doxycycline 100 MG capsule Commonly known as: VIBRAMYCIN Take 100 mg by mouth 2 (two) times daily.   finasteride  5 MG tablet Commonly known as: PROSCAR Take 5 mg by mouth daily.   furosemide 20 MG tablet Commonly known as: Lasix Take 1 tablet (20 mg total) by mouth daily. What changed: when to take this   gabapentin 100 MG capsule Commonly known as: NEURONTIN Take 100 mg by mouth 3 (three) times daily.   ketoconazole 2 % shampoo Commonly known as: NIZORAL Apply 1 application topically 3 (three) times a week. shampoo scalp 3x/wk, let sit 5 minutes before rinsing out   levothyroxine 75 MCG tablet Commonly known as: SYNTHROID Take 75 mcg by mouth daily before breakfast.   metoprolol succinate 25 MG 24 hr tablet Commonly known as: TOPROL-XL Take 25 mg by mouth daily.   simvastatin 10 MG tablet Commonly known as: ZOCOR Take 10 mg by mouth at bedtime.   warfarin 2.5 MG tablet Commonly known as: COUMADIN Take 2.5 mg by mouth daily.        Follow-up Information     Kirk Ruths, MD. Schedule an appointment as soon as possible for a visit in 3 day(s).   Specialty: Internal Medicine Why: [facility] Discharge F/UP Contact information: [place]  [id number] [phone]                Discharge Exam: Danley Danker Weights   12/14/21 2056 12/15/21 0215  Weight: 86.2 kg 48.71 kg   88 year old male lying in the bed  comfortably without any acute distress Lungs clear to auscultation bilaterally Cardiovascular regular rate and rhythm Abdomen soft, benign Neuro alert and awake, nonfocal  Condition at discharge: fair  The results of significant diagnostics from this hospitalization (including imaging, microbiology, ancillary and laboratory) are listed below for reference.   Imaging Studies: CT Angio Chest PE W and/or Wo Contrast  Result Date: 12/14/2021 CLINICAL DATA:  Shortness of breath, cough, proxy of EXAM: CT ANGIOGRAPHY CHEST WITH CONTRAST TECHNIQUE: Multidetector CT imaging of the chest was performed using the standard  protocol during bolus administration of intravenous contrast. Multiplanar CT image reconstructions and MIPs were obtained to evaluate the vascular anatomy. RADIATION DOSE REDUCTION: This exam was performed according to the departmental dose-optimization program which includes automated exposure control, adjustment of the mA and/or kV according to patient size and/or use of iterative reconstruction technique. CONTRAST:  44m OMNIPAQUE IOHEXOL 350 MG/ML SOLN COMPARISON:  12/14/2021 FINDINGS: Cardiovascular: Satisfactory opacification of the bilateral pulmonary arteries to the segmental level. No evidence of pulmonary embolism. Study is not tailored for evaluation of the thoracic aorta. No evidence of thoracic aortic aneurysm. Atherosclerotic calcifications of the arch. Mild cardiomegaly. No pericardial effusion. Coronary atherosclerosis of the LAD and right coronary artery. Postsurgical changes related to prior CABG. Mediastinum/Nodes: No suspicious mediastinal lymphadenopathy. Visualized thyroid is unremarkable. Lungs/Pleura: Lungs are essentially clear. No suspicious pulmonary nodules. Mild right basilar atelectasis. No pleural effusion or pneumothorax. Upper Abdomen: Visualized upper abdomen is notable for vascular calcifications and a dominant 4.9 cm left upper pole renal cyst (series 4/image 164), benign (Bosniak I). Musculoskeletal: Degenerative changes of the visualized thoracolumbar spine. Median sternotomy. Review of the MIP images confirms the above findings. IMPRESSION: No evidence of pulmonary embolism. No evidence of acute cardiopulmonary disease. Aortic Atherosclerosis (ICD10-I70.0). Electronically Signed   By: SJulian HyM.D.   On: 12/14/2021 22:55   DG Chest 2 View  Result Date: 12/14/2021 CLINICAL DATA:  Possible sepsis EXAM: CHEST - 2 VIEW COMPARISON:  02/20/2017 FINDINGS: Cardiac shadow is enlarged. Postsurgical changes are again seen. Aortic calcifications are again noted. Right  hemidiaphragm is again mildly elevated but stable. Lungs are well aerated without focal infiltrate or sizable effusion. No bony abnormality is noted. IMPRESSION: No acute abnormality noted. Electronically Signed   By: MInez CatalinaM.D.   On: 12/14/2021 21:45    Microbiology: Results for orders placed or performed during the hospital encounter of 12/14/21  Culture, blood (Routine x 2)     Status: None (Preliminary result)   Collection Time: 12/14/21  9:09 PM   Specimen: BLOOD RIGHT ARM  Result Value Ref Range Status   Specimen Description BLOOD RIGHT ARM  Final   Special Requests   Final    BOTTLES DRAWN AEROBIC AND ANAEROBIC Blood Culture results may not be optimal due to an excessive volume of blood received in culture bottles   Culture   Final    NO GROWTH 2 DAYS [facility]formed [facility] [address] [place] [address]   Report Status PENDING  Incomplete  SARS Coronavirus 2 by RT PCR (hospital order, performed [facility]) *cepheid single result test* Anterior Nasal Swab     Status: None   Collection Time: 12/14/21 10:00 PM   Specimen: Anterior Nasal Swab  Result Value Ref Range Status   SARS Coronavirus 2 by RT PCR NEGATIVE NEGATIVE Final    Comment: (NOTE) SARS-CoV-2 target nucleic acids are NOT DETECTED.  The SARS-CoV-2 RNA is generally detectable  in upper and lower respiratory specimens during the acute phase of infection. The lowest concentration of SARS-CoV-2 viral copies this assay can detect is 250 copies / mL. A negative result does not preclude SARS-CoV-2 infection and should not be used as the sole basis for treatment or other patient management decisions.  A negative result may occur with improper specimen collection / handling, submission of specimen other than nasopharyngeal swab, presence of viral mutation(s) within the areas targeted by this assay, and inadequate number of viral copies (<250 copies / mL). A negative result  must be combined with clinical observations, patient history, and epidemiological information.  Fact Sheet for Patients:   https://www.patel.info/  Fact Sheet for Healthcare Providers: https://hall.com/  This test is not yet approved or  cleared by the [place] FDA and has been authorized for detection and/or diagnosis of SARS-CoV-2 by FDA under an Emergency Use Authorization (EUA).  This EUA will remain in effect (meaning this test can be used) for the duration of the COVID-19 declaration under Section 564(b)(1) of the Act, 21 U.S.C. section 360bbb-3(b)(1), unless the authorization is terminated or revoked sooner.  Performed at [facility], [place], [place] [address]   Respiratory (~20 pathogens) panel by PCR     Status: Abnormal   Collection Time: 12/14/21 10:00 PM   Specimen: Nasopharyngeal Swab; Respiratory  Result Value Ref Range Status   Adenovirus NOT DETECTED NOT DETECTED Final   Coronavirus 229E NOT DETECTED NOT DETECTED Final    Comment: (NOTE) The Coronavirus on the Respiratory Panel, DOES NOT test for the novel  Coronavirus (2019 nCoV)    Coronavirus HKU1 NOT DETECTED NOT DETECTED Final   Coronavirus NL63 NOT DETECTED NOT DETECTED Final   Coronavirus OC43 NOT DETECTED NOT DETECTED Final   Metapneumovirus NOT DETECTED NOT DETECTED Final   Rhinovirus / Enterovirus DETECTED (A) NOT DETECTED Final   Influenza A NOT DETECTED NOT DETECTED Final   Influenza B NOT DETECTED NOT DETECTED Final   Parainfluenza Virus 1 NOT DETECTED NOT DETECTED Final   Parainfluenza Virus 2 NOT DETECTED NOT DETECTED Final   Parainfluenza Virus 3 NOT DETECTED NOT DETECTED Final   Parainfluenza Virus 4 NOT DETECTED NOT DETECTED Final   Respiratory Syncytial Virus NOT DETECTED NOT DETECTED Final   Bordetella pertussis NOT DETECTED NOT DETECTED Final   Bordetella Parapertussis NOT DETECTED NOT DETECTED Final    Chlamydophila pneumoniae NOT DETECTED NOT DETECTED Final   Mycoplasma pneumoniae NOT DETECTED NOT DETECTED Final    Comment: Performed at [facility], [place]. [address], [place], [place] [address]  Culture, blood (Routine x 2)     Status: None (Preliminary result)   Collection Time: 12/15/21  3:22 AM   Specimen: BLOOD  Result Value Ref Range Status   Specimen Description BLOOD LEFT ANTECUBITAL  Final   Special Requests   Final    BOTTLES DRAWN AEROBIC AND ANAEROBIC Blood Culture adequate volume   Culture   Final    NO GROWTH 1 DAY Performed at [facility], [address] [place], [place] [address]    Report Status PENDING  Incomplete    Labs: CBC: Recent Labs  Lab 12/14/21 2108  WBC 13.3*  NEUTROABS 11.4*  HGB 14.4  HCT 43.5  MCV 96.2  PLT 253   Basic Metabolic Panel: Recent Labs  Lab 12/14/21 2108  NA 137  K 4.0  CL 104  CO2 21*  GLUCOSE 150*  BUN 28*  CREATININE 0.99  CALCIUM 9.0   Liver Function Tests: Recent Labs  Lab 12/14/21 2108  AST 27  ALT 21  ALKPHOS 34*  BILITOT 2.0*  PROT 6.9  ALBUMIN 4.2   CBG: No results for input(s): "GLUCAP" in the last 168 hours.  Discharge time spent: greater than 30 minutes.  Signed: Max Sane, MD [facility] 12/16/2021

## 2021-12-18 DIAGNOSIS — I482 Chronic atrial fibrillation, unspecified: Secondary | ICD-10-CM | POA: Diagnosis not present

## 2021-12-18 DIAGNOSIS — E039 Hypothyroidism, unspecified: Secondary | ICD-10-CM | POA: Diagnosis not present

## 2021-12-18 DIAGNOSIS — I129 Hypertensive chronic kidney disease with stage 1 through stage 4 chronic kidney disease, or unspecified chronic kidney disease: Secondary | ICD-10-CM | POA: Diagnosis not present

## 2021-12-18 DIAGNOSIS — I2510 Atherosclerotic heart disease of native coronary artery without angina pectoris: Secondary | ICD-10-CM | POA: Diagnosis not present

## 2021-12-18 DIAGNOSIS — N183 Chronic kidney disease, stage 3 unspecified: Secondary | ICD-10-CM | POA: Diagnosis not present

## 2021-12-19 LAB — CULTURE, BLOOD (ROUTINE X 2): Culture: NO GROWTH

## 2021-12-20 LAB — CULTURE, BLOOD (ROUTINE X 2)
Culture: NO GROWTH
Special Requests: ADEQUATE

## 2021-12-26 DIAGNOSIS — J40 Bronchitis, not specified as acute or chronic: Secondary | ICD-10-CM | POA: Diagnosis not present

## 2021-12-26 DIAGNOSIS — Z7901 Long term (current) use of anticoagulants: Secondary | ICD-10-CM | POA: Diagnosis not present

## 2021-12-26 DIAGNOSIS — R0981 Nasal congestion: Secondary | ICD-10-CM | POA: Diagnosis not present

## 2021-12-26 DIAGNOSIS — I482 Chronic atrial fibrillation, unspecified: Secondary | ICD-10-CM | POA: Diagnosis not present

## 2021-12-30 ENCOUNTER — Ambulatory Visit: Payer: PPO | Admitting: Urology

## 2021-12-30 DIAGNOSIS — D494 Neoplasm of unspecified behavior of bladder: Secondary | ICD-10-CM | POA: Diagnosis not present

## 2021-12-30 DIAGNOSIS — N3289 Other specified disorders of bladder: Secondary | ICD-10-CM | POA: Diagnosis not present

## 2021-12-30 DIAGNOSIS — Z8551 Personal history of malignant neoplasm of bladder: Secondary | ICD-10-CM

## 2021-12-30 LAB — URINALYSIS, COMPLETE
Bilirubin, UA: NEGATIVE
Glucose, UA: NEGATIVE
Ketones, UA: NEGATIVE
Leukocytes,UA: NEGATIVE
Nitrite, UA: NEGATIVE
Specific Gravity, UA: 1.02 (ref 1.005–1.030)
Urobilinogen, Ur: 4 mg/dL — ABNORMAL HIGH (ref 0.2–1.0)
pH, UA: 7 (ref 5.0–7.5)

## 2021-12-30 LAB — MICROSCOPIC EXAMINATION

## 2021-12-30 NOTE — Progress Notes (Signed)
   12/30/21 CC:  Chief Complaint  Patient presents with   Cysto    HPI: Carl Coffey is a 88 y.o. male with a personal history of bladder cancer, who presents today for a surveillance cystoscopy.   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  Received intravesical gemcitabine post op.     He underwent fulguration of a very small possible early recurrence which was a 2 mm area adjacent to the left bladder neck at the inferior pole of a stellate scar on the lateral wall on 7/22.   Elected to abstain from BCG.   He is doing well and has no urinary complaints today.    He does have microscopic blood in his urine today.  NED. A&Ox3.   No respiratory distress   Abd soft, NT, ND Normal phallus with bilateral descended testicles  Cystoscopy Procedure Note  Patient identification was confirmed, informed consent was obtained, and patient was prepped using Betadine solution.  Lidocaine jelly was administered per urethral meatus.     Pre-Procedure: - Inspection reveals a normal caliber ureteral meatus.  Procedure: The flexible cystoscope was introduced without difficulty - No urethral strictures/lesions are present. - Enlarged prostate  prostate trilobar coaptation and a small papillary tumor on the right prostatic fossa near the apex - Elevated bladder neck with tumor at the anterior bladder neck - Bilateral ureteral orifices identified - Bladder mucosa stellate scar on left lateral bladder wall.  Just adjacent to this, there is a fine carpeting of tumor, approximately no greater than 1 cm in diameter. - No bladder stones -Mild trabeculation  Retroflexion shows with slight intravesical protrusion but no discrete median lobe.     Post-Procedure: - Patient tolerated the procedure well   Assessment/ Plan:  1. History of bladder cancer/multifocal bladder cancer recurrence -The areas of papillary tumor concerning for superficial recurrence -Given his age  and comorbidities, we discussed the options of observation versus TURBT with intravesical gemcitabine versus in office fulguration.  Risk and benefits of each were discussed in detail.  Specifically, risk of anesthetic was discussed as well as lack of pathology with an office fulguration and the inability to give post procedural intravesical gemcitabine. -Based on the discussion today, they would like to try in office fulguration.  We will follow this up in about 6 weeks for repeat cystoscopy to assess for efficacy.  If he has recurrence or persistence of his tumor, he may opt for procedure in the operating room.  Return for office fulgeration fo bladder tumor - next availible.    Hollice Espy, MD

## 2022-01-07 DIAGNOSIS — I482 Chronic atrial fibrillation, unspecified: Secondary | ICD-10-CM | POA: Diagnosis not present

## 2022-01-07 DIAGNOSIS — Z7901 Long term (current) use of anticoagulants: Secondary | ICD-10-CM | POA: Diagnosis not present

## 2022-01-08 ENCOUNTER — Ambulatory Visit: Payer: PPO | Admitting: Urology

## 2022-01-08 ENCOUNTER — Encounter: Payer: PPO | Admitting: Urology

## 2022-01-08 DIAGNOSIS — D494 Neoplasm of unspecified behavior of bladder: Secondary | ICD-10-CM | POA: Diagnosis not present

## 2022-01-08 DIAGNOSIS — Z8551 Personal history of malignant neoplasm of bladder: Secondary | ICD-10-CM | POA: Diagnosis not present

## 2022-01-08 DIAGNOSIS — C679 Malignant neoplasm of bladder, unspecified: Secondary | ICD-10-CM

## 2022-01-08 DIAGNOSIS — C678 Malignant neoplasm of overlapping sites of bladder: Secondary | ICD-10-CM

## 2022-01-08 LAB — URINALYSIS, COMPLETE
Bilirubin, UA: NEGATIVE
Glucose, UA: NEGATIVE
Ketones, UA: NEGATIVE
Nitrite, UA: NEGATIVE
Specific Gravity, UA: 1.02 (ref 1.005–1.030)
Urobilinogen, Ur: 1 mg/dL (ref 0.2–1.0)
pH, UA: 6.5 (ref 5.0–7.5)

## 2022-01-08 LAB — MICROSCOPIC EXAMINATION

## 2022-01-08 MED ORDER — CEPHALEXIN 500 MG PO CAPS: 500.0000 mg | ORAL_CAPSULE | Freq: Three times a day (TID) | ORAL | 0 refills | Status: DC

## 2022-01-08 MED ORDER — CEPHALEXIN 250 MG PO CAPS
500.0000 mg | ORAL_CAPSULE | Freq: Once | ORAL | Status: AC
  Administered 2022-01-08: 500 mg via ORAL

## 2022-01-08 NOTE — Progress Notes (Signed)
50CC Lidocaine 2% instilled to bladder 30 min prior to procedure.

## 2022-01-09 ENCOUNTER — Encounter: Payer: Self-pay | Admitting: Physician Assistant

## 2022-01-09 ENCOUNTER — Ambulatory Visit: Payer: PPO | Admitting: Physician Assistant

## 2022-01-09 DIAGNOSIS — C679 Malignant neoplasm of bladder, unspecified: Secondary | ICD-10-CM

## 2022-01-09 NOTE — Progress Notes (Signed)
Catheter Removal  Patient is present today for a catheter removal.  28m of water was drained from the balloon. A 295KGsilicone foley cath was removed from the bladder, no complications were noted. Patient tolerated well.  Performed by: SDebroah Loop PA-C   Follow up/ Additional notes: 6 weeks with Dr. BErlene Quan

## 2022-01-11 NOTE — Progress Notes (Signed)
   01/11/22  CC:  Chief Complaint  Patient presents with   Procedure    HPI: 88 year old male with personal history of bladder cancer with multifocal recurrence who presents today for office fulguration.  Please see notes for previous details.  NED. A&Ox3.   No respiratory distress   Abd soft, NT, ND Normal phallus with bilateral descended testicles  Cystoscopy Procedure Note  He did receive periprocedural antibiotics in the form of Keflex.  Intravesical lidocaine was allowed to dwell for 30 minutes prior to the procedure.  Timeout and consent was confirmed prior to initiation of the procedure.  Patient identification was confirmed, informed consent was obtained, and patient was prepped using Betadine solution.  Lidocaine jelly was administered per urethral meatus.     Pre-Procedure: - Inspection reveals a normal caliber ureteral meatus.  Procedure: The flexible cystoscope was introduced without difficulty -Using Bugbee electrocautery and water is the medium active brown the patient, 3 distinct areas were fulgurated, 1 approximately 5 mm papillary lesion on the left lateral bladder wall adjacent to stellate scar, another at least 5 to 10 mm papillary more heaped up lesion on the left bladder neck which was treated using retroflexion as well as a lesion in the prostatic urethra which was about 3 mm.  Attempted to fulgurate these following down to the base of the tumor.  He tolerated the procedure well.  At the end of the procedure, there is a small amount of bleeding noted and due to manipulation within the prostatic urethra, I elected to place a 20 French Foley catheter.  Once the balloon was filled, I irrigate the bladder several times with water in order to remove all fulgurated debris.   Post-Procedure: - Patient tolerated the procedure well  Assessment/ Plan:  1. Malignant neoplasm of overlapping sites of bladder ([place]) Due to the size of the tumors as well as location, I  will really will plan to leave the Foley catheter in overnight due to concern for retention as well as possible bleeding  Return tomorrow for Foley removal  We will also send him home on 3 days of Keflex given the extent of fulguration today  Suspect he may need a subsequent fulguration, we will tentatively plan for this in the office again in 6 weeks due to his age and comorbidities, has elected outpatient management which seems reasonable and the procedure was extremely well-tolerated today   - Urinalysis, Complete - cephALEXin (KEFLEX) capsule 500 mg   Return for foley removal tomorrow, fulgeration in 6 weeks.  Hollice Espy, MD

## 2022-01-28 DIAGNOSIS — Z7901 Long term (current) use of anticoagulants: Secondary | ICD-10-CM | POA: Diagnosis not present

## 2022-01-28 DIAGNOSIS — I482 Chronic atrial fibrillation, unspecified: Secondary | ICD-10-CM | POA: Diagnosis not present

## 2022-02-11 DIAGNOSIS — Z7901 Long term (current) use of anticoagulants: Secondary | ICD-10-CM | POA: Diagnosis not present

## 2022-02-11 DIAGNOSIS — I482 Chronic atrial fibrillation, unspecified: Secondary | ICD-10-CM | POA: Diagnosis not present

## 2022-02-18 ENCOUNTER — Encounter: Payer: Self-pay | Admitting: Urology

## 2022-02-18 ENCOUNTER — Ambulatory Visit (INDEPENDENT_AMBULATORY_CARE_PROVIDER_SITE_OTHER): Payer: PPO | Admitting: Urology

## 2022-02-18 VITALS — BP 124/69 | HR 71 | Ht 70.0 in | Wt 188.0 lb

## 2022-02-18 DIAGNOSIS — N3289 Other specified disorders of bladder: Secondary | ICD-10-CM | POA: Diagnosis not present

## 2022-02-18 DIAGNOSIS — Z8551 Personal history of malignant neoplasm of bladder: Secondary | ICD-10-CM

## 2022-02-18 MED ORDER — CEPHALEXIN 250 MG PO CAPS
500.0000 mg | ORAL_CAPSULE | Freq: Once | ORAL | Status: AC
  Administered 2022-02-18: 500 mg via ORAL

## 2022-02-18 NOTE — Progress Notes (Signed)
Patient was cleaned and prepped in a sterile fashion with betadine and lidocaine 2% jelly was instilled into the urethra.  A 14FR catheter was inserted.  50 ml of Lidocaine 2% was instilled into the bladder. The catheter was then removed. Patient tolerated well, no complications were noted. Patient held in bladder for 30 minutes prior to procedure starting.   Performed by: S.Mikayah Joy, CMA

## 2022-02-18 NOTE — Progress Notes (Signed)
   02/18/22  CC:  Chief Complaint  Patient presents with   Procedure    Fulgeration    HPI: 88 year old male with personal history of bladder cancer with multifocal recurrence who presents for cystoscopy following for fairly extensive multifocal recurrence managed in the office with fulguration 6 weeks ago.  He is here today for repeat cystoscopy.  Please see previous notes for details.  NED. A&Ox3.   No respiratory distress   Abd soft, NT, ND Normal phallus with bilateral descended testicles  Cystoscopy Procedure Note  Patient identification was confirmed, informed consent was obtained, and patient was prepped using Betadine solution.  Lidocaine jelly was administered per urethral meatus.     Pre-Procedure: - Inspection reveals a normal caliber ureteral meatus.  Procedure: The flexible cystoscope was introduced without difficulty -Normal urethra which is patent -Prostate with bilobar coaptation, area of previous fulguration at the right base of the prostate notable but without recurrent tumor -Mildly trabeculated bladder with some debris although visualization fairly good today -No obvious tumor recurrence, very reassuring  -Retroflexion shows normal bladder neck with intravesical prostate protrusion but no tumor on bladder neck as previously appreciated   Post-Procedure: - Patient tolerated the procedure well  Assessment/ Plan:  1. Malignant neoplasm of overlapping sites of bladder [facility]) Status post successful office fulguration without obvious recurrence despite fairly robust recurrence  He tolerated in office fulguration quite well and today cystoscopy is very reassuring  Will plan for repeat cystoscopy in 3 months, he is agreeable this plan  Like to Keflex was given today - Urinalysis, Complete - cephALEXin (KEFLEX) capsule 500 mg   Return in about 3 months (around 05/20/2022) for cysto.  Hollice Espy, MD

## 2022-02-19 LAB — URINALYSIS, COMPLETE
Bilirubin, UA: NEGATIVE
Glucose, UA: NEGATIVE
Ketones, UA: NEGATIVE
Leukocytes,UA: NEGATIVE
Nitrite, UA: NEGATIVE
Protein,UA: NEGATIVE
Specific Gravity, UA: 1.025 (ref 1.005–1.030)
Urobilinogen, Ur: 1 mg/dL (ref 0.2–1.0)
pH, UA: 6 (ref 5.0–7.5)

## 2022-02-19 LAB — MICROSCOPIC EXAMINATION

## 2022-02-27 DIAGNOSIS — I482 Chronic atrial fibrillation, unspecified: Secondary | ICD-10-CM | POA: Diagnosis not present

## 2022-02-27 DIAGNOSIS — Z7901 Long term (current) use of anticoagulants: Secondary | ICD-10-CM | POA: Diagnosis not present

## 2022-03-30 DIAGNOSIS — Z0000 Encounter for general adult medical examination without abnormal findings: Secondary | ICD-10-CM | POA: Diagnosis not present

## 2022-03-30 DIAGNOSIS — I129 Hypertensive chronic kidney disease with stage 1 through stage 4 chronic kidney disease, or unspecified chronic kidney disease: Secondary | ICD-10-CM | POA: Diagnosis not present

## 2022-03-30 DIAGNOSIS — E039 Hypothyroidism, unspecified: Secondary | ICD-10-CM | POA: Diagnosis not present

## 2022-03-30 DIAGNOSIS — N183 Chronic kidney disease, stage 3 unspecified: Secondary | ICD-10-CM | POA: Diagnosis not present

## 2022-03-30 DIAGNOSIS — I2510 Atherosclerotic heart disease of native coronary artery without angina pectoris: Secondary | ICD-10-CM | POA: Diagnosis not present

## 2022-03-30 DIAGNOSIS — I482 Chronic atrial fibrillation, unspecified: Secondary | ICD-10-CM | POA: Diagnosis not present

## 2022-03-30 DIAGNOSIS — Z7901 Long term (current) use of anticoagulants: Secondary | ICD-10-CM | POA: Diagnosis not present

## 2022-04-13 ENCOUNTER — Other Ambulatory Visit: Payer: Self-pay | Admitting: Dermatology

## 2022-04-13 ENCOUNTER — Encounter: Payer: Self-pay | Admitting: Dermatology

## 2022-04-13 ENCOUNTER — Ambulatory Visit: Payer: PPO | Admitting: Dermatology

## 2022-04-13 VITALS — BP 144/79 | HR 56

## 2022-04-13 DIAGNOSIS — Z85828 Personal history of other malignant neoplasm of skin: Secondary | ICD-10-CM | POA: Diagnosis not present

## 2022-04-13 DIAGNOSIS — Z8589 Personal history of malignant neoplasm of other organs and systems: Secondary | ICD-10-CM

## 2022-04-13 DIAGNOSIS — D229 Melanocytic nevi, unspecified: Secondary | ICD-10-CM

## 2022-04-13 DIAGNOSIS — L570 Actinic keratosis: Secondary | ICD-10-CM

## 2022-04-13 DIAGNOSIS — D492 Neoplasm of unspecified behavior of bone, soft tissue, and skin: Secondary | ICD-10-CM

## 2022-04-13 DIAGNOSIS — L820 Inflamed seborrheic keratosis: Secondary | ICD-10-CM

## 2022-04-13 DIAGNOSIS — L578 Other skin changes due to chronic exposure to nonionizing radiation: Secondary | ICD-10-CM | POA: Diagnosis not present

## 2022-04-13 DIAGNOSIS — C44311 Basal cell carcinoma of skin of nose: Secondary | ICD-10-CM | POA: Diagnosis not present

## 2022-04-13 DIAGNOSIS — Z1283 Encounter for screening for malignant neoplasm of skin: Secondary | ICD-10-CM

## 2022-04-13 DIAGNOSIS — L821 Other seborrheic keratosis: Secondary | ICD-10-CM | POA: Diagnosis not present

## 2022-04-13 DIAGNOSIS — L814 Other melanin hyperpigmentation: Secondary | ICD-10-CM

## 2022-04-13 DIAGNOSIS — L219 Seborrheic dermatitis, unspecified: Secondary | ICD-10-CM

## 2022-04-13 NOTE — Patient Instructions (Addendum)
Cryotherapy Aftercare  Wash gently with soap and water everyday.   Apply Vaseline daily until healed.     Wound Care Instructions  Cleanse wound gently with soap and water once a day then pat dry with clean gauze. Apply a thin coat of Petrolatum (petroleum jelly, "Vaseline") over the wound (unless you have an allergy to this). We recommend that you use a new, sterile tube of Vaseline. Do not pick or remove scabs. Do not remove the yellow or white "healing tissue" from the base of the wound.  Cover the wound with fresh, clean, nonstick gauze and secure with paper tape. You may use Band-Aids in place of gauze and tape if the wound is small enough, but would recommend trimming much of the tape off as there is often too much. Sometimes Band-Aids can irritate the skin.  You should call the office for your biopsy report after 1 week if you have not already been contacted.  If you experience any problems, such as abnormal amounts of bleeding, swelling, significant bruising, significant pain, or evidence of infection, please call the office immediately.  FOR ADULT SURGERY PATIENTS: If you need something for pain relief you may take 1 extra strength Tylenol (acetaminophen) AND 2 Ibuprofen ('200mg'$  each) together every 4 hours as needed for pain. (do not take these if you are allergic to them or if you have a reason you should not take them.) Typically, you may only need pain medication for 1 to 3 days.      Recommend daily broad spectrum sunscreen SPF 30+ to sun-exposed areas, reapply every 2 hours as needed. Call for new or changing lesions.  Staying in the shade or wearing long sleeves, sun glasses (UVA+UVB protection) and wide brim hats (4-inch brim around the entire circumference of the hat) are also recommended for sun protection.    Melanoma ABCDEs  Melanoma is the most dangerous type of skin cancer, and is the leading cause of death from skin disease.  You are more likely to develop melanoma  if you: Have light-colored skin, light-colored eyes, or red or blond hair Spend a lot of time in the sun Tan regularly, either outdoors or in a tanning bed Have had blistering sunburns, especially during childhood Have a close family member who has had a melanoma Have atypical moles or large birthmarks  Early detection of melanoma is key since treatment is typically straightforward and cure rates are extremely high if we catch it early.   The first sign of melanoma is often a change in a mole or a new dark spot.  The ABCDE system is a way of remembering the signs of melanoma.  A for asymmetry:  The two halves do not match. B for border:  The edges of the growth are irregular. C for color:  A mixture of colors are present instead of an even brown color. D for diameter:  Melanomas are usually (but not always) greater than 32m - the size of a pencil eraser. E for evolution:  The spot keeps changing in size, shape, and color.  Please check your skin once per month between visits. You can use a small mirror in front and a large mirror behind you to keep an eye on the back side or your body.   If you see any new or changing lesions before your next follow-up, please call to schedule a visit.  Please continue daily skin protection including broad spectrum sunscreen SPF 30+ to sun-exposed areas, reapplying every 2 hours  as needed when you're outdoors.   Staying in the shade or wearing long sleeves, sun glasses (UVA+UVB protection) and wide brim hats (4-inch brim around the entire circumference of the hat) are also recommended for sun protection.    Due to recent changes in healthcare laws, you may see results of your pathology and/or laboratory studies on MyChart before the doctors have had a chance to review them. We understand that in some cases there may be results that are confusing or concerning to you. Please understand that not all results are received at the same time and often the doctors  may need to interpret multiple results in order to provide you with the best plan of care or course of treatment. Therefore, we ask that you please give [place] 2 business days to thoroughly review all your results before contacting the office for clarification. Should we see a critical lab result, you will be contacted sooner.   If You Need Anything After Your Visit  If you have any questions or concerns for your doctor, please call our main line at [phone] and press option 4 to reach your doctor's medical assistant. If no one answers, please leave a voicemail as directed and we will return your call as soon as possible. Messages left after 4 pm will be answered the following business day.   You may also send [place] a message via [place]. We typically respond to MyChart messages within 1-2 business days.  For prescription refills, please ask your pharmacy to contact our office. Our fax number is [phone].  If you have an urgent issue when the clinic is closed that cannot wait until the next business day, you can page your doctor at the number below.    Please note that while we do our best to be available for urgent issues outside of office hours, we are not available 24/7.   If you have an urgent issue and are unable to reach [place], you may choose to seek medical care at your doctor's office, retail clinic, urgent care center, or emergency room.  If you have a medical emergency, please immediately call 911 or go to the emergency department.  Pager Numbers  - Dr. Nehemiah Massed: [phone]  - Dr. Laurence Ferrari: [phone]  - Dr. Nicole Kindred: [phone]  In the event of inclement weather, please call our main line at [phone] for an update on the status of any delays or closures.  Dermatology Medication Tips: Please keep the boxes that topical medications come in in order to help keep track of the instructions about where and how to use these. Pharmacies typically print the medication instructions  only on the boxes and not directly on the medication tubes.   If your medication is too expensive, please contact our office at [phone] option 4 or send [place] a message through [place].   We are unable to tell what your co-pay for medications will be in advance as this is different depending on your insurance coverage. However, we may be able to find a substitute medication at lower cost or fill out paperwork to get insurance to cover a needed medication.   If a prior authorization is required to get your medication covered by your insurance company, please allow [place] 1-2 business days to complete this process.  Drug prices often vary depending on where the prescription is filled and some pharmacies may offer cheaper prices.  The website www.goodrx.com contains coupons for medications through different pharmacies. The prices here do not account for what  the cost may be with help from insurance (it may be cheaper with your insurance), but the website can give you the price if you did not use any insurance.  - You can print the associated coupon and take it with your prescription to the pharmacy.  - You may also stop by our office during regular business hours and pick up a GoodRx coupon card.  - If you need your prescription sent electronically to a different pharmacy, notify our office through [facility] or by phone at [phone] option 4.     Si Usted Necesita Algo Despus de Su Visita  Tambin puede enviarnos un mensaje a travs de Pharmacist, community. Por lo general respondemos a los mensajes de MyChart en el transcurso de 1 a 2 das hbiles.  Para renovar recetas, por favor pida a su farmacia que se ponga en contacto con nuestra oficina. Harland Dingwall de fax es [place] [phone].  Si tiene un asunto urgente cuando la clnica est cerrada y que no puede esperar hasta el siguiente da hbil, puede llamar/localizar a su doctor(a) al nmero que aparece a continuacin.   Por favor, tenga en  cuenta que aunque hacemos todo lo posible para estar disponibles para asuntos urgentes fuera del horario de [place], no estamos disponibles las 24 horas del da, los 7 das de la [place].   Si tiene un problema urgente y no puede comunicarse con nosotros, puede optar por buscar atencin mdica  en el consultorio de su doctor(a), en una clnica privada, en un centro de atencin urgente o en una sala de emergencias.  Si tiene Engineering geologist, por favor llame inmediatamente al 911 o vaya a la sala de emergencias.  Nmeros de bper  - Dr. Nehemiah Massed: [phone]  - Dra. Moye: [phone]  - Dra. Nicole Kindred: [phone]  En caso de inclemencias del [place], por favor llame a Johnsie Kindred principal al [phone] para una actualizacin sobre el [place] de cualquier retraso o cierre.  Consejos para la medicacin en dermatologa: Por favor, guarde las cajas en las que vienen los medicamentos de uso tpico para ayudarle a seguir las instrucciones sobre dnde y cmo usarlos. Las farmacias generalmente imprimen las instrucciones del medicamento slo en las cajas y no directamente en los tubos del [place].   Si su medicamento es muy caro, por favor, pngase en contacto con Zigmund Daniel llamando al [phone] y presione la opcin 4 o envenos un mensaje a travs de Pharmacist, community.   No podemos decirle cul ser su copago por los medicamentos por adelantado ya que esto es diferente dependiendo de la cobertura de su seguro. Sin embargo, es posible que podamos encontrar un medicamento sustituto a Electrical engineer un formulario para que el seguro cubra el medicamento que se considera necesario.   Si se requiere una autorizacin previa para que su compaa de seguros [place] su medicamento, por favor permtanos de 1 a 2 das hbiles para completar este proceso.  Los precios de los medicamentos varan con frecuencia dependiendo del Environmental consultant de dnde se surte la receta y alguna farmacias pueden ofrecer  precios ms baratos.  El sitio web www.goodrx.com tiene cupones para medicamentos de Airline pilot. Los precios aqu no tienen en cuenta lo que podra costar con la ayuda del seguro (puede ser ms barato con su seguro), pero el sitio web puede darle el precio si no utiliz Research scientist (physical sciences).  - Puede imprimir el cupn correspondiente y llevarlo con su receta a la farmacia.  - Tambin puede pasar  pasar por nuestra oficina durante el horario de atencin regular y recoger una tarjeta de cupones de GoodRx.  - Si necesita que su receta se enve electrnicamente a una farmacia diferente, informe a nuestra oficina a travs de MyChart de [place] o por telfono llamando al [phone] y presione la opcin 4.  

## 2022-04-13 NOTE — Progress Notes (Unsigned)
Follow-Up Visit   Subjective  Carl Coffey is a 89 y.o. male who presents for the following: Annual Exam (Hx of BCC's. Most recent was right nasal dorsum. HxSCC). The patient presents for Upper Body Skin Exam (UBSE) for skin cancer screening and mole check.  The patient has spots, moles and lesions to be evaluated, some may be new or changing and the patient has concerns that these could be cancer.  The following portions of the chart were reviewed this encounter and updated as appropriate:  Tobacco  Allergies  Meds  Problems  Med Hx  Surg Hx  Fam Hx     Review of Systems: No other skin or systemic complaints except as noted in HPI or Assessment and Plan.  Objective  Well appearing patient in no apparent distress; mood and affect are within normal limits.  All skin waist up examined.  face 18 (18) Erythematous thin papules/macules with gritty scale.   torso and arm x8 (8) Erythematous keratotic or waxy stuck-on papule or plaque.  Right Dorsum Nose 0.3 cm pink papule      Assessment & Plan   History of Basal Cell Carcinoma of the Skin - No evidence of recurrence today - Recommend regular full body skin exams - Recommend daily broad spectrum sunscreen SPF 30+ to sun-exposed areas, reapply every 2 hours as needed.  - Call if any new or changing lesions are noted between office visits   History of Squamous Cell Carcinoma of the Skin - No evidence of recurrence today - No lymphadenopathy - Recommend regular full body skin exams - Recommend daily broad spectrum sunscreen SPF 30+ to sun-exposed areas, reapply every 2 hours as needed.  - Call if any new or changing lesions are noted between office visits  Lentigines - Scattered tan macules - Due to sun exposure - Benign-appearing, observe - Recommend daily broad spectrum sunscreen SPF 30+ to sun-exposed areas, reapply every 2 hours as needed. - Call for any changes  Seborrheic Keratoses - Stuck-on, waxy,  tan-brown papules and/or plaques  - Benign-appearing - Discussed benign etiology and prognosis. - Observe - Call for any changes  Melanocytic Nevi - Tan-brown and/or pink-flesh-colored symmetric macules and papules - Benign appearing on exam today - Observation - Call clinic for new or changing moles - Recommend daily use of broad spectrum spf 30+ sunscreen to sun-exposed areas.   Hemangiomas - Red papules - Discussed benign nature - Observe - Call for any changes  Actinic Damage - Chronic condition, secondary to cumulative UV/sun exposure - diffuse scaly erythematous macules with underlying dyspigmentation - Recommend daily broad spectrum sunscreen SPF 30+ to sun-exposed areas, reapply every 2 hours as needed.  - Staying in the shade or wearing long sleeves, sun glasses (UVA+UVB protection) and wide brim hats (4-inch brim around the entire circumference of the hat) are also recommended for sun protection.  - Call for new or changing lesions.  Skin cancer screening performed today.  AK (actinic keratosis) (18) face 18 Actinic keratoses are precancerous spots that appear secondary to cumulative UV radiation exposure/sun exposure over time. They are chronic with expected duration over 1 year. A portion of actinic keratoses will progress to squamous cell carcinoma of the skin. It is not possible to reliably predict which spots will progress to skin cancer and so treatment is recommended to prevent development of skin cancer.  Recommend daily broad spectrum sunscreen SPF 30+ to sun-exposed areas, reapply every 2 hours as needed.  Recommend staying in the  shade or wearing long sleeves, sun glasses (UVA+UVB protection) and wide brim hats (4-inch brim around the entire circumference of the hat). Call for new or changing lesions.  Destruction of lesion - face 18 Complexity: simple   Destruction method: cryotherapy   Informed consent: discussed and consent obtained   Timeout:  patient  name, date of birth, surgical site, and procedure verified Lesion destroyed using liquid nitrogen: Yes   Region frozen until ice ball extended beyond lesion: Yes   Outcome: patient tolerated procedure well with no complications   Post-procedure details: wound care instructions given   Additional details:  Prior to procedure, discussed risks of blister formation, small wound, skin dyspigmentation, or rare scar following cryotherapy. Recommend Vaseline ointment to treated areas while healing.   Inflamed seborrheic keratosis (8) torso and arm x8 Symptomatic, irritating, patient would like treated. Destruction of lesion - torso and arm x8 Complexity: simple   Destruction method: cryotherapy   Informed consent: discussed and consent obtained   Timeout:  patient name, date of birth, surgical site, and procedure verified Lesion destroyed using liquid nitrogen: Yes   Region frozen until ice ball extended beyond lesion: Yes   Outcome: patient tolerated procedure well with no complications   Post-procedure details: wound care instructions given   Additional details:  Prior to procedure, discussed risks of blister formation, small wound, skin dyspigmentation, or rare scar following cryotherapy. Recommend Vaseline ointment to treated areas while healing.   Neoplasm of skin Right Dorsum Nose Epidermal / dermal shaving  Lesion diameter (cm):  0.3 Informed consent: discussed and consent obtained   Timeout: patient name, date of birth, surgical site, and procedure verified   Procedure prep:  Patient was prepped and draped in usual sterile fashion Prep type:  Isopropyl alcohol Anesthesia: the lesion was anesthetized in a standard fashion   Anesthetic:  1% lidocaine w/ epinephrine 1-100,000 buffered w/ 8.4% NaHCO3 Instrument used: flexible razor blade   Hemostasis achieved with: pressure, aluminum chloride and electrodesiccation   Outcome: patient tolerated procedure well   Post-procedure details:  sterile dressing applied and wound care instructions given   Dressing type: bandage and petrolatum    Destruction of lesion Complexity: extensive   Destruction method: electrodesiccation and curettage   Informed consent: discussed and consent obtained   Timeout:  patient name, date of birth, surgical site, and procedure verified Procedure prep:  Patient was prepped and draped in usual sterile fashion Prep type:  Isopropyl alcohol Anesthesia: the lesion was anesthetized in a standard fashion   Anesthetic:  1% lidocaine w/ epinephrine 1-100,000 buffered w/ 8.4% NaHCO3 Curettage performed in three different directions: Yes   Electrodesiccation performed over the curetted area: Yes   Curettage cycles:  3 Lesion length (cm):  0.3 Lesion width (cm):  0.3 Margin per side (cm):  0.2 Final wound size (cm):  0.7 Hemostasis achieved with:  pressure and aluminum chloride Outcome: patient tolerated procedure well with no complications   Post-procedure details: sterile dressing applied and wound care instructions given   Dressing type: bandage and petrolatum    Specimen 1 - Surgical pathology Differential Diagnosis: R/O recurrent BCC Check Margins: No Previous pathology: [id number]  Skin cancer screening Actinic skin damage History of basal cell carcinoma History of squamous cell carcinoma Lentigo Melanocytic nevus, unspecified location Seborrheic keratosis  Return in about 6 months (around 10/12/2022) for TBSE, HxSCC, HxBCC.  I, Emelia Salisbury, CMA, am acting as scribe for Sarina Ser, MD. Documentation: I have reviewed the above documentation for accuracy  and completeness, and I agree with the above.  Sarina Ser, MD

## 2022-04-17 ENCOUNTER — Encounter: Payer: Self-pay | Admitting: Dermatology

## 2022-04-21 ENCOUNTER — Telehealth: Payer: Self-pay

## 2022-04-21 NOTE — Telephone Encounter (Signed)
Advised pt of bx results/sh ?

## 2022-04-21 NOTE — Telephone Encounter (Signed)
-----   Message from Ralene Bathe, MD sent at 04/21/2022  9:37 AM EST ----- Diagnosis Skin , right dorsum nose BASAL CELL CARCINOMA, NODULAR PATTERN, BASE INVOLVED  Cancer - BCC Recurrent? Small lesion Already treated Recheck next visit

## 2022-04-30 DIAGNOSIS — I2510 Atherosclerotic heart disease of native coronary artery without angina pectoris: Secondary | ICD-10-CM | POA: Diagnosis not present

## 2022-04-30 DIAGNOSIS — E039 Hypothyroidism, unspecified: Secondary | ICD-10-CM | POA: Diagnosis not present

## 2022-04-30 DIAGNOSIS — R7309 Other abnormal glucose: Secondary | ICD-10-CM | POA: Diagnosis not present

## 2022-04-30 DIAGNOSIS — I482 Chronic atrial fibrillation, unspecified: Secondary | ICD-10-CM | POA: Diagnosis not present

## 2022-04-30 DIAGNOSIS — N183 Chronic kidney disease, stage 3 unspecified: Secondary | ICD-10-CM | POA: Diagnosis not present

## 2022-04-30 DIAGNOSIS — Z7901 Long term (current) use of anticoagulants: Secondary | ICD-10-CM | POA: Diagnosis not present

## 2022-04-30 DIAGNOSIS — I129 Hypertensive chronic kidney disease with stage 1 through stage 4 chronic kidney disease, or unspecified chronic kidney disease: Secondary | ICD-10-CM | POA: Diagnosis not present

## 2022-05-20 ENCOUNTER — Ambulatory Visit: Payer: PPO | Admitting: Urology

## 2022-05-20 VITALS — BP 155/66 | HR 67 | Ht 70.0 in | Wt 190.5 lb

## 2022-05-20 DIAGNOSIS — Z8551 Personal history of malignant neoplasm of bladder: Secondary | ICD-10-CM

## 2022-05-20 DIAGNOSIS — C679 Malignant neoplasm of bladder, unspecified: Secondary | ICD-10-CM

## 2022-05-20 LAB — URINALYSIS, COMPLETE
Bilirubin, UA: NEGATIVE
Glucose, UA: NEGATIVE
Ketones, UA: NEGATIVE
Nitrite, UA: NEGATIVE
Specific Gravity, UA: >1.03 — ABNORMAL HIGH (ref 1.005–1.030)
Urobilinogen, Ur: 0.2 mg/dL (ref 0.2–1.0)
pH, UA: 5.5 (ref 5.0–7.5)

## 2022-05-20 LAB — MICROSCOPIC EXAMINATION

## 2022-05-20 MED ORDER — CEPHALEXIN 250 MG PO CAPS
500.0000 mg | ORAL_CAPSULE | Freq: Once | ORAL | Status: AC
  Administered 2022-05-20: 500 mg via ORAL

## 2022-05-20 NOTE — Progress Notes (Signed)
   05/20/22  CC:  Chief Complaint  Patient presents with   Cysto    HPI:  89 y.o. male with a personal history of bladder cancer, who presents today for a surveillance cystoscopy.    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  Received intravesical gemcitabine post op.     He underwent fulguration of a very small possible early recurrence which was a 2 mm area adjacent to the left bladder neck at the inferior pole of a stellate scar on the lateral wall on 7/22.   Elected to abstain from BCG.   He had a complex multifocal recurrence and underwent an office fulguration on 01/08/2022 which was well-tolerated.  A follow-up cystoscopy showed no obvious residual disease.  NED. A&Ox3.   No respiratory distress   Abd soft, NT, ND Normal phallus with bilateral descended testicles  Cystoscopy Procedure Note  Patient identification was confirmed, informed consent was obtained, and patient was prepped using Betadine solution.  Lidocaine jelly was administered per urethral meatus.     Pre-Procedure: - Inspection reveals a normal caliber ureteral meatus.  Procedure: The flexible cystoscope was introduced without difficulty -Normal urethra which is patent -Prostate with bilobar coaptation, area of previous fulguration at the right base of the prostate notable but without recurrent tumor -Mildly trabeculated bladder with some debris although visualization fairly good today -No tumors seen  -Retroflexion shows normal bladder neck with intravesical prostate protrusion    Post-Procedure: - Patient tolerated the procedure well  Assessment/ Plan:  1. Malignant neoplasm of overlapping sites of bladder ([place]) No evidence of disease today which is very reassuring  Single dose of Keflex given today given his frailty  Given his age and frailty, will transition back to every 6 month cystoscopies which he is agreeable to  - Urinalysis, Complete - cephALEXin (KEFLEX)  capsule 500 mg  F/u cysto 6 mo  Hollice Espy, MD

## 2022-05-29 DIAGNOSIS — Z7901 Long term (current) use of anticoagulants: Secondary | ICD-10-CM | POA: Diagnosis not present

## 2022-05-29 DIAGNOSIS — I482 Chronic atrial fibrillation, unspecified: Secondary | ICD-10-CM | POA: Diagnosis not present

## 2022-07-01 DIAGNOSIS — Z7901 Long term (current) use of anticoagulants: Secondary | ICD-10-CM | POA: Diagnosis not present

## 2022-07-01 DIAGNOSIS — I482 Chronic atrial fibrillation, unspecified: Secondary | ICD-10-CM | POA: Diagnosis not present

## 2022-07-26 NOTE — Progress Notes (Unsigned)
Cardiology Office Note  Date:  07/27/2022   ID:  Carl Coffey, DOB [birth year], MRN [id number]  PCP:  Carl Regulus, MD   Chief Complaint  Patient presents with   New Patient (Initial Visit)    Establish care for CAD; CABG x 3 in 1995. Medications reviewed by the patient verbally. "Doing well."     HPI:  [name] is a 89 year old gentleman with past medical history of Permanent atrial fibrillation treated with rate control and anticoagulation Coronary artery disease, history of CABG x3 April 96 Essential hypertension Hyperlipidemia Peripheral edema Previously followed by Carl Coffey last seen March 2023 Who presents to establish care for his coronary disease and atrial fibrillation  Permanent atrial fibrillation A-fib dating back many years, detailed on EKG March 2017 Tolerating warfarin  Spends his time helping to manage his house, take care of his wife who has dementia  Blood pressure low on today's visit, denies orthostasis symptoms  Denies chest pain concerning for angina, trivial ankle  edema Takes Lasix 20 mg every other day  Lab work reviewed A1c 5.8 Total cholesterol 126 LDL 57 TSH 1.9  EKG personally reviewed by myself on todays visit Atrial fibrillation with ventricular rate 60 bpm right bundle branch block, left anterior fascicular block  PMH:   has a past medical history of A-fib (HCC), Basal cell carcinoma (10/13/2017), Basal cell carcinoma (10/09/2020), Basal cell carcinoma (10/08/2021), Basal cell carcinoma (04/13/2022), Coronary artery disease, Dysplastic nevus (02/10/2016), Hypertension, Hypothyroidism, Myocardial infarction (HCC), Squamous cell carcinoma of skin (07/26/2007), and Thyroid disease.  PSH:    Past Surgical History:  Procedure Laterality Date   CARDIAC SURGERY  1995   CABG x 3   CATARACT EXTRACTION, BILATERAL     CORONARY ARTERY BYPASS GRAFT  1995   triple   CYSTOSCOPY W/ RETROGRADES Bilateral 02/20/2019   Procedure: CYSTOSCOPY  WITH RETROGRADE PYELOGRAM;  Surgeon: Vanna Scotland, MD;  Location: [facility];  Service: Urology;  Laterality: Bilateral;   HERNIA REPAIR Bilateral    MEATOTOMY N/A 02/20/2019   Procedure: MEATOTOMY ADULT;  Surgeon: Vanna Scotland, MD;  Location: [facility];  Service: Urology;  Laterality: N/A;   TRANSURETHRAL RESECTION OF BLADDER TUMOR WITH MITOMYCIN-C N/A 02/20/2019   Procedure: TRANSURETHRAL RESECTION OF BLADDER TUMOR WITH Gemcitabine;  Surgeon: Vanna Scotland, MD;  Location: [facility];  Service: Urology;  Laterality: N/A;    Current Outpatient Medications  Medication Sig Dispense Refill   amLODipine (NORVASC) 5 MG tablet Take 5 mg by mouth daily.     aspirin EC 81 MG tablet Take 81 mg by mouth daily.     finasteride (PROSCAR) 5 MG tablet Take 5 mg by mouth daily.      furosemide (LASIX) 20 MG tablet Take 1 tablet by mouth every other day.     gabapentin (NEURONTIN) 100 MG capsule Take 100 mg by mouth at bedtime.     ketoconazole (NIZORAL) 2 % shampoo APPLY 1 APPLICATION TOPICALLY 3 TIMES A WEEK, SHAMPOO SCALP 3 TIMES A WEEK, LET SIT FOR 5 MINUTES BEFORE RINSING OUT 120 mL 6   levothyroxine (SYNTHROID, LEVOTHROID) 75 MCG tablet Take 75 mcg by mouth daily before breakfast.      simvastatin (ZOCOR) 10 MG tablet Take 10 mg by mouth at bedtime.     warfarin (COUMADIN) 2.5 MG tablet Take 2.5 mg by mouth daily.      albuterol (VENTOLIN HFA) 108 (90 Base) MCG/ACT inhaler Inhale 2 puffs into the lungs every 6 (six) hours as needed  for wheezing or shortness of breath. (Patient not taking: Reported on 07/27/2022) 8 g 2   clotrimazole-betamethasone (LOTRISONE) cream Apply topically 2 (two) times daily. (Patient not taking: Reported on 07/27/2022)     Current Facility-Administered Medications  Medication Dose Route Frequency Provider Last Rate Last Admin   gemcitabine (GEMZAR) 2,000 mg in sodium chloride irrigation 0.9 % chemo infusion  2,000 mg Irrigation Once Vanna Scotland, MD       gemcitabine  (GEMZAR) 2,000 mg in sodium chloride irrigation 0.9 % chemo infusion  2,000 mg Irrigation Once Vanna Scotland, MD         Allergies:   Flexeril [cyclobenzaprine]   Social History:  The patient  reports that he has never smoked. He has never been exposed to tobacco smoke. He has never used smokeless tobacco. He reports that he does not drink alcohol and does not use drugs.   Family History:   family history is not on file.    Review of Systems: Review of Systems  Constitutional: Negative.   HENT: Negative.    Respiratory: Negative.    Cardiovascular: Negative.   Gastrointestinal: Negative.   Musculoskeletal: Negative.   Neurological: Negative.   Psychiatric/Behavioral: Negative.    All other systems reviewed and are negative.    PHYSICAL EXAM: VS:  BP 100/60 (BP Location: Right Arm, Patient Position: Sitting, Cuff Size: Normal)   Pulse 60   Ht 5\' 10"  (1.778 m)   Wt 189 lb (85.7 kg)   SpO2 98%   BMI 27.12 kg/m  , BMI Body mass index is 27.12 kg/m. GEN: Well nourished, well developed, in no acute distress HEENT: normal Neck: no JVD, carotid bruits, or masses Cardiac: RRR; no murmurs, rubs, or gallops, Trace ankle swelling Respiratory:  clear to auscultation bilaterally, normal work of breathing GI: soft, nontender, nondistended, + BS MS: no deformity or atrophy Skin: warm and dry, no rash Neuro:  Strength and sensation are intact Psych: euthymic mood, full affect   Recent Labs: 12/14/2021: ALT 21; BUN 28; Creatinine, Ser 0.99; Hemoglobin 14.4; Platelets 173; Potassium 4.0; Sodium 137    Lipid Panel No results found for: "CHOL", "HDL", "LDLCALC", "TRIG"    Wt Readings from Last 3 Encounters:  07/27/22 189 lb (85.7 kg)  05/20/22 190 lb 8 oz (86.4 kg)  02/18/22 188 lb (85.3 kg)     ASSESSMENT AND PLAN:  Problem List Items Addressed This Visit       Cardiology Problems   Chronic atrial fibrillation (HCC) - Primary   Relevant Orders   EKG 12-Lead    Coronary atherosclerosis   Relevant Orders   EKG 12-Lead   Hypertension, benign   Relevant Orders   EKG 12-Lead   Hyperlipidemia   Relevant Orders   EKG 12-Lead   Coronary artery disease with stable angina, history of CABG Currently with no symptoms of angina. No further workup at this time. Continue current medication regimen.  Hyperlipidemia Cholesterol is at goal on the current lipid regimen. No changes to the medications were made.  On simvastatin  Permanent atrial fibrillation Not on medications for rate control, appears metoprolol previously held possibly for hypotension Tolerating warfarin, managed through primary care  Essential hypertension On amlodipine, Prior notes from Carl Coffey indicating he was on metoprolol succinate 25 daily but this is not on the [facility] medication list  Ankle swelling On Lasix 20 every other day Recommended extra Lasix as needed for worsening ankle swelling   Total encounter time more than 60 minutes  Greater than 50% was spent in counseling and coordination of care with the patient    Signed, Carl Coffey, M.D., Ph.D. [place], [place]

## 2022-07-27 ENCOUNTER — Ambulatory Visit: Payer: PPO | Attending: Cardiovascular Disease | Admitting: Cardiovascular Disease

## 2022-07-27 ENCOUNTER — Encounter: Payer: Self-pay | Admitting: Cardiovascular Disease

## 2022-07-27 VITALS — BP 100/60 | HR 60 | Ht 70.0 in | Wt 189.0 lb

## 2022-07-27 DIAGNOSIS — I25118 Atherosclerotic heart disease of native coronary artery with other forms of angina pectoris: Secondary | ICD-10-CM | POA: Diagnosis not present

## 2022-07-27 DIAGNOSIS — I10 Essential (primary) hypertension: Secondary | ICD-10-CM | POA: Diagnosis not present

## 2022-07-27 DIAGNOSIS — E782 Mixed hyperlipidemia: Secondary | ICD-10-CM

## 2022-07-27 DIAGNOSIS — I482 Chronic atrial fibrillation, unspecified: Secondary | ICD-10-CM

## 2022-07-27 NOTE — Patient Instructions (Addendum)
Medication Instructions:  No changes  If you need a refill on your cardiac medications before your next appointment, please call your pharmacy.    Lab work: No new labs needed   Testing/Procedures: No new testing needed   Follow-Up: At [facility], you and your health needs are our priority.  As part of our continuing mission to provide you with exceptional heart care, we have created designated Provider Care Teams.  These Care Teams include your primary Cardiologist (physician) and Advanced Practice Providers (APPs -  Physician Assistants and Nurse Practitioners) who all work together to provide you with the care you need, when you need it.  You will need a follow up appointment in 6 months  Providers on your designated Care Team:   Christopher Berge, NP Ryan Dunn, PA-C Cadence Furth, PA-C  COVID-19 Vaccine Information can be found at: [email or web address] For questions related to vaccine distribution or appointments, please email [email or web address] or call [phone].   

## 2022-07-29 DIAGNOSIS — I482 Chronic atrial fibrillation, unspecified: Secondary | ICD-10-CM | POA: Diagnosis not present

## 2022-07-29 DIAGNOSIS — Z7901 Long term (current) use of anticoagulants: Secondary | ICD-10-CM | POA: Diagnosis not present

## 2022-08-28 DIAGNOSIS — Z7901 Long term (current) use of anticoagulants: Secondary | ICD-10-CM | POA: Diagnosis not present

## 2022-08-28 DIAGNOSIS — I482 Chronic atrial fibrillation, unspecified: Secondary | ICD-10-CM | POA: Diagnosis not present

## 2022-09-14 DIAGNOSIS — Z7901 Long term (current) use of anticoagulants: Secondary | ICD-10-CM | POA: Diagnosis not present

## 2022-09-23 ENCOUNTER — Ambulatory Visit: Payer: PPO | Admitting: Dermatology

## 2022-09-23 DIAGNOSIS — Z872 Personal history of diseases of the skin and subcutaneous tissue: Secondary | ICD-10-CM | POA: Diagnosis not present

## 2022-09-23 DIAGNOSIS — Z1283 Encounter for screening for malignant neoplasm of skin: Secondary | ICD-10-CM

## 2022-09-23 DIAGNOSIS — W908XXA Exposure to other nonionizing radiation, initial encounter: Secondary | ICD-10-CM | POA: Diagnosis not present

## 2022-09-23 DIAGNOSIS — Z85828 Personal history of other malignant neoplasm of skin: Secondary | ICD-10-CM

## 2022-09-23 DIAGNOSIS — L821 Other seborrheic keratosis: Secondary | ICD-10-CM | POA: Diagnosis not present

## 2022-09-23 DIAGNOSIS — L570 Actinic keratosis: Secondary | ICD-10-CM | POA: Diagnosis not present

## 2022-09-23 DIAGNOSIS — L814 Other melanin hyperpigmentation: Secondary | ICD-10-CM

## 2022-09-23 DIAGNOSIS — D229 Melanocytic nevi, unspecified: Secondary | ICD-10-CM

## 2022-09-23 DIAGNOSIS — D1801 Hemangioma of skin and subcutaneous tissue: Secondary | ICD-10-CM

## 2022-09-23 DIAGNOSIS — L820 Inflamed seborrheic keratosis: Secondary | ICD-10-CM | POA: Diagnosis not present

## 2022-09-23 DIAGNOSIS — L578 Other skin changes due to chronic exposure to nonionizing radiation: Secondary | ICD-10-CM

## 2022-09-23 DIAGNOSIS — Z8589 Personal history of malignant neoplasm of other organs and systems: Secondary | ICD-10-CM

## 2022-09-23 NOTE — Progress Notes (Signed)
Follow-Up Visit   Subjective  Carl Coffey is a 89 y.o. male who presents for the following:6 months f/u,  Skin Cancer Screening and Upper Body Skin Exam, hx of SCC, hx of BCC, hx of precancers, patient c/o irritated spot on his scalp.  The patient presents for Upper Body Skin Exam (UBSE) for skin cancer screening and mole check. The patient has spots, moles and lesions to be evaluated, some may be new or changing and the patient may have concern these could be cancer.  The following portions of the chart were reviewed this encounter and updated as appropriate: medications, allergies, medical history  Review of Systems:  No other skin or systemic complaints except as noted in HPI or Assessment and Plan.  Objective  Well appearing patient in no apparent distress; mood and affect are within normal limits.  All skin waist up examined. Relevant physical exam findings are noted in the Assessment and Plan.  scalp, trunk x 17 (17) Stuck-on, waxy, tan-brown papules --Discussed benign etiology and prognosis.   hands, face x 15 (15) Erythematous thin papules/macules with gritty scale.    Assessment & Plan   Inflamed seborrheic keratosis (17) scalp, trunk x 17  Symptomatic, irritating, patient would like treated.   Destruction of lesion - scalp, trunk x 17 (17) Complexity: simple   Destruction method: cryotherapy   Informed consent: discussed and consent obtained   Timeout:  patient name, date of birth, surgical site, and procedure verified Lesion destroyed using liquid nitrogen: Yes   Region frozen until ice ball extended beyond lesion: Yes   Outcome: patient tolerated procedure well with no complications   Post-procedure details: wound care instructions given    AK (actinic keratosis) (15) hands, face x 15  Actinic keratoses are precancerous spots that appear secondary to cumulative UV radiation exposure/sun exposure over time. They are chronic with expected duration over 1 year.  A portion of actinic keratoses will progress to squamous cell carcinoma of the skin. It is not possible to reliably predict which spots will progress to skin cancer and so treatment is recommended to prevent development of skin cancer.  Recommend daily broad spectrum sunscreen SPF 30+ to sun-exposed areas, reapply every 2 hours as needed.  Recommend staying in the shade or wearing long sleeves, sun glasses (UVA+UVB protection) and wide brim hats (4-inch brim around the entire circumference of the hat). Call for new or changing lesions.   Destruction of lesion - hands, face x 15 (15) Complexity: simple   Destruction method: cryotherapy   Informed consent: discussed and consent obtained   Timeout:  patient name, date of birth, surgical site, and procedure verified Lesion destroyed using liquid nitrogen: Yes   Region frozen until ice ball extended beyond lesion: Yes   Outcome: patient tolerated procedure well with no complications   Post-procedure details: wound care instructions given     Skin cancer screening performed today.  Actinic Damage - Chronic condition, secondary to cumulative UV/sun exposure - diffuse scaly erythematous macules with underlying dyspigmentation - Recommend daily broad spectrum sunscreen SPF 30+ to sun-exposed areas, reapply every 2 hours as needed.  - Staying in the shade or wearing long sleeves, sun glasses (UVA+UVB protection) and wide brim hats (4-inch brim around the entire circumference of the hat) are also recommended for sun protection.  - Call for new or changing lesions.  Lentigines, Seborrheic Keratoses, Hemangiomas - Benign normal skin lesions - Benign-appearing - Call for any changes  Melanocytic Nevi - Tan-brown and/or  pink-flesh-colored symmetric macules and papules - Benign appearing on exam today - Observation - Call clinic for new or changing moles - Recommend daily use of broad spectrum spf 30+ sunscreen to sun-exposed areas.   HISTORY OF  BASAL CELL CARCINOMA OF THE SKIN - No evidence of recurrence today - Recommend regular full body skin exams - Recommend daily broad spectrum sunscreen SPF 30+ to sun-exposed areas, reapply every 2 hours as needed.  - Call if any new or changing lesions are noted between office visits   HISTORY OF SQUAMOUS CELL CARCINOMA OF THE SKIN - No evidence of recurrence today - No lymphadenopathy - Recommend regular full body skin exams - Recommend daily broad spectrum sunscreen SPF 30+ to sun-exposed areas, reapply every 2 hours as needed.  - Call if any new or changing lesions are noted between office visits   Return in about 6 months (around 03/26/2023) for UBSE, hx of SCC, hx of BCC, hx of AKs .  IAngelique Holm, CMA, am acting as scribe for Armida Sans, MD .   Documentation: I have reviewed the above documentation for accuracy and completeness, and I agree with the above.  Armida Sans, MD

## 2022-09-23 NOTE — Patient Instructions (Addendum)
Cryotherapy Aftercare  Wash gently with soap and water everyday.   Apply Vaseline and Band-Aid daily until healed.     Due to recent changes in healthcare laws, you may see results of your pathology and/or laboratory studies on MyChart before the doctors have had a chance to review them. We understand that in some cases there may be results that are confusing or concerning to you. Please understand that not all results are received at the same time and often the doctors may need to interpret multiple results in order to provide you with the best plan of care or course of treatment. Therefore, we ask that you please give us 2 business days to thoroughly review all your results before contacting the office for clarification. Should we see a critical lab result, you will be contacted sooner.   If You Need Anything After Your Visit  If you have any questions or concerns for your doctor, please call our main line at [phone] and press option 4 to reach your doctor's medical assistant. If no one answers, please leave a voicemail as directed and we will return your call as soon as possible. Messages left after 4 pm will be answered the following business day.   You may also send us a message via MyChart. We typically respond to MyChart messages within 1-2 business days.  For prescription refills, please ask your pharmacy to contact our office. Our fax number is [phone].  If you have an urgent issue when the clinic is closed that cannot wait until the next business day, you can page your doctor at the number below.    Please note that while we do our best to be available for urgent issues outside of office hours, we are not available 24/7.   If you have an urgent issue and are unable to reach us, you may choose to seek medical care at your doctor's office, retail clinic, urgent care center, or emergency room.  If you have a medical emergency, please immediately call 911 or go to the  emergency department.  Pager Numbers  - Dr. Kowalski: [phone]  - Dr. Moye: [phone]  - Dr. Stewart: [phone]  In the event of inclement weather, please call our main line at [phone] for an update on the status of any delays or closures.  Dermatology Medication Tips: Please keep the boxes that topical medications come in in order to help keep track of the instructions about where and how to use these. Pharmacies typically print the medication instructions only on the boxes and not directly on the medication tubes.   If your medication is too expensive, please contact our office at [phone] option 4 or send us a message through MyChart.   We are unable to tell what your co-pay for medications will be in advance as this is different depending on your insurance coverage. However, we may be able to find a substitute medication at lower cost or fill out paperwork to get insurance to cover a needed medication.   If a prior authorization is required to get your medication covered by your insurance company, please allow us 1-2 business days to complete this process.  Drug prices often vary depending on where the prescription is filled and some pharmacies may offer cheaper prices.  The website www.goodrx.com contains coupons for medications through different pharmacies. The prices here do not account for what the cost may be with help from insurance (it may be cheaper with your insurance), but the website can   give you the price if you did not use any insurance.  - You can print the associated coupon and take it with your prescription to the pharmacy.  - You may also stop by our office during regular business hours and pick up a GoodRx coupon card.  - If you need your prescription sent electronically to a different pharmacy, notify our office through [place] MyChart or by phone at [phone] option 4.     Si Usted Necesita Algo Despus de Su Visita  Tambin puede  enviarnos un mensaje a travs de MyChart. Por lo general respondemos a los mensajes de MyChart en el transcurso de 1 a 2 das hbiles.  Para renovar recetas, por favor pida a su farmacia que se ponga en contacto con nuestra oficina. Nuestro nmero de fax es el [phone].  Si tiene un asunto urgente cuando la clnica est cerrada y que no puede esperar hasta el siguiente da hbil, puede llamar/localizar a su doctor(a) al nmero que aparece a continuacin.   Por favor, tenga en cuenta que aunque hacemos todo lo posible para estar disponibles para asuntos urgentes fuera del horario de oficina, no estamos disponibles las 24 horas del da, los 7 das de la semana.   Si tiene un problema urgente y no puede comunicarse con nosotros, puede optar por buscar atencin mdica  en el consultorio de su doctor(a), en una clnica privada, en un centro de atencin urgente o en una sala de emergencias.  Si tiene una emergencia mdica, por favor llame inmediatamente al 911 o vaya a la sala de emergencias.  Nmeros de bper  - Dr. Kowalski: [phone]  - Dra. Moye: [phone]  - Dra. Stewart: [phone]  En caso de inclemencias del tiempo, por favor llame a nuestra lnea principal al [phone] para una actualizacin sobre el estado de cualquier retraso o cierre.  Consejos para la medicacin en dermatologa: Por favor, guarde las cajas en las que vienen los medicamentos de uso tpico para ayudarle a seguir las instrucciones sobre dnde y cmo usarlos. Las farmacias generalmente imprimen las instrucciones del medicamento slo en las cajas y no directamente en los tubos del medicamento.   Si su medicamento es muy caro, por favor, pngase en contacto con nuestra oficina llamando al [phone] y presione la opcin 4 o envenos un mensaje a travs de MyChart.   No podemos decirle cul ser su copago por los medicamentos por adelantado ya que esto es diferente dependiendo de la cobertura de su seguro.  Sin embargo, es posible que podamos encontrar un medicamento sustituto a menor costo o llenar un formulario para que el seguro cubra el medicamento que se considera necesario.   Si se requiere una autorizacin previa para que su compaa de seguros cubra su medicamento, por favor permtanos de 1 a 2 das hbiles para completar este proceso.  Los precios de los medicamentos varan con frecuencia dependiendo del lugar de dnde se surte la receta y alguna farmacias pueden ofrecer precios ms baratos.  El sitio web www.goodrx.com tiene cupones para medicamentos de diferentes farmacias. Los precios aqu no tienen en cuenta lo que podra costar con la ayuda del seguro (puede ser ms barato con su seguro), pero el sitio web puede darle el precio si no utiliz ningn seguro.  - Puede imprimir el cupn correspondiente y llevarlo con su receta a la farmacia.  - Tambin puede pasar por nuestra oficina durante el horario de atencin regular y recoger una tarjeta de cupones de GoodRx.  -   Si necesita que su receta se enve electrnicamente a una farmacia diferente, informe a nuestra oficina a travs de MyChart de [place] o por telfono llamando al [phone] y presione la opcin 4.  

## 2022-09-25 ENCOUNTER — Encounter: Payer: Self-pay | Admitting: Dermatology

## 2022-09-30 DIAGNOSIS — M48061 Spinal stenosis, lumbar region without neurogenic claudication: Secondary | ICD-10-CM | POA: Diagnosis not present

## 2022-09-30 DIAGNOSIS — E039 Hypothyroidism, unspecified: Secondary | ICD-10-CM | POA: Diagnosis not present

## 2022-09-30 DIAGNOSIS — N183 Chronic kidney disease, stage 3 unspecified: Secondary | ICD-10-CM | POA: Diagnosis not present

## 2022-09-30 DIAGNOSIS — I482 Chronic atrial fibrillation, unspecified: Secondary | ICD-10-CM | POA: Diagnosis not present

## 2022-09-30 DIAGNOSIS — I129 Hypertensive chronic kidney disease with stage 1 through stage 4 chronic kidney disease, or unspecified chronic kidney disease: Secondary | ICD-10-CM | POA: Diagnosis not present

## 2022-09-30 DIAGNOSIS — I2510 Atherosclerotic heart disease of native coronary artery without angina pectoris: Secondary | ICD-10-CM | POA: Diagnosis not present

## 2022-10-15 DIAGNOSIS — I129 Hypertensive chronic kidney disease with stage 1 through stage 4 chronic kidney disease, or unspecified chronic kidney disease: Secondary | ICD-10-CM | POA: Diagnosis not present

## 2022-10-15 DIAGNOSIS — I2510 Atherosclerotic heart disease of native coronary artery without angina pectoris: Secondary | ICD-10-CM | POA: Diagnosis not present

## 2022-10-15 DIAGNOSIS — E039 Hypothyroidism, unspecified: Secondary | ICD-10-CM | POA: Diagnosis not present

## 2022-10-15 DIAGNOSIS — I482 Chronic atrial fibrillation, unspecified: Secondary | ICD-10-CM | POA: Diagnosis not present

## 2022-10-15 DIAGNOSIS — Z7901 Long term (current) use of anticoagulants: Secondary | ICD-10-CM | POA: Diagnosis not present

## 2022-10-15 DIAGNOSIS — N183 Chronic kidney disease, stage 3 unspecified: Secondary | ICD-10-CM | POA: Diagnosis not present

## 2022-11-12 DIAGNOSIS — Z7901 Long term (current) use of anticoagulants: Secondary | ICD-10-CM | POA: Diagnosis not present

## 2022-11-12 DIAGNOSIS — I482 Chronic atrial fibrillation, unspecified: Secondary | ICD-10-CM | POA: Diagnosis not present

## 2022-11-18 ENCOUNTER — Ambulatory Visit: Payer: PPO | Admitting: Urology

## 2022-11-18 VITALS — BP 154/66 | HR 65 | Ht 70.0 in | Wt 182.1 lb

## 2022-11-18 DIAGNOSIS — C678 Malignant neoplasm of overlapping sites of bladder: Secondary | ICD-10-CM

## 2022-11-18 DIAGNOSIS — N3289 Other specified disorders of bladder: Secondary | ICD-10-CM

## 2022-11-18 DIAGNOSIS — Z8551 Personal history of malignant neoplasm of bladder: Secondary | ICD-10-CM | POA: Diagnosis not present

## 2022-11-18 LAB — URINALYSIS, COMPLETE
Bilirubin, UA: NEGATIVE
Glucose, UA: NEGATIVE
Ketones, UA: NEGATIVE
Leukocytes,UA: NEGATIVE
Nitrite, UA: NEGATIVE
Protein,UA: NEGATIVE
Specific Gravity, UA: 1.025 (ref 1.005–1.030)
Urobilinogen, Ur: 1 mg/dL (ref 0.2–1.0)
pH, UA: 6.5 (ref 5.0–7.5)

## 2022-11-18 LAB — MICROSCOPIC EXAMINATION

## 2022-11-18 NOTE — Progress Notes (Signed)
   11/18/22  CC:  Chief Complaint  Patient presents with   Cysto    HPI:  89 y.o. male with a personal history of bladder cancer, who presents today for a surveillance cystoscopy.    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  Received intravesical gemcitabine post op.     He underwent fulguration of a very small possible early recurrence which was a 2 mm area adjacent to the left bladder neck at the inferior pole of a stellate scar on the lateral wall on 7/22.   Elected to abstain from BCG.   He had a complex multifocal recurrence and underwent an office fulguration on 01/08/2022 which was well-tolerated.  A follow-up cystoscopy showed no obvious residual disease.  NED. A&Ox3.   No respiratory distress   Abd soft, NT, ND Normal phallus with bilateral descended testicles  Cystoscopy Procedure Note  Patient identification was confirmed, informed consent was obtained, and patient was prepped using Betadine solution.  Lidocaine jelly was administered per urethral meatus.     Pre-Procedure: - Inspection reveals a normal caliber ureteral meatus.  Procedure: The flexible cystoscope was introduced without difficulty -Normal urethra which is patent -Prostate with bilobar coaptation -Mildly trabeculated bladder -Small papillary approximately 5 mm tumor just lateral to stellate scar on left lateral bladder wall consistent with low-grade recurrence  -Retroflexion shows normal bladder neck with intravesical prostate protrusion    Post-Procedure: - Patient tolerated the procedure well  Assessment/ Plan:  1. Malignant neoplasm of overlapping sites of bladder (HCC) Low-grade papillary recurrence in left lateral bladder wall  Given his age and comorbidities as well as previous tolerated in office fulguration, strongly recommend fulguration in the office.  Risks / benefits reviewed.  Both he and his son are agreeable to this plan.  Return for  office  fulguration of left lateral bladder wall tumor recurrence  Vanna Scotland, MD

## 2022-11-23 DIAGNOSIS — H43813 Vitreous degeneration, bilateral: Secondary | ICD-10-CM | POA: Diagnosis not present

## 2022-12-09 DIAGNOSIS — I482 Chronic atrial fibrillation, unspecified: Secondary | ICD-10-CM | POA: Diagnosis not present

## 2022-12-09 DIAGNOSIS — Z7901 Long term (current) use of anticoagulants: Secondary | ICD-10-CM | POA: Diagnosis not present

## 2022-12-12 DIAGNOSIS — U071 COVID-19: Secondary | ICD-10-CM | POA: Diagnosis not present

## 2022-12-12 DIAGNOSIS — J069 Acute upper respiratory infection, unspecified: Secondary | ICD-10-CM | POA: Diagnosis not present

## 2022-12-12 DIAGNOSIS — Z20822 Contact with and (suspected) exposure to covid-19: Secondary | ICD-10-CM | POA: Diagnosis not present

## 2022-12-16 ENCOUNTER — Ambulatory Visit: Payer: PPO | Admitting: Urology

## 2022-12-16 VITALS — BP 143/64 | HR 56

## 2022-12-16 DIAGNOSIS — D494 Neoplasm of unspecified behavior of bladder: Secondary | ICD-10-CM | POA: Diagnosis not present

## 2022-12-16 DIAGNOSIS — C679 Malignant neoplasm of bladder, unspecified: Secondary | ICD-10-CM | POA: Diagnosis not present

## 2022-12-16 DIAGNOSIS — Z8551 Personal history of malignant neoplasm of bladder: Secondary | ICD-10-CM | POA: Diagnosis not present

## 2022-12-16 MED ORDER — CEPHALEXIN 250 MG PO CAPS
500.0000 mg | ORAL_CAPSULE | Freq: Once | ORAL | Status: AC
Start: 2022-12-16 — End: 2022-12-16
  Administered 2022-12-16: 500 mg via ORAL

## 2022-12-16 NOTE — Progress Notes (Unsigned)
   12/16/22  CC:  Chief Complaint  Patient presents with   Cysto    fulguration    HPI:  89 y.o. male with a personal history of bladder cancer, who presents today for a surveillance cystoscopy.    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  Received intravesical gemcitabine post op.     He underwent fulguration of a very small possible early recurrence which was a 2 mm area adjacent to the left bladder neck at the inferior pole of a stellate scar on the lateral wall on 7/22.   Elected to abstain from BCG.   He had a complex multifocal recurrence and underwent an office fulguration on 01/08/2022 which was well-tolerated.  A follow-up cystoscopy showed no obvious residual disease.  New recurrence noted on last cysto- 5 mm tumor just lateral to stellate scar on left lateral bladder wall consistent with low-grade recurrence  NED. A&Ox3.   No respiratory distress   Abd soft, NT, ND Normal phallus with bilateral descended testicles  Cystoscopy Procedure Note  Preoprocedure instillation of intravesical lidocaine x 30 min (see CMA note)  Patient identification was confirmed, informed consent was obtained, and patient was prepped using Betadine solution.  Lidocaine jelly was administered per urethral meatus.     Pre-Procedure: - Inspection reveals a normal caliber ureteral meatus.  Procedure: The flexible cystoscope was introduced without difficulty -Normal urethra which is patent -Prostate with bilobar coaptation -Mildly trabeculated bladder -Small papillary approximately 8 mm tumor (enlarged since original/ previous cysto) just lateral to stellate scar on left lateral bladder wall consistent with low-grade recurrence.  3 other small areas adjacent to each of his resection bed were also fulgurated which are relatively flat but texturized concerning for early recurrence.  -Retroflexion shows normal bladder neck with intravesical prostate protrusion     Post-Procedure: - Patient tolerated the procedure well  Assessment/ Plan:  1. Malignant neoplasm of overlapping sites of bladder (HCC) Low-grade papillary recurrence in left lateral bladder wall s/p office fulguration  Well tolerated without visible residual tumor  Plan for cysto in 3 mo  Vanna Scotland, MD    Vanna Scotland, MD

## 2022-12-16 NOTE — Progress Notes (Unsigned)
Bladder Instillation  Due to Fulguration patient is present today for a Bladder Instillation of Lidocaine 2%. Patient was cleaned and prepped in a sterile fashion with betadine and lidocaine 2% jelly was instilled into the urethra.  A 14 FR catheter was inserted, urine return was noted 0 ml. 60 ml was instilled into the bladder. The catheter was then removed. Patient tolerated well, no complications were noted. Patient held in bladder for 30 minutes prior to procedure starting.   Performed by: Jenessa Gillingham H RMA  Follow up/ Additional notes: 4 months f/u

## 2022-12-17 LAB — URINALYSIS, COMPLETE
Bilirubin, UA: NEGATIVE
Glucose, UA: NEGATIVE
Ketones, UA: NEGATIVE
Leukocytes,UA: NEGATIVE
Nitrite, UA: NEGATIVE
RBC, UA: NEGATIVE
Specific Gravity, UA: 1.025 (ref 1.005–1.030)
Urobilinogen, Ur: >8 mg/dL — ABNORMAL HIGH (ref 0.2–1.0)
pH, UA: 6.5 (ref 5.0–7.5)

## 2022-12-17 LAB — MICROSCOPIC EXAMINATION

## 2022-12-20 ENCOUNTER — Emergency Department
Admission: EM | Admit: 2022-12-20 | Discharge: 2022-12-20 | Disposition: A | Discharge status: Home / Self Care | Payer: PPO | Attending: Emergency Medicine | Admitting: Emergency Medicine

## 2022-12-20 ENCOUNTER — Emergency Department: Payer: PPO

## 2022-12-20 ENCOUNTER — Other Ambulatory Visit: Payer: Self-pay

## 2022-12-20 DIAGNOSIS — I517 Cardiomegaly: Secondary | ICD-10-CM | POA: Diagnosis not present

## 2022-12-20 DIAGNOSIS — R9389 Abnormal findings on diagnostic imaging of other specified body structures: Secondary | ICD-10-CM | POA: Diagnosis not present

## 2022-12-20 DIAGNOSIS — E039 Hypothyroidism, unspecified: Secondary | ICD-10-CM | POA: Diagnosis not present

## 2022-12-20 DIAGNOSIS — Z8616 Personal history of COVID-19: Secondary | ICD-10-CM

## 2022-12-20 DIAGNOSIS — I10 Essential (primary) hypertension: Secondary | ICD-10-CM | POA: Diagnosis not present

## 2022-12-20 DIAGNOSIS — R918 Other nonspecific abnormal finding of lung field: Secondary | ICD-10-CM | POA: Diagnosis not present

## 2022-12-20 DIAGNOSIS — R600 Localized edema: Secondary | ICD-10-CM | POA: Insufficient documentation

## 2022-12-20 DIAGNOSIS — J209 Acute bronchitis, unspecified: Secondary | ICD-10-CM | POA: Insufficient documentation

## 2022-12-20 DIAGNOSIS — Z8551 Personal history of malignant neoplasm of bladder: Secondary | ICD-10-CM | POA: Insufficient documentation

## 2022-12-20 DIAGNOSIS — R059 Cough, unspecified: Secondary | ICD-10-CM | POA: Diagnosis not present

## 2022-12-20 DIAGNOSIS — R0602 Shortness of breath: Secondary | ICD-10-CM | POA: Diagnosis not present

## 2022-12-20 HISTORY — DX: Personal history of COVID-19: Z86.16

## 2022-12-20 LAB — CBC
HCT: 41.3 % (ref 39.0–52.0)
Hemoglobin: 14.2 g/dL (ref 13.0–17.0)
MCH: 32.1 pg (ref 26.0–34.0)
MCHC: 34.4 g/dL (ref 30.0–36.0)
MCV: 93.2 fL (ref 80.0–100.0)
Platelets: 194 10*3/uL (ref 150–400)
RBC: 4.43 MIL/uL (ref 4.22–5.81)
RDW: 12.7 % (ref 11.5–15.5)
WBC: 10.5 10*3/uL (ref 4.0–10.5)
nRBC: 0 % (ref 0.0–0.2)

## 2022-12-20 LAB — TROPONIN I (HIGH SENSITIVITY): Troponin I (High Sensitivity): 12 ng/L (ref <18)

## 2022-12-20 LAB — BRAIN NATRIURETIC PEPTIDE: B Natriuretic Peptide: 150.5 pg/mL — ABNORMAL HIGH (ref 0.0–100.0)

## 2022-12-20 LAB — BASIC METABOLIC PANEL
Anion gap: 12 (ref 5–15)
BUN: 21 mg/dL (ref 8–23)
CO2: 22 mmol/L (ref 22–32)
Calcium: 8.6 mg/dL — ABNORMAL LOW (ref 8.9–10.3)
Chloride: 99 mmol/L (ref 98–111)
Creatinine, Ser: 0.86 mg/dL (ref 0.61–1.24)
GFR, Estimated: >60 mL/min (ref >60)
Glucose, Bld: 143 mg/dL — ABNORMAL HIGH (ref 70–99)
Potassium: 3.7 mmol/L (ref 3.5–5.1)
Sodium: 133 mmol/L — ABNORMAL LOW (ref 135–145)

## 2022-12-20 MED ORDER — ALBUTEROL SULFATE (2.5 MG/3ML) 0.083% IN NEBU
2.5000 mg | INHALATION_SOLUTION | Freq: Once | RESPIRATORY_TRACT | Status: AC
  Administered 2022-12-20: 2.5 mg via RESPIRATORY_TRACT
  Filled 2022-12-20: qty 3

## 2022-12-20 MED ORDER — GUAIFENESIN 200 MG PO TABS: 400.0000 mg | ORAL_TABLET | Freq: Four times a day (QID) | ORAL | 0 refills | Status: DC | PRN

## 2022-12-20 MED ORDER — GUAIFENESIN 100 MG/5ML PO LIQD
15.0000 mL | Freq: Once | ORAL | Status: AC
  Administered 2022-12-20: 15 mL via ORAL
  Filled 2022-12-20: qty 20

## 2022-12-20 MED ORDER — PREDNISONE 20 MG PO TABS
40.0000 mg | ORAL_TABLET | Freq: Once | ORAL | Status: AC
  Administered 2022-12-20: 40 mg via ORAL
  Filled 2022-12-20: qty 2

## 2022-12-20 MED ORDER — PREDNISONE 50 MG PO TABS: 50.0000 mg | ORAL_TABLET | Freq: Every day | ORAL | 0 refills | Status: AC

## 2022-12-20 MED ORDER — AZITHROMYCIN 250 MG PO TABS: ORAL_TABLET | ORAL | 0 refills | Status: AC

## 2022-12-20 MED ORDER — ALBUTEROL SULFATE HFA 108 (90 BASE) MCG/ACT IN AERS: 2.0000 | INHALATION_SPRAY | RESPIRATORY_TRACT | 0 refills | Status: AC | PRN

## 2022-12-20 NOTE — ED Triage Notes (Signed)
Pt to ed from home via POV with son-in-law for a cough since 9/28. He was seen at his PCP for same on 9/28 for COVID and pneumonia. He was placed on on abx for same. Pt doesn't feel like he has gotten any better. Pt is caox4, in no acute distress in triage. Per tech pt was 92% on RA so she placed him on [place] at 2 liters. Pt denies HX of COPD or emphysema.

## 2022-12-20 NOTE — ED Provider Notes (Signed)
[facility] Provider Note    Event Date/Time   First MD Initiated Contact with Patient 12/20/22 2027     (approximate)   History   Cough (Since 9/28)   HPI  Carl Coffey is a 89 y.o. male with a history of atrial fibrillation, hypertension, hypothyroidism, and bladder cancer who presents with increasing nonproductive cough today associated with some shortness of breath.  The patient was diagnosed with COVID last week and completed a course of an antiviral.  He states that he generally has been feeling better over the last several days, until worsening today.  He denies fever or chills.  He has no chest or back pain.  He denies any acute leg swelling.  He has no vomiting or diarrhea.  I reviewed the past medical records.  The patient has no recent hospitalizations or ED visits.  His most recent outpatient encounter was with urology for surveillance cystoscopy on 10/2.  He was diagnosed with COVID on 9/28 after presenting with cough and congestion.   Physical Exam   Triage Vital Signs: ED Triage Vitals  Encounter Vitals Group     BP 12/20/22 2002 124/64     Systolic BP Percentile --      Diastolic BP Percentile --      Pulse Rate 12/20/22 2002 69     Resp 12/20/22 2002 16     Temp 12/20/22 2002 98.7 F (37.1 C)     Temp Source 12/20/22 2002 Oral     SpO2 12/20/22 2002 92 %     Weight 12/20/22 2018 180 lb (81.6 kg)     Height 12/20/22 2018 5\' 10"  (1.778 m)     Head Circumference --      Peak Flow --      Pain Score 12/20/22 2018 0     Pain Loc --      Pain Education --      Exclude from Growth Chart --     Most recent vital signs: Vitals:   12/20/22 2200 12/20/22 2230  BP: 139/71 127/66  Pulse: 66 63  Resp: (!) 22 18  Temp:    SpO2: 99% 95%     General: Alert, well-appearing for age, no distress.  CV:  Good peripheral perfusion.  Resp:  Normal effort.  Diminished breath sounds bilaterally with no wheezes or rales. Abd:  No distention.   Other:  Trace bilateral lower extremity edema.   ED Results / Procedures / Treatments   Labs (all labs ordered are listed, but only abnormal results are displayed) Labs Reviewed  BASIC METABOLIC PANEL - Abnormal; Notable for the following components:      Result Value   Sodium 133 (*)    Glucose, Bld 143 (*)    Calcium 8.6 (*)    All other components within normal limits  BRAIN NATRIURETIC PEPTIDE - Abnormal; Notable for the following components:   B Natriuretic Peptide 150.5 (*)    All other components within normal limits  CBC  TROPONIN I (HIGH SENSITIVITY)     EKG  ED ECG REPORT I, Dionne Bucy, the attending physician, personally viewed and interpreted this ECG.  Date: 12/20/2022 EKG Time: 2030 Rate: 16 Rhythm: 89 year old fibrillation QRS Axis: normal Intervals: RBBB, LAFB ST/T Wave abnormalities: normal Narrative Interpretation: no evidence of acute ischemia    RADIOLOGY  Chest x-ray: I independently viewed and interpreted the images; there is some faint interstitial opacity but no focal consolidation or edema  PROCEDURES:  Critical Care performed: No  Procedures   MEDICATIONS ORDERED IN ED: Medications  albuterol (PROVENTIL) (2.5 MG/3ML) 0.083% nebulizer solution 2.5 mg (2.5 mg Nebulization Given 12/20/22 2145)  albuterol (PROVENTIL) (2.5 MG/3ML) 0.083% nebulizer solution 2.5 mg (2.5 mg Nebulization Given 12/20/22 2145)  predniSONE (DELTASONE) tablet 40 mg (40 mg Oral Given 12/20/22 2139)  guaiFENesin (ROBITUSSIN) 100 MG/5ML liquid 15 mL (15 mLs Oral Given 12/20/22 2139)     IMPRESSION / MDM / ASSESSMENT AND PLAN / ED COURSE  I reviewed the triage vital signs and the nursing notes.  89 year old male with PMH as noted above presents with increased cough and shortness of breath today after recently being treated for COVID with an antiviral.  On exam, the patient had an O2 saturation in the low 90s on room air.  He is now on 2 L.  Breath sounds are  diminished bilaterally but the patient is able to speak in full sentences and has no respiratory distress.  There is no significant edema.  Differential diagnosis includes, but is not limited to, acute bronchitis, pneumonia, COVID rebound.  There is no clinical evidence for CHF or other cardiac etiology.  We will obtain chest x-ray, labs, give bronchodilators, steroid, and reassess.  Patient's presentation is most consistent with acute complicated illness / injury requiring diagnostic workup.  The patient is on the cardiac monitor to evaluate for evidence of arrhythmia and/or significant heart rate changes.  ----------------------------------------- 11:11 PM on 12/20/2022 -----------------------------------------  Chest x-ray shows faint interstitial opacity but no focal consolidation or edema.  BNP is not significantly elevated.  Troponin is negative.  On reassessment, the patient states he is feeling significantly better after albuterol and steroid.  I turned off the supplemental oxygen, and his O2 saturation remains between 91 and 97 on room air.  He has no increased work of breathing or respiratory distress.  I did consider whether the patient may benefit from inpatient admission given his age and the need for oxygen earlier, however he expresses a strong preference to go home which I feel is reasonable given that he does not need oxygen at this time and has a reassuring workup.  I counseled the patient and family members on the results of the workup and plan of care.  Overall presentation is consistent with either bronchitis or atypical pneumonia.  I have prescribed azithromycin, albuterol, prednisone, and guaifenesin.  I gave strict return precautions and the patient and family expressed understanding.   FINAL CLINICAL IMPRESSION(S) / ED DIAGNOSES   Final diagnoses:  Acute bronchitis, unspecified organism     Rx / DC Orders   ED Discharge Orders          Ordered    predniSONE  (DELTASONE) 50 MG tablet  Daily        12/20/22 2310    albuterol (VENTOLIN HFA) 108 (90 Base) MCG/ACT inhaler  Every 4 hours PRN        12/20/22 2310    guaiFENesin 200 MG tablet  Every 6 hours PRN        12/20/22 2310    azithromycin (ZITHROMAX Z-PAK) 250 MG tablet        12/20/22 2310             Note:  This document was prepared using Dragon voice recognition software and may include unintentional dictation errors.    Dionne Bucy, MD 12/20/22 [phone]

## 2022-12-20 NOTE — Discharge Instructions (Addendum)
Take the azithromycin as prescribed and finish the full 5-day course.  Take the whole course of prednisone as well.  Use the albuterol as needed up to every 4 hours.  You may use the guaifenesin as needed for cough.  Return to the ER for new, worsening, or persistent severe shortness of breath, cough, fever, weakness, chest pain, oxygen levels in the 80s, or any other new or worsening symptoms that concern you.

## 2022-12-20 NOTE — ED Notes (Signed)
When this EDT checked Pts vitals during triage pts oxygen was at 92% and not improving. This EDT notified first nurse and she instructed this EDT to put this pt on 2 liters of O2. Pts oxygen improved to 95% with 2 liters of oxygen.

## 2022-12-28 DIAGNOSIS — U071 COVID-19: Secondary | ICD-10-CM | POA: Diagnosis not present

## 2022-12-28 DIAGNOSIS — I482 Chronic atrial fibrillation, unspecified: Secondary | ICD-10-CM | POA: Diagnosis not present

## 2023-01-25 DIAGNOSIS — I482 Chronic atrial fibrillation, unspecified: Secondary | ICD-10-CM | POA: Diagnosis not present

## 2023-01-25 DIAGNOSIS — Z7901 Long term (current) use of anticoagulants: Secondary | ICD-10-CM | POA: Diagnosis not present

## 2023-01-31 NOTE — Progress Notes (Unsigned)
Cardiology Office Note  Date:  02/01/2023   ID:  Carl Coffey, DOB June 13, 1921, MRN [id number]  PCP:  Lauro Regulus, MD   Chief Complaint  Patient presents with   6 month follow up     "Doing well." Medications reviewed by the patient verbally.     HPI:  [name] is a 89 year old gentleman with past medical history of Permanent atrial fibrillation treated with rate control and anticoagulation Coronary artery disease, history of CABG x3 April 96 Essential hypertension Hyperlipidemia Peripheral edema Previously followed by Dr. Lady Gary last seen March 2023 Who presents to establish care for his coronary disease and permanent atrial fibrillation  Last seen by myself in clinic May 2024 Seen in the emergency room December 20, 2022,  diagnosed with COVID 1 week earlier December 12, 2022 Symptoms of cough and congestion in the ER  Lab work reviewed A1c 5.9 Total cholesterol 120, LDL 50 TSH 1.65 Normal BMP, CBC, troponin  A-fib dating back many years, detailed on EKG March 2017 Tolerating warfarin Not on medications for rate control  Spends his time helping to manage his house, take care of his wife who has dementia  Blood pressure low on today's visit, denies orthostasis symptoms  Denies chest pain concerning for angina, trivial ankle  edema Takes Lasix 20 mg every other day  EKG personally reviewed by myself on todays visit EKG Interpretation Date/Time:  Monday February 01 2023 13:57:44 EST Ventricular Rate:  57 PR Interval:    QRS Duration:  152 QT Interval:  468 QTC Calculation: 455 R Axis:   -72  Text Interpretation: Atrial fibrillation with slow ventricular response Left axis deviation Right bundle branch block When compared with ECG of 20-Dec-2022 20:30, No significant change was found Confirmed by Julien Nordmann [phone]) on 02/01/2023 2:02:05 PM    PMH:   has a past medical history of A-fib (HCC), Basal cell carcinoma (10/13/2017), Basal cell carcinoma  (10/09/2020), Basal cell carcinoma (10/08/2021), Basal cell carcinoma (04/13/2022), Coronary artery disease, Dysplastic nevus (02/10/2016), Hypertension, Hypothyroidism, Myocardial infarction (HCC), Squamous cell carcinoma of skin (07/26/2007), and Thyroid disease.  PSH:    Past Surgical History:  Procedure Laterality Date   CARDIAC SURGERY  1995   CABG x 3   CATARACT EXTRACTION, BILATERAL     CORONARY ARTERY BYPASS GRAFT  1995   triple   CYSTOSCOPY W/ RETROGRADES Bilateral 02/20/2019   Procedure: CYSTOSCOPY WITH RETROGRADE PYELOGRAM;  Surgeon: Vanna Scotland, MD;  Location: [facility] ORS;  Service: Urology;  Laterality: Bilateral;   HERNIA REPAIR Bilateral    MEATOTOMY N/A 02/20/2019   Procedure: MEATOTOMY ADULT;  Surgeon: Vanna Scotland, MD;  Location: [facility] ORS;  Service: Urology;  Laterality: N/A;   TRANSURETHRAL RESECTION OF BLADDER TUMOR WITH MITOMYCIN-C N/A 02/20/2019   Procedure: TRANSURETHRAL RESECTION OF BLADDER TUMOR WITH Gemcitabine;  Surgeon: Vanna Scotland, MD;  Location: [facility] ORS;  Service: Urology;  Laterality: N/A;    Current Outpatient Medications  Medication Sig Dispense Refill   albuterol (VENTOLIN HFA) 108 (90 Base) MCG/ACT inhaler Inhale 2 puffs into the lungs every 4 (four) hours as needed. 8 g 0   aspirin EC 81 MG tablet Take 81 mg by mouth daily.     finasteride (PROSCAR) 5 MG tablet Take 5 mg by mouth daily.      furosemide (LASIX) 20 MG tablet Take 1 tablet by mouth every other day.     gabapentin (NEURONTIN) 100 MG capsule Take 100 mg by mouth at bedtime.  guaiFENesin 200 MG tablet Take 2 tablets (400 mg total) by mouth every 6 (six) hours as needed for cough or to loosen phlegm. 30 tablet 0   ketoconazole (NIZORAL) 2 % shampoo APPLY 1 APPLICATION TOPICALLY 3 TIMES A WEEK, SHAMPOO SCALP 3 TIMES A WEEK, LET SIT FOR 5 MINUTES BEFORE RINSING OUT 120 mL 6   levothyroxine (SYNTHROID, LEVOTHROID) 75 MCG tablet Take 75 mcg by mouth daily before breakfast.       simvastatin (ZOCOR) 10 MG tablet Take 10 mg by mouth at bedtime.     warfarin (COUMADIN) 2.5 MG tablet Take 2.5 mg by mouth daily.      Current Facility-Administered Medications  Medication Dose Route Frequency Provider Last Rate Last Admin   gemcitabine (GEMZAR) 2,000 mg in sodium chloride irrigation 0.9 % chemo infusion  2,000 mg Irrigation Once Vanna Scotland, MD       gemcitabine (GEMZAR) 2,000 mg in sodium chloride irrigation 0.9 % chemo infusion  2,000 mg Irrigation Once Vanna Scotland, MD        Allergies:   Flexeril [cyclobenzaprine]   Social History:  The patient  reports that he has never smoked. He has never been exposed to tobacco smoke. He has never used smokeless tobacco. He reports that he does not drink alcohol and does not use drugs.   Family History:   family history is not on file.    Review of Systems: Review of Systems  Constitutional: Negative.   HENT: Negative.    Respiratory: Negative.    Cardiovascular: Negative.   Gastrointestinal: Negative.   Musculoskeletal: Negative.   Neurological: Negative.   Psychiatric/Behavioral: Negative.    All other systems reviewed and are negative.   PHYSICAL EXAM: VS:  BP (!) 100/54 (BP Location: Left Arm, Patient Position: Sitting, Cuff Size: Normal)   Pulse (!) 57   Ht 5\' 9"  (1.753 m)   Wt 183 lb 4 oz (83.1 kg)   SpO2 98%   BMI 27.06 kg/m  , BMI Body mass index is 27.06 kg/m. Constitutional:  oriented to person, place, and time. No distress.  HENT:  Head: Grossly normal Eyes:  no discharge. No scleral icterus.  Neck: No JVD, no carotid bruits  Cardiovascular: Regular rate and rhythm, no murmurs appreciated Pulmonary/Chest: Clear to auscultation bilaterally, no wheezes or rails Abdominal: Soft.  no distension.  no tenderness.  Musculoskeletal: Normal range of motion Neurological:  normal muscle tone. Coordination normal. No atrophy Skin: Skin warm and dry Psychiatric: normal affect, pleasant   Recent  Labs: 12/20/2022: B Natriuretic Peptide 150.5; BUN 21; Creatinine, Ser 0.86; Hemoglobin 14.2; Platelets 194; Potassium 3.7; Sodium 133    Lipid Panel No results found for: "CHOL", "HDL", "LDLCALC", "TRIG"    Wt Readings from Last 3 Encounters:  02/01/23 183 lb 4 oz (83.1 kg)  12/20/22 180 lb (81.6 kg)  11/18/22 182 lb 1 oz (82.6 kg)     ASSESSMENT AND PLAN:  Problem List Items Addressed This Visit       Cardiology Problems   Chronic atrial fibrillation (HCC) - Primary   Relevant Orders   EKG 12-Lead (Completed)   Coronary atherosclerosis   Relevant Orders   EKG 12-Lead (Completed)   Hypertension, benign   Relevant Orders   EKG 12-Lead (Completed)   Hyperlipidemia    Coronary artery disease with stable angina, history of CABG Currently with no symptoms of angina. No further workup at this time. Continue current medication regimen.  Hyperlipidemia Cholesterol is at goal on the  current lipid regimen. No changes to the medications were made.  Permanent atrial fibrillation Metoprolol previously held for hypotension Tolerating warfarin, managed through primary care Asymptomatic  Essential hypertension Blood pressure running low, recommend we hold amlodipine  Ankle swelling On Lasix 20 every other day Recommended extra Lasix as needed for worsening ankle swelling    Signed, Dossie Arbour, M.D., Ph.D. [facility] [place], [place] [phone]

## 2023-02-01 ENCOUNTER — Ambulatory Visit: Payer: PPO | Attending: Cardiovascular Disease | Admitting: Cardiovascular Disease

## 2023-02-01 ENCOUNTER — Encounter: Payer: Self-pay | Admitting: Cardiovascular Disease

## 2023-02-01 VITALS — BP 100/54 | HR 57 | Ht 69.0 in | Wt 183.2 lb

## 2023-02-01 DIAGNOSIS — E782 Mixed hyperlipidemia: Secondary | ICD-10-CM

## 2023-02-01 DIAGNOSIS — I10 Essential (primary) hypertension: Secondary | ICD-10-CM | POA: Diagnosis not present

## 2023-02-01 DIAGNOSIS — I25118 Atherosclerotic heart disease of native coronary artery with other forms of angina pectoris: Secondary | ICD-10-CM

## 2023-02-01 DIAGNOSIS — I482 Chronic atrial fibrillation, unspecified: Secondary | ICD-10-CM

## 2023-02-01 NOTE — Patient Instructions (Addendum)
Medication Instructions:  Please stop the amlodipine  If you need a refill on your cardiac medications before your next appointment, please call your pharmacy.   Lab work: No new labs needed  Testing/Procedures: No new testing needed  Follow-Up: At [facility], you and your health needs are our priority.  As part of our continuing mission to provide you with exceptional heart care, we have created designated Provider Care Teams.  These Care Teams include your primary Cardiologist (physician) and Advanced Practice Providers (APPs -  Physician Assistants and Nurse Practitioners) who all work together to provide you with the care you need, when you need it.  You will need a follow up appointment in 12 months  Providers on your designated Care Team:   Nicolasa Ducking, NP Eula Listen, PA-C Cadence Fransico Michael, [place]  COVID-19 Vaccine Information can be found at: [email or web address] For questions related to vaccine distribution or appointments, please email [email or web address] or call [phone].

## 2023-02-24 DIAGNOSIS — Z7901 Long term (current) use of anticoagulants: Secondary | ICD-10-CM | POA: Diagnosis not present

## 2023-02-24 DIAGNOSIS — I482 Chronic atrial fibrillation, unspecified: Secondary | ICD-10-CM | POA: Diagnosis not present

## 2023-03-22 ENCOUNTER — Ambulatory Visit: Payer: PPO | Admitting: Dermatology

## 2023-03-22 ENCOUNTER — Encounter: Payer: Self-pay | Admitting: Dermatology

## 2023-03-22 DIAGNOSIS — L814 Other melanin hyperpigmentation: Secondary | ICD-10-CM

## 2023-03-22 DIAGNOSIS — L821 Other seborrheic keratosis: Secondary | ICD-10-CM | POA: Diagnosis not present

## 2023-03-22 DIAGNOSIS — D692 Other nonthrombocytopenic purpura: Secondary | ICD-10-CM

## 2023-03-22 DIAGNOSIS — L578 Other skin changes due to chronic exposure to nonionizing radiation: Secondary | ICD-10-CM | POA: Diagnosis not present

## 2023-03-22 DIAGNOSIS — W908XXA Exposure to other nonionizing radiation, initial encounter: Secondary | ICD-10-CM

## 2023-03-22 DIAGNOSIS — Z1283 Encounter for screening for malignant neoplasm of skin: Secondary | ICD-10-CM | POA: Diagnosis not present

## 2023-03-22 DIAGNOSIS — L570 Actinic keratosis: Secondary | ICD-10-CM

## 2023-03-22 DIAGNOSIS — Z8589 Personal history of malignant neoplasm of other organs and systems: Secondary | ICD-10-CM

## 2023-03-22 DIAGNOSIS — Z872 Personal history of diseases of the skin and subcutaneous tissue: Secondary | ICD-10-CM

## 2023-03-22 DIAGNOSIS — Z85828 Personal history of other malignant neoplasm of skin: Secondary | ICD-10-CM

## 2023-03-22 DIAGNOSIS — D1801 Hemangioma of skin and subcutaneous tissue: Secondary | ICD-10-CM

## 2023-03-22 DIAGNOSIS — Z86018 Personal history of other benign neoplasm: Secondary | ICD-10-CM | POA: Diagnosis not present

## 2023-03-22 DIAGNOSIS — L820 Inflamed seborrheic keratosis: Secondary | ICD-10-CM

## 2023-03-22 DIAGNOSIS — D229 Melanocytic nevi, unspecified: Secondary | ICD-10-CM

## 2023-03-22 NOTE — Patient Instructions (Addendum)

## 2023-03-22 NOTE — Progress Notes (Signed)
 Follow-Up Visit   Subjective  Carl Coffey is a 89 y.o. male who presents for the following: Skin Cancer Screening and Upper Body Skin Exam, hx of SCC, hx of BCC, hx of Aks   The patient presents for Upper Body Skin Exam (UBSE) for skin cancer screening and mole check. The patient has spots, moles and lesions to be evaluated, some may be new or changing and the patient may have concern these could be cancer.    The following portions of the chart were reviewed this encounter and updated as appropriate: medications, allergies, medical history  Review of Systems:  No other skin or systemic complaints except as noted in HPI or Assessment and Plan.  Objective  Well appearing patient in no apparent distress; mood and affect are within normal limits.  All skin waist up examined. Relevant physical exam findings are noted in the Assessment and Plan.  back x 2 (2) Stuck-on, waxy, tan-brown papule or plaque --Discussed benign etiology and prognosis.  face x 8 , left ear x 1, hands, arm x 11 (20) Erythematous thin papules/macules with gritty scale.   Assessment & Plan   INFLAMED SEBORRHEIC KERATOSIS (2) back x 2 (2) Destruction of lesion - back x 2 (2) Complexity: simple   Destruction method: cryotherapy   Informed consent: discussed and consent obtained   Timeout:  patient name, date of birth, surgical site, and procedure verified Lesion destroyed using liquid nitrogen: Yes   Region frozen until ice ball extended beyond lesion: Yes   Outcome: patient tolerated procedure well with no complications   Post-procedure details: wound care instructions given   AK (ACTINIC KERATOSIS) (20) face x 8 , left ear x 1, hands, arm x 11 (20) Destruction of lesion - face x 8 , left ear x 1, hands, arm x 11 (20) Complexity: simple   Destruction method: cryotherapy   Informed consent: discussed and consent obtained   Timeout:  patient name, date of birth, surgical site, and procedure  verified Lesion destroyed using liquid nitrogen: Yes   Region frozen until ice ball extended beyond lesion: Yes   Outcome: patient tolerated procedure well with no complications   Post-procedure details: wound care instructions given   Skin cancer screening performed today.  Actinic Damage - Chronic condition, secondary to cumulative UV/sun exposure - diffuse scaly erythematous macules with underlying dyspigmentation - Recommend daily broad spectrum sunscreen SPF 30+ to sun-exposed areas, reapply every 2 hours as needed.  - Staying in the shade or wearing long sleeves, sun glasses (UVA+UVB protection) and wide brim hats (4-inch brim around the entire circumference of the hat) are also recommended for sun protection.  - Call for new or changing lesions.  Lentigines, Seborrheic Keratoses, Hemangiomas - Benign normal skin lesions - Benign-appearing - Call for any changes  Melanocytic Nevi - Tan-brown and/or pink-flesh-colored symmetric macules and papules - Benign appearing on exam today - Observation - Call clinic for new or changing moles - Recommend daily use of broad spectrum spf 30+ sunscreen to sun-exposed areas.   HISTORY OF DYSPLASTIC NEVUS No evidence of recurrence today Recommend regular full body skin exams Recommend daily broad spectrum sunscreen SPF 30+ to sun-exposed areas, reapply every 2 hours as needed.  Call if any new or changing lesions are noted between office visits    HISTORY OF BASAL CELL CARCINOMA OF THE SKIN - No evidence of recurrence today - Recommend regular full body skin exams - Recommend daily broad spectrum sunscreen SPF 30+ to  sun-exposed areas, reapply every 2 hours as needed.  - Call if any new or changing lesions are noted between office visits    HISTORY OF SQUAMOUS CELL CARCINOMA OF THE SKIN - No evidence of recurrence today - No lymphadenopathy - Recommend regular full body skin exams - Recommend daily broad spectrum sunscreen SPF 30+ to  sun-exposed areas, reapply every 2 hours as needed.  - Call if any new or changing lesions are noted between office visits   Purpura - Chronic; persistent and recurrent.  Treatable, but not curable. - Violaceous macules and patches - Benign - Related to trauma, age, sun damage and/or use of blood thinners, chronic use of topical and/or oral steroids - Observe - Can use OTC arnica containing moisturizer such as Dermend Bruise Formula if desired - Call for worsening or other concerns   Return in about 1 year (around 03/21/2024) for UBSE, hx of SCC, hx of BCC, hx of AKs .  Carl Coffey, CMA, am acting as scribe for Alm Rhyme, MD .   Documentation: I have reviewed the above documentation for accuracy and completeness, and I agree with the above.  Alm Rhyme, MD

## 2023-03-26 DIAGNOSIS — Z7901 Long term (current) use of anticoagulants: Secondary | ICD-10-CM | POA: Diagnosis not present

## 2023-03-26 DIAGNOSIS — I482 Chronic atrial fibrillation, unspecified: Secondary | ICD-10-CM | POA: Diagnosis not present

## 2023-03-30 ENCOUNTER — Ambulatory Visit: Payer: PPO | Admitting: Dermatology

## 2023-04-06 DIAGNOSIS — I129 Hypertensive chronic kidney disease with stage 1 through stage 4 chronic kidney disease, or unspecified chronic kidney disease: Secondary | ICD-10-CM | POA: Diagnosis not present

## 2023-04-06 DIAGNOSIS — I482 Chronic atrial fibrillation, unspecified: Secondary | ICD-10-CM | POA: Diagnosis not present

## 2023-04-06 DIAGNOSIS — R7303 Prediabetes: Secondary | ICD-10-CM | POA: Diagnosis not present

## 2023-04-06 DIAGNOSIS — N183 Chronic kidney disease, stage 3 unspecified: Secondary | ICD-10-CM | POA: Diagnosis not present

## 2023-04-06 DIAGNOSIS — E039 Hypothyroidism, unspecified: Secondary | ICD-10-CM | POA: Diagnosis not present

## 2023-04-06 DIAGNOSIS — Z0000 Encounter for general adult medical examination without abnormal findings: Secondary | ICD-10-CM | POA: Diagnosis not present

## 2023-04-06 DIAGNOSIS — I2510 Atherosclerotic heart disease of native coronary artery without angina pectoris: Secondary | ICD-10-CM | POA: Diagnosis not present

## 2023-04-06 DIAGNOSIS — C679 Malignant neoplasm of bladder, unspecified: Secondary | ICD-10-CM | POA: Diagnosis not present

## 2023-04-20 ENCOUNTER — Ambulatory Visit: Payer: PPO | Admitting: Urology

## 2023-04-20 VITALS — BP 183/86 | HR 60 | Ht 68.0 in | Wt 180.0 lb

## 2023-04-20 DIAGNOSIS — Z8551 Personal history of malignant neoplasm of bladder: Secondary | ICD-10-CM | POA: Diagnosis not present

## 2023-04-20 LAB — URINALYSIS, COMPLETE
Bilirubin, UA: NEGATIVE
Glucose, UA: NEGATIVE
Ketones, UA: NEGATIVE
Leukocytes,UA: NEGATIVE
Nitrite, UA: NEGATIVE
Protein,UA: NEGATIVE
RBC, UA: NEGATIVE
Specific Gravity, UA: 1.02 (ref 1.005–1.030)
Urobilinogen, Ur: 2 mg/dL — ABNORMAL HIGH (ref 0.2–1.0)
pH, UA: 6.5 (ref 5.0–7.5)

## 2023-04-20 LAB — MICROSCOPIC EXAMINATION: Epithelial Cells (non renal): >10 /[HPF] — AB (ref 0–10)

## 2023-04-20 NOTE — Progress Notes (Signed)
   04/20/23  CC:  Chief Complaint  Patient presents with   Cysto    HPI:  89 y.o. male with a personal history of bladder cancer, who presents today for a surveillance cystoscopy.    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  Received intravesical gemcitabine  post op.     He underwent fulguration of a very small possible early recurrence which was a 2 mm area adjacent to the left bladder neck at the inferior pole of a stellate scar on the lateral wall on 7/22.   Elected to abstain from BCG.   He had a complex multifocal recurrence and underwent an office fulguration on 01/08/2022 which was well-tolerated.  A follow-up cystoscopy showed no obvious residual disease.  New recurrence noted on last cysto- 5 mm tumor just lateral to stellate scar on left lateral bladder wall consistent with low-grade recurrence.  He tolerated office fulguration well.  He returns today for cystoscopy.  NED. A&Ox3.   No respiratory distress   Abd soft, NT, ND Normal phallus with bilateral descended testicles  Cystoscopy Procedure Note  Preoprocedure instillation of intravesical lidocaine  x 30 min (see CMA note)  Patient identification was confirmed, informed consent was obtained, and patient was prepped using Betadine solution.  Lidocaine  jelly was administered per urethral meatus.     Pre-Procedure: - Inspection reveals a normal caliber ureteral meatus.  Procedure: The flexible cystoscope was introduced without difficulty -Normal urethra which is patent -Prostate with bilobar coaptation -Mildly trabeculated bladder -Scarlike area on the left lateral bladder wall but no obvious recurrence.  There is some texture just lateral to this question possible early recurrence.  -Retroflexion shows normal bladder neck with intravesical prostate protrusion    Post-Procedure: - Patient tolerated the procedure well  Assessment/ Plan:  1. Malignant neoplasm of overlapping sites  of bladder (HCC) Low-grade papillary recurrence in left lateral bladder wall   There may be some possible very early recurrence, however given that he is 89, it may make most sense just to follow this area and intervene as needed.  He agrees with this plan.  Will have her return in 6 months to reassess or sooner if he develops any significant urinary symptoms.  Notably, it would be just after his 89th birthday.  Rosina Riis, MD    Rosina Riis, MD

## 2023-04-26 DIAGNOSIS — Z7901 Long term (current) use of anticoagulants: Secondary | ICD-10-CM | POA: Diagnosis not present

## 2023-04-26 DIAGNOSIS — I129 Hypertensive chronic kidney disease with stage 1 through stage 4 chronic kidney disease, or unspecified chronic kidney disease: Secondary | ICD-10-CM | POA: Diagnosis not present

## 2023-04-26 DIAGNOSIS — I482 Chronic atrial fibrillation, unspecified: Secondary | ICD-10-CM | POA: Diagnosis not present

## 2023-04-26 DIAGNOSIS — R7309 Other abnormal glucose: Secondary | ICD-10-CM | POA: Diagnosis not present

## 2023-04-26 DIAGNOSIS — N183 Chronic kidney disease, stage 3 unspecified: Secondary | ICD-10-CM | POA: Diagnosis not present

## 2023-04-26 DIAGNOSIS — E039 Hypothyroidism, unspecified: Secondary | ICD-10-CM | POA: Diagnosis not present

## 2023-04-26 DIAGNOSIS — E538 Deficiency of other specified B group vitamins: Secondary | ICD-10-CM | POA: Diagnosis not present

## 2023-04-26 DIAGNOSIS — I2510 Atherosclerotic heart disease of native coronary artery without angina pectoris: Secondary | ICD-10-CM | POA: Diagnosis not present

## 2023-05-24 DIAGNOSIS — Z7901 Long term (current) use of anticoagulants: Secondary | ICD-10-CM | POA: Diagnosis not present

## 2023-05-24 DIAGNOSIS — I482 Chronic atrial fibrillation, unspecified: Secondary | ICD-10-CM | POA: Diagnosis not present

## 2023-06-23 DIAGNOSIS — E538 Deficiency of other specified B group vitamins: Secondary | ICD-10-CM | POA: Diagnosis not present

## 2023-06-23 DIAGNOSIS — I482 Chronic atrial fibrillation, unspecified: Secondary | ICD-10-CM | POA: Diagnosis not present

## 2023-07-15 DIAGNOSIS — K922 Gastrointestinal hemorrhage, unspecified: Secondary | ICD-10-CM

## 2023-07-15 HISTORY — DX: Gastrointestinal hemorrhage, unspecified: K92.2

## 2023-07-21 DIAGNOSIS — Z7901 Long term (current) use of anticoagulants: Secondary | ICD-10-CM | POA: Diagnosis not present

## 2023-07-21 DIAGNOSIS — I482 Chronic atrial fibrillation, unspecified: Secondary | ICD-10-CM | POA: Diagnosis not present

## 2023-08-03 ENCOUNTER — Encounter: Payer: Self-pay | Admitting: Internal Medicine

## 2023-08-03 ENCOUNTER — Other Ambulatory Visit: Payer: Self-pay

## 2023-08-03 ENCOUNTER — Inpatient Hospital Stay
Admission: EM | Admit: 2023-08-03 | Discharge: 2023-08-05 | DRG: 813 | Disposition: A | Discharge status: Home / Self Care | Attending: Student | Admitting: Student

## 2023-08-03 DIAGNOSIS — D6832 Hemorrhagic disorder due to extrinsic circulating anticoagulants: Principal | ICD-10-CM | POA: Diagnosis present

## 2023-08-03 DIAGNOSIS — K922 Gastrointestinal hemorrhage, unspecified: Secondary | ICD-10-CM | POA: Diagnosis present

## 2023-08-03 DIAGNOSIS — K274 Chronic or unspecified peptic ulcer, site unspecified, with hemorrhage: Secondary | ICD-10-CM | POA: Diagnosis present

## 2023-08-03 DIAGNOSIS — K8020 Calculus of gallbladder without cholecystitis without obstruction: Secondary | ICD-10-CM | POA: Diagnosis not present

## 2023-08-03 DIAGNOSIS — I4891 Unspecified atrial fibrillation: Secondary | ICD-10-CM | POA: Diagnosis not present

## 2023-08-03 DIAGNOSIS — Z888 Allergy status to other drugs, medicaments and biological substances status: Secondary | ICD-10-CM | POA: Diagnosis not present

## 2023-08-03 DIAGNOSIS — Z9841 Cataract extraction status, right eye: Secondary | ICD-10-CM | POA: Diagnosis not present

## 2023-08-03 DIAGNOSIS — Z85828 Personal history of other malignant neoplasm of skin: Secondary | ICD-10-CM | POA: Diagnosis not present

## 2023-08-03 DIAGNOSIS — Z8551 Personal history of malignant neoplasm of bladder: Secondary | ICD-10-CM

## 2023-08-03 DIAGNOSIS — E039 Hypothyroidism, unspecified: Secondary | ICD-10-CM | POA: Diagnosis present

## 2023-08-03 DIAGNOSIS — K5730 Diverticulosis of large intestine without perforation or abscess without bleeding: Secondary | ICD-10-CM | POA: Diagnosis present

## 2023-08-03 DIAGNOSIS — Z7901 Long term (current) use of anticoagulants: Secondary | ICD-10-CM

## 2023-08-03 DIAGNOSIS — T45515A Adverse effect of anticoagulants, initial encounter: Secondary | ICD-10-CM | POA: Diagnosis present

## 2023-08-03 DIAGNOSIS — I10 Essential (primary) hypertension: Secondary | ICD-10-CM | POA: Diagnosis present

## 2023-08-03 DIAGNOSIS — Z9842 Cataract extraction status, left eye: Secondary | ICD-10-CM

## 2023-08-03 DIAGNOSIS — Z66 Do not resuscitate: Secondary | ICD-10-CM | POA: Diagnosis present

## 2023-08-03 DIAGNOSIS — Z79899 Other long term (current) drug therapy: Secondary | ICD-10-CM

## 2023-08-03 DIAGNOSIS — N35911 Unspecified urethral stricture, male, meatal: Secondary | ICD-10-CM

## 2023-08-03 DIAGNOSIS — K4020 Bilateral inguinal hernia, without obstruction or gangrene, not specified as recurrent: Secondary | ICD-10-CM | POA: Diagnosis not present

## 2023-08-03 DIAGNOSIS — I480 Paroxysmal atrial fibrillation: Secondary | ICD-10-CM | POA: Diagnosis present

## 2023-08-03 DIAGNOSIS — Z7982 Long term (current) use of aspirin: Secondary | ICD-10-CM

## 2023-08-03 DIAGNOSIS — N3289 Other specified disorders of bladder: Secondary | ICD-10-CM

## 2023-08-03 DIAGNOSIS — K625 Hemorrhage of anus and rectum: Secondary | ICD-10-CM | POA: Diagnosis present

## 2023-08-03 DIAGNOSIS — Z951 Presence of aortocoronary bypass graft: Secondary | ICD-10-CM | POA: Diagnosis not present

## 2023-08-03 DIAGNOSIS — I2510 Atherosclerotic heart disease of native coronary artery without angina pectoris: Secondary | ICD-10-CM | POA: Diagnosis present

## 2023-08-03 DIAGNOSIS — I252 Old myocardial infarction: Secondary | ICD-10-CM | POA: Diagnosis not present

## 2023-08-03 DIAGNOSIS — E785 Hyperlipidemia, unspecified: Secondary | ICD-10-CM | POA: Diagnosis present

## 2023-08-03 DIAGNOSIS — R310 Gross hematuria: Secondary | ICD-10-CM

## 2023-08-03 DIAGNOSIS — Q600 Renal agenesis, unilateral: Secondary | ICD-10-CM | POA: Diagnosis not present

## 2023-08-03 DIAGNOSIS — Z79890 Hormone replacement therapy (postmenopausal): Secondary | ICD-10-CM

## 2023-08-03 LAB — CBC
HCT: 44.1 % (ref 39.0–52.0)
Hemoglobin: 14.6 g/dL (ref 13.0–17.0)
MCH: 31.8 pg (ref 26.0–34.0)
MCHC: 33.1 g/dL (ref 30.0–36.0)
MCV: 96.1 fL (ref 80.0–100.0)
Platelets: 210 10*3/uL (ref 150–400)
RBC: 4.59 MIL/uL (ref 4.22–5.81)
RDW: 14.1 % (ref 11.5–15.5)
WBC: 7.7 10*3/uL (ref 4.0–10.5)
nRBC: 0 % (ref 0.0–0.2)

## 2023-08-03 LAB — COMPREHENSIVE METABOLIC PANEL WITH GFR
ALT: 16 U/L (ref 0–44)
AST: 23 U/L (ref 15–41)
Albumin: 4 g/dL (ref 3.5–5.0)
Alkaline Phosphatase: 32 U/L — ABNORMAL LOW (ref 38–126)
Anion gap: 11 (ref 5–15)
BUN: 28 mg/dL — ABNORMAL HIGH (ref 8–23)
CO2: 24 mmol/L (ref 22–32)
Calcium: 9.1 mg/dL (ref 8.9–10.3)
Chloride: 104 mmol/L (ref 98–111)
Creatinine, Ser: 0.98 mg/dL (ref 0.61–1.24)
GFR, Estimated: >60 mL/min (ref >60)
Glucose, Bld: 104 mg/dL — ABNORMAL HIGH (ref 70–99)
Potassium: 4.1 mmol/L (ref 3.5–5.1)
Sodium: 139 mmol/L (ref 135–145)
Total Bilirubin: 1.8 mg/dL — ABNORMAL HIGH (ref 0.0–1.2)
Total Protein: 6.9 g/dL (ref 6.5–8.1)

## 2023-08-03 LAB — HEMOGLOBIN AND HEMATOCRIT, BLOOD
HCT: 40.2 % (ref 39.0–52.0)
Hemoglobin: 13.7 g/dL (ref 13.0–17.0)

## 2023-08-03 LAB — TYPE AND SCREEN
ABO/RH(D): A POS
Antibody Screen: NEGATIVE

## 2023-08-03 LAB — IRON AND TIBC
Iron: 71 ug/dL (ref 45–182)
Saturation Ratios: 21 % (ref 17.9–39.5)
TIBC: 346 ug/dL (ref 250–450)
UIBC: 275 ug/dL

## 2023-08-03 LAB — PROTIME-INR
INR: 2.1 — ABNORMAL HIGH (ref 0.8–1.2)
Prothrombin Time: 24 s — ABNORMAL HIGH (ref 11.4–15.2)

## 2023-08-03 LAB — FERRITIN: Ferritin: 47 ng/mL (ref 24–336)

## 2023-08-03 MED ORDER — GUAIFENESIN ER 600 MG PO TB12: 600.0000 mg | ORAL_TABLET | Freq: Two times a day (BID) | ORAL | Status: DC | PRN

## 2023-08-03 MED ORDER — PANTOPRAZOLE SODIUM 40 MG IV SOLR
40.0000 mg | Freq: Three times a day (TID) | INTRAVENOUS | Status: DC
  Administered 2023-08-03 – 2023-08-05 (×5): 40 mg via INTRAVENOUS
  Filled 2023-08-03 (×5): qty 10

## 2023-08-03 MED ORDER — GABAPENTIN 100 MG PO CAPS
100.0000 mg | ORAL_CAPSULE | Freq: Every day | ORAL | Status: DC
  Administered 2023-08-03 – 2023-08-04 (×2): 100 mg via ORAL
  Filled 2023-08-03 (×2): qty 1

## 2023-08-03 MED ORDER — HYDRALAZINE HCL 20 MG/ML IJ SOLN
5.0000 mg | Freq: Four times a day (QID) | INTRAMUSCULAR | Status: DC | PRN
  Administered 2023-08-03: 5 mg via INTRAVENOUS
  Filled 2023-08-03: qty 1

## 2023-08-03 MED ORDER — LEVOTHYROXINE SODIUM 50 MCG PO TABS
75.0000 ug | ORAL_TABLET | Freq: Every day | ORAL | Status: DC
  Administered 2023-08-04 – 2023-08-05 (×2): 75 ug via ORAL
  Filled 2023-08-03 (×2): qty 2

## 2023-08-03 MED ORDER — PANTOPRAZOLE SODIUM 40 MG IV SOLR: 40.0000 mg | Freq: Two times a day (BID) | INTRAVENOUS | Status: DC

## 2023-08-03 MED ORDER — ALBUTEROL SULFATE (2.5 MG/3ML) 0.083% IN NEBU: 3.0000 mL | INHALATION_SOLUTION | RESPIRATORY_TRACT | Status: DC | PRN

## 2023-08-03 MED ORDER — SIMVASTATIN 20 MG PO TABS
10.0000 mg | ORAL_TABLET | Freq: Every day | ORAL | Status: DC
  Administered 2023-08-03 – 2023-08-04 (×2): 10 mg via ORAL
  Filled 2023-08-03 (×2): qty 1

## 2023-08-03 MED ORDER — PANTOPRAZOLE SODIUM 40 MG IV SOLR
40.0000 mg | INTRAVENOUS | Status: AC
  Administered 2023-08-03 (×2): 40 mg via INTRAVENOUS
  Filled 2023-08-03 (×2): qty 10

## 2023-08-03 MED ORDER — ONDANSETRON HCL 4 MG PO TABS: 4.0000 mg | ORAL_TABLET | Freq: Four times a day (QID) | ORAL | Status: DC | PRN

## 2023-08-03 MED ORDER — FINASTERIDE 5 MG PO TABS
5.0000 mg | ORAL_TABLET | Freq: Every day | ORAL | Status: DC
  Administered 2023-08-04 – 2023-08-05 (×2): 5 mg via ORAL
  Filled 2023-08-03 (×2): qty 1

## 2023-08-03 MED ORDER — ONDANSETRON HCL 4 MG/2ML IJ SOLN: 4.0000 mg | Freq: Four times a day (QID) | INTRAMUSCULAR | Status: DC | PRN

## 2023-08-03 MED ORDER — ASPIRIN 81 MG PO TBEC
81.0000 mg | DELAYED_RELEASE_TABLET | Freq: Every day | ORAL | Status: DC
  Filled 2023-08-03: qty 1

## 2023-08-03 MED ORDER — VITAMIN K1 10 MG/ML IJ SOLN
5.0000 mg | Freq: Once | INTRAMUSCULAR | Status: AC
  Administered 2023-08-03: 5 mg via SUBCUTANEOUS
  Filled 2023-08-03: qty 1

## 2023-08-03 NOTE — Plan of Care (Signed)
  Problem: Education: Goal: Knowledge of General Education information will improve Description: Including pain rating scale, medication(s)/side effects and non-pharmacologic comfort measures Outcome: Progressing   Problem: Activity: Goal: Risk for activity intolerance will decrease Outcome: Progressing   Problem: Coping: Goal: Level of anxiety will decrease Outcome: Progressing   Problem: Elimination: Goal: Will not experience complications related to bowel motility Outcome: Progressing Goal: Will not experience complications related to urinary retention Outcome: Progressing   Problem: Pain Managment: Goal: General experience of comfort will improve and/or be controlled Outcome: Progressing   Problem: Safety: Goal: Ability to remain free from injury will improve Outcome: Progressing   Problem: Skin Integrity: Goal: Risk for impaired skin integrity will decrease Outcome: Progressing

## 2023-08-03 NOTE — H&P (Signed)
 History and Physical    Carl Coffey:[id number] DOB: [birth year] DOA: 08/03/2023  PCP: Jimmy Moulding, MD (Confirm with patient/family/NH records and if not entered, this has to be entered at [facility] point of entry) Patient coming from: Home  I have personally briefly reviewed patient's old medical records in [facility]  Chief Complaint: Black tarry stool  HPI: Carl Coffey is a 89 y.o. male with medical history significant of CAD status post CABG in 1990s, PAF on Eliquis, HTN, hypothyroidism, bladder cancer, presented with repeated black tarry stool.  Patient had first black tarry stool episode 3 days ago, denied any abdominal pain nausea vomiting although he admitted occasionally " growling of stomach" but denied any feeling of epigastric pain no nauseous vomiting.  In the following 2 days yesterday and today, patient passed additional episode of black tarry stool, " grape jelly" like, but the color has been lighter compared to 3 days ago.  He denies any chest pain shortness of breath lightheadedness palpitations.  No history of GI bleed no history of EGD.  Does not take any NSAIDs other than baby aspirin .  Has been on Coumadin  for 25+ years for " heart problems" last dose of Coumadin  was yesterday evening. No weight loss.  ED Course: Afebrile, nontachycardic blood pressure 150/100 O2 saturation 95% on room air.  Blood work showed hemoglobin 14.6 which is about his baseline WBC 7.7 BUN 28 creatinine 0.9 K4.1 bicarb 24.  Patient was started on PPI twice daily  Review of Systems: As per HPI otherwise 14 point review of systems negative.    Past Medical History:  Diagnosis Date   A-fib (HCC)    Basal cell carcinoma 10/13/2017   right ant neck   Basal cell carcinoma 10/09/2020   right dorsum hand - [facility]   Basal cell carcinoma 10/08/2021   Right mid dorsum nose. Nodular, ulcerated. [facility]   Basal cell carcinoma 04/13/2022   Right Dorsum Nose, [facility]   Coronary artery disease     Dysplastic nevus 02/10/2016   right og midline epigastric   Hypertension    Hypothyroidism    Myocardial infarction (HCC)    Squamous cell carcinoma of skin 07/26/2007   right post earlobe (in situ)   Thyroid  disease     Past Surgical History:  Procedure Laterality Date   CARDIAC SURGERY  1995   CABG x 3   CATARACT EXTRACTION, BILATERAL     CORONARY ARTERY BYPASS GRAFT  1995   triple   CYSTOSCOPY W/ RETROGRADES Bilateral 02/20/2019   Procedure: CYSTOSCOPY WITH RETROGRADE PYELOGRAM;  Surgeon: Dustin Gimenez, MD;  Location: [facility] ORS;  Service: Urology;  Laterality: Bilateral;   HERNIA REPAIR Bilateral    MEATOTOMY N/A 02/20/2019   Procedure: MEATOTOMY ADULT;  Surgeon: Dustin Gimenez, MD;  Location: [facility] ORS;  Service: Urology;  Laterality: N/A;   TRANSURETHRAL RESECTION OF BLADDER TUMOR WITH MITOMYCIN -C N/A 02/20/2019   Procedure: TRANSURETHRAL RESECTION OF BLADDER TUMOR WITH Gemcitabine ;  Surgeon: Dustin Gimenez, MD;  Location: [facility] ORS;  Service: Urology;  Laterality: N/A;     reports that he has never smoked. He has never been exposed to tobacco smoke. He has never used smokeless tobacco. He reports that he does not drink alcohol and does not use drugs.  Allergies  Allergen Reactions   Flexeril [Cyclobenzaprine] Other (See Comments)    Blacks out    No family history on file.  Prior to Admission medications   Medication Sig Start Date End Date Taking?  Authorizing Provider  albuterol  (VENTOLIN  HFA) 108 (90 Base) MCG/ACT inhaler Inhale 2 puffs into the lungs every 4 (four) hours as needed. 12/20/22   Lind Repine, MD  aspirin  EC 81 MG tablet Take 81 mg by mouth daily.    [provider]  finasteride  (PROSCAR ) 5 MG tablet Take 5 mg by mouth daily.  02/19/17   [provider]  furosemide  (LASIX ) 20 MG tablet Take 1 tablet by mouth every other day. 10/30/21   [provider]  gabapentin  (NEURONTIN ) 100 MG capsule Take 100 mg by mouth at bedtime.     [provider]  guaiFENesin  200 MG tablet Take 2 tablets (400 mg total) by mouth every 6 (six) hours as needed for cough or to loosen phlegm. 12/20/22   Lind Repine, MD  ketoconazole  (NIZORAL ) 2 % shampoo APPLY 1 APPLICATION TOPICALLY 3 TIMES A WEEK, SHAMPOO SCALP 3 TIMES A WEEK, LET SIT FOR 5 MINUTES BEFORE RINSING OUT 04/13/22   Elta Halter, MD  levothyroxine  (SYNTHROID , LEVOTHROID) 75 MCG tablet Take 75 mcg by mouth daily before breakfast.  12/04/16   [provider]  simvastatin  (ZOCOR ) 10 MG tablet Take 10 mg by mouth at bedtime. 11/26/21   [provider]  warfarin (COUMADIN ) 2.5 MG tablet Take 2.5 mg by mouth daily.  02/12/17   [provider]    Physical Exam: Vitals:   08/03/23 1225  BP: (!) 158/103  Pulse: 66  Resp: 16  Temp: (!) 97.5 F (36.4 C)  TempSrc: Oral  SpO2: 95%  Weight: 81.6 kg  Height: 5\' 8"  (1.727 m)    Constitutional: NAD, calm, comfortable Vitals:   08/03/23 1225  BP: (!) 158/103  Pulse: 66  Resp: 16  Temp: (!) 97.5 F (36.4 C)  TempSrc: Oral  SpO2: 95%  Weight: 81.6 kg  Height: 5\' 8"  (1.727 m)   Eyes: PERRL, lids and conjunctivae normal ENMT: Mucous membranes are moist. Posterior pharynx clear of any exudate or lesions.Normal dentition.  Neck: normal, supple, no masses, no thyromegaly Respiratory: clear to auscultation bilaterally, no wheezing, no crackles. Normal respiratory effort. No accessory muscle use.  Cardiovascular: Regular rate and rhythm, no murmurs / rubs / gallops. No extremity edema. 2+ pedal pulses. No carotid bruits.  Abdomen: no tenderness, no masses palpated. No hepatosplenomegaly. Bowel sounds positive.  Musculoskeletal: no clubbing / cyanosis. No joint deformity upper and lower extremities. Good ROM, no contractures. Normal muscle tone.  Skin: no rashes, lesions, ulcers. No induration Neurologic: CN 2-12 grossly intact. Sensation intact, DTR normal. Strength 5/5 in all 4.   Psychiatric: Normal judgment and insight. Alert and oriented x 3. Normal mood.     Labs on Admission: I have personally reviewed following labs and imaging studies  CBC: Recent Labs  Lab 08/03/23 1235  WBC 7.7  HGB 14.6  HCT 44.1  MCV 96.1  PLT 210   Basic Metabolic Panel: Recent Labs  Lab 08/03/23 1235  NA 139  K 4.1  CL 104  CO2 24  GLUCOSE 104*  BUN 28*  CREATININE 0.98  CALCIUM 9.1   GFR: Estimated Creatinine Clearance: 39.7 mL/min (by C-G formula based on SCr of 0.98 mg/dL). Liver Function Tests: Recent Labs  Lab 08/03/23 1235  AST 23  ALT 16  ALKPHOS 32*  BILITOT 1.8*  PROT 6.9  ALBUMIN 4.0   No results for input(s): "LIPASE", "AMYLASE" in the last 168 hours. No results for input(s): "AMMONIA" in the last 168 hours. Coagulation Profile: Recent Labs  Lab 08/03/23 1235  INR 2.1*   Cardiac Enzymes: No results for input(s): "CKTOTAL", "CKMB", "CKMBINDEX", "TROPONINI" in the last 168 hours. BNP (last 3 results) No results for input(s): "PROBNP" in the last 8760 hours. HbA1C: No results for input(s): "HGBA1C" in the last 72 hours. CBG: No results for input(s): "GLUCAP" in the last 168 hours. Lipid Profile: No results for input(s): "CHOL", "HDL", "LDLCALC", "TRIG", "CHOLHDL", "LDLDIRECT" in the last 72 hours. Thyroid  Function Tests: No results for input(s): "TSH", "T4TOTAL", "FREET4", "T3FREE", "THYROIDAB" in the last 72 hours. Anemia Panel: No results for input(s): "VITAMINB12", "FOLATE", "FERRITIN", "TIBC", "IRON", "RETICCTPCT" in the last 72 hours. Urine analysis:    Component Value Date/Time   COLORURINE STRAW (A) 02/26/2019 2100   APPEARANCEUR Clear 04/20/2023 1051   LABSPEC 1.005 02/26/2019 2100   PHURINE 7.0 02/26/2019 2100   GLUCOSEU Negative 04/20/2023 1051   HGBUR LARGE (A) 02/26/2019 2100   BILIRUBINUR Negative 04/20/2023 1051   KETONESUR NEGATIVE 02/26/2019 2100   PROTEINUR Negative 04/20/2023 1051   PROTEINUR NEGATIVE  02/26/2019 2100   NITRITE Negative 04/20/2023 1051   NITRITE NEGATIVE 02/26/2019 2100   LEUKOCYTESUR Negative 04/20/2023 1051   LEUKOCYTESUR NEGATIVE 02/26/2019 2100    Radiological Exams on Admission: No results found.  EKG: Ordered  Assessment/Plan Principal Problem:   Lower GI bleed Active Problems:   GI bleed  (please populate well all problems here in Problem List. (For example, if patient is on BP meds at home and you resume or decide to hold them, it is a problem that needs to be her. Same for CAD, COPD, HLD and so on)  Hematochezia - Clinically suspect peptic ulcer given the presentation of melanotic stool.  Guaiac strongly positive in the ED.  Discussed with patient and his son at bedside regarding treatment options, given his age and other comorbidities such as CAD/CABG, all parties agreed with conservative management.  Will recheck H&H this evening and tomorrow morning and transfuse for hemodynamic instability.  Hold off GI consultation and invasive workup including EGD. - Discontinue Coumadin  - 1 dose of subcu vitamin K given.  Given that patient hemoglobin level is relatively stable and vital sign wise no tachycardia or hypotension, we will hold off FFP.  PAF on Coumadin  - Discontinue Coumadin  as mentioned above - Pulse rate is controlled, not on any rate control medications.  HTN - Hold off home BP meds, start as needed hydralazine  Hypothyroidism - Continue Synthroid   DVT prophylaxis: SCD Code Status: DNR Family Communication: Son at bedside Disposition Plan: Patient is sick with melena, while on Coumadint, suspect upper GI bleed, elected for conservative management, expect more than 2 midnight hospital stay Consults called: None Admission status: Tele admit   [place] MD [facility] Pager [phone]  08/03/2023, 3:08 PM

## 2023-08-03 NOTE — ED Notes (Addendum)
 RN found patient in room on bedside commode. Patient and family placed patient on the bedside commode. RN instructed family to call staff to assist to bedside commode. Patient had medium BM that was bloody in nature.

## 2023-08-03 NOTE — ED Triage Notes (Signed)
 Pt states that his stomach was upset starting Sunday, pt states that he started passing stool that looked like grape jelly, black and red and clumpy, states that he has never done that before, pt was ambulatory to triage with a steady gait, denies feeling light headed or sob, reports that he is on a blood thinner

## 2023-08-03 NOTE — ED Notes (Signed)
 Called CCMD.

## 2023-08-03 NOTE — ED Provider Notes (Signed)
 [facility] Provider Note    Event Date/Time   First MD Initiated Contact with Patient 08/03/23 1347     (approximate)   History   Rectal Bleeding   HPI  Carl Coffey is a 89 y.o. male who presents to the emergency department today because of concerns for rectal bleeding.  Patient states that 2 days ago he noticed some looser stools, abdominal pain and dark red stool.  The loose and dark red stool have continued although he says the abdominal pain has improved.  He is on warfarin for previous heart disease.  The patient denies any weakness or shortness of breath.     Physical Exam   Triage Vital Signs: ED Triage Vitals [08/03/23 1225]  Encounter Vitals Group     BP (!) 158/103     Systolic BP Percentile      Diastolic BP Percentile      Pulse Rate 66     Resp 16     Temp (!) 97.5 F (36.4 C)     Temp Source Oral     SpO2 95 %     Weight 180 lb (81.6 kg)     Height 5\' 8"  (1.727 m)     Head Circumference      Peak Flow      Pain Score 0     Pain Loc      Pain Education      Exclude from Growth Chart     Most recent vital signs: Vitals:   08/03/23 1225  BP: (!) 158/103  Pulse: 66  Resp: 16  Temp: (!) 97.5 F (36.4 C)  SpO2: 95%   General: Awake, alert, oriented. CV:  Good peripheral perfusion. Regular rate and rhythm. Resp:  Normal effort. Lungs clear. Abd:  No distention. Non tender. Rectal:  Dark brown stool on glove. GUIAC positive.   ED Results / Procedures / Treatments   Labs (all labs ordered are listed, but only abnormal results are displayed) Labs Reviewed  COMPREHENSIVE METABOLIC PANEL WITH GFR - Abnormal; Notable for the following components:      Result Value   Glucose, Bld 104 (*)    BUN 28 (*)    Alkaline Phosphatase 32 (*)    Total Bilirubin 1.8 (*)    All other components within normal limits  PROTIME-INR - Abnormal; Notable for the following components:   Prothrombin Time 24.0 (*)    INR 2.1 (*)    All  other components within normal limits  CBC  HEMOGLOBIN AND HEMATOCRIT, BLOOD  IRON AND TIBC  FERRITIN  POC OCCULT BLOOD, ED  TYPE AND SCREEN     EKG  None   RADIOLOGY None   PROCEDURES:  Critical Care performed: No   MEDICATIONS ORDERED IN ED: Medications  pantoprazole (PROTONIX) injection 40 mg (has no administration in time range)    Followed by  pantoprazole (PROTONIX) injection 40 mg (has no administration in time range)    Followed by  pantoprazole (PROTONIX) injection 40 mg (has no administration in time range)  phytonadione (VITAMIN K) SQ injection 5 mg (has no administration in time range)     IMPRESSION / MDM / ASSESSMENT AND PLAN / ED COURSE  I reviewed the triage vital signs and the nursing notes.                              Differential diagnosis includes, but is  not limited to, AVM, diverticulosis, neoplasm, hemorrhoid, gastritis  Patient's presentation is most consistent with acute presentation with potential threat to life or bodily function.   The patient is on the cardiac monitor to evaluate for evidence of arrhythmia and/or significant heart rate changes.  Patient presented to the emergency department today because of concerns for possible GI bleed.  On exam patient was guaiac positive with dark brown stool on the glove.  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  Patient is neither hypotensive nor tachycardic.  However patient is on warfarin and INR is elevated at 2.1.  Will plan on starting IV Protonix.  Discussed with Dr. Jeane Miguel with the hospitalist service who will evaluate for admission.    FINAL CLINICAL IMPRESSION(S) / ED DIAGNOSES   Final diagnoses:  Acute GI bleeding        Rx / DC Orders       Note:  This document was prepared using Dragon voice recognition software and may include unintentional dictation errors.    Marylynn Soho, MD 08/03/23 [phone]

## 2023-08-04 ENCOUNTER — Inpatient Hospital Stay

## 2023-08-04 DIAGNOSIS — K922 Gastrointestinal hemorrhage, unspecified: Secondary | ICD-10-CM | POA: Diagnosis not present

## 2023-08-04 LAB — PHOSPHORUS: Phosphorus: 2.8 mg/dL (ref 2.5–4.6)

## 2023-08-04 LAB — BASIC METABOLIC PANEL WITH GFR
Anion gap: 3 — ABNORMAL LOW (ref 5–15)
BUN: 25 mg/dL — ABNORMAL HIGH (ref 8–23)
CO2: 27 mmol/L (ref 22–32)
Calcium: 8.8 mg/dL — ABNORMAL LOW (ref 8.9–10.3)
Chloride: 110 mmol/L (ref 98–111)
Creatinine, Ser: 0.87 mg/dL (ref 0.61–1.24)
GFR, Estimated: >60 mL/min (ref >60)
Glucose, Bld: 87 mg/dL (ref 70–99)
Potassium: 3.6 mmol/L (ref 3.5–5.1)
Sodium: 140 mmol/L (ref 135–145)

## 2023-08-04 LAB — CBC
HCT: 40 % (ref 39.0–52.0)
Hemoglobin: 13.6 g/dL (ref 13.0–17.0)
MCH: 32 pg (ref 26.0–34.0)
MCHC: 34 g/dL (ref 30.0–36.0)
MCV: 94.1 fL (ref 80.0–100.0)
Platelets: 188 10*3/uL (ref 150–400)
RBC: 4.25 MIL/uL (ref 4.22–5.81)
RDW: 14 % (ref 11.5–15.5)
WBC: 7.1 10*3/uL (ref 4.0–10.5)
nRBC: 0 % (ref 0.0–0.2)

## 2023-08-04 LAB — MAGNESIUM: Magnesium: 2.2 mg/dL (ref 1.7–2.4)

## 2023-08-04 LAB — HEMOGLOBIN AND HEMATOCRIT, BLOOD
HCT: 41.5 % (ref 39.0–52.0)
Hemoglobin: 13.7 g/dL (ref 13.0–17.0)

## 2023-08-04 LAB — PROTIME-INR
INR: 2.1 — ABNORMAL HIGH (ref 0.8–1.2)
Prothrombin Time: 23.5 s — ABNORMAL HIGH (ref 11.4–15.2)

## 2023-08-04 MED ORDER — IOHEXOL 350 MG/ML SOLN
100.0000 mL | Freq: Once | INTRAVENOUS | Status: AC | PRN
  Administered 2023-08-04: 100 mL via INTRAVENOUS

## 2023-08-04 NOTE — Progress Notes (Signed)
 [facility] Progress Note  Patient: Carl Coffey    XBM:[id number]  DOA: 08/03/2023     Date of Service: the patient was seen and examined on 08/04/2023  Chief Complaint  Patient presents with   Rectal Bleeding   Brief hospital course: Carl Coffey is a 89 y.o. male with medical history significant of CAD status post CABG in 1990s, PAF on Eliquis, HTN, hypothyroidism, bladder cancer, presented with repeated black tarry stool.  As per patient bleeding started on Sunday, having dark stools with some red blood in it.  Denied any abdominal pain, some cramping during BM. Patient denied any other complaints. Does not take any NSAIDs other than baby aspirin .  Has been on Coumadin  for 25+ years for " heart problems" last dose of Coumadin  was on 5/19 evening. No weight loss. No history of GI bleed no history of EGD.    ED Course: Afebrile, nontachycardic blood pressure 150/100 O2 saturation 95% on room air.   CBC: Hb 14.6 which is about his baseline WBC 7.7 BUN 28 creatinine 0.9 K4.1 bicarb 24.   Patient was started on PPI twice daily    Assessment and Plan:  # Hematochezia, unknown cause Clinically suspect peptic ulcer given the presentation of melanotic stool.  Guaiac strongly positive in the ED.  Discussed with patient and his son at bedside regarding treatment options, given his age and other comorbidities such as CAD/CABG, all parties agreed with conservative management.  Monitor H&H and transfuse if significant blood loss, keep hemoglobin above 7 Hold off GI consultation and invasive workup including EGD. - Discontinue Coumadin  and ASA for now  - 1 dose of subcu vitamin K given.  Given that patient hemoglobin level is relatively stable and vital sign wise no tachycardia or hypotension, we will hold off FFP. 5/21 Hb 13.6, stable CT angio GI bleed negative for any bleeding, positive for diverticulosis, no diverticulitis, cholelithiasis, no cholecystitis.  No any other significant  findings.      # PAF on Coumadin  - Discontinue Coumadin  as mentioned above - Pulse rate is controlled, not on any rate control medications. INR 2.1    # HTN: Hold off home BP meds, start as needed hydralazine Monitor BP and titrate medications accordingly  # Hypothyroidism: Continue Synthroid    Body mass index is 27.37 kg/m.  Interventions:  Diet: CLD DVT Prophylaxis: SCD, pharmacological prophylaxis contraindicated due to GI bleeding   Advance goals of care discussion: DNR/DNI-limited  Family Communication: family was present at bedside, at the time of interview.  The pt provided permission to discuss medical plan with the family. Opportunity was given to ask question and all questions were answered satisfactorily.   Disposition:  Pt is from home, admitted with GI bleeding, still remains at high risk for bleeding, which precludes a safe discharge. Discharge to home, when stable, may need 1-2 more days to improve.  Subjective: No significant events overnight, patient still having black stool, had 1 BM today morning which was black and some red blood.  He denied any chest pain or palpitations, no any other complaints. Patient and family does not want GI consult, as they are not proceeding with EGD and colonoscopy.  Agreed for CT angio GI bleeding scan.  Physical Exam: General: NAD, lying comfortably Appear in no distress, affect appropriate Eyes: PERRLA ENT: Oral Mucosa Clear, moist  Neck: no JVD,  Cardiovascular: S1 and S2 Present, no Murmur,  Respiratory: good respiratory effort, Bilateral Air entry equal and Decreased, no Crackles, no  wheezes Abdomen: Bowel Sound present, Soft and no tenderness,  Skin: no rashes Extremities: no Pedal edema, no calf tenderness Neurologic: without any new focal findings Gait not checked due to patient safety concerns  Vitals:   08/03/23 1833 08/03/23 2058 08/04/23 0209 08/04/23 0745  BP: (!) 143/79 (!) 141/67 138/88 136/78  Pulse:   70 66 (!) 50  Resp:  17 19 16   Temp:  98.4 F (36.9 C) 98 F (36.7 C) 98.1 F (36.7 C)  TempSrc:      SpO2:  98% 94% 95%  Weight:      Height:        Intake/Output Summary (Last 24 hours) at 08/04/2023 1330 Last data filed at 08/04/2023 0602 Gross per 24 hour  Intake --  Output 550 ml  Net -550 ml   Filed Weights   08/03/23 1225  Weight: 81.6 kg    Data Reviewed: I have personally reviewed and interpreted daily labs, tele strips, imagings as discussed above. I reviewed all nursing notes, pharmacy notes, vitals, pertinent old records I have discussed plan of care as described above with RN and patient/family.  CBC: Recent Labs  Lab 08/03/23 1235 08/03/23 2135 08/04/23 0512  WBC 7.7  --  7.1  HGB 14.6 13.7 13.6  HCT 44.1 40.2 40.0  MCV 96.1  --  94.1  PLT 210  --  188   Basic Metabolic Panel: Recent Labs  Lab 08/03/23 1235 08/04/23 0512  NA 139 140  K 4.1 3.6  CL 104 110  CO2 24 27  GLUCOSE 104* 87  BUN 28* 25*  CREATININE 0.98 0.87  CALCIUM 9.1 8.8*  MG  --  2.2  PHOS  --  2.8    Studies: CT ANGIO GI BLEED Result Date: 08/04/2023 CLINICAL DATA:  Lower GI bleed, 3 days of black and tarry stools EXAM: CTA ABDOMEN AND PELVIS WITHOUT AND WITH CONTRAST TECHNIQUE: Multidetector CT imaging of the abdomen and pelvis was performed using the standard protocol during bolus administration of intravenous contrast. Multiplanar reconstructed images and MIPs were obtained and reviewed to evaluate the vascular anatomy. RADIATION DOSE REDUCTION: This exam was performed according to the departmental dose-optimization program which includes automated exposure control, adjustment of the mA and/or kV according to patient size and/or use of iterative reconstruction technique. CONTRAST:  OMNIPAQUE  IOHEXOL  350 MG/ML SOLN COMPARISON:  None Available. FINDINGS: VASCULAR Normal contour and caliber of the abdominal aorta. No evidence of aneurysm, dissection, or other acute aortic  pathology. Standard branching pattern of the abdominal aorta with solitary bilateral renal arteries. Moderate aortic atherosclerosis. Review of the MIP images confirms the above findings. NON-VASCULAR Lower Chest: No acute findings. Cardiomegaly. Three-vessel coronary artery calcifications. Bibasilar scarring or atelectasis. Elevation of the right hemidiaphragm. Hepatobiliary: No solid liver abnormality is seen. Gallstones. No gallbladder wall thickening, or biliary dilatation. Pancreas: Unremarkable. No pancreatic ductal dilatation or surrounding inflammatory changes. Spleen: Normal in size without significant abnormality. Adrenals/Urinary Tract: Adrenal glands are unremarkable. Multiple bilateral renal cortical cysts, some of which are hemorrhagic or proteinaceous, benign, requiring no further follow-up or characterization. No calculi or hydronephrosis. Bladder is unremarkable. Stomach/Bowel: Stomach is within normal limits. Normal appendix. Pancolonic diverticulosis. No bowel distention or inflammatory findings. No intraluminal contrast extravasation or other findings to specifically localize GI bleeding. Hepatic flexure of the colon is anterior to the liver ([place] configuration). Lymphatic: No enlarged abdominal or pelvic lymph nodes. Reproductive: Prostatomegaly. Other: Small, fat containing bilateral inguinal hernias. No ascites. Musculoskeletal: No acute osseous  findings. IMPRESSION: 1. No intraluminal contrast extravasation or other findings to specifically localize GI bleeding. 2. Pancolonic diverticulosis without evidence of acute diverticulitis. 3. Cholelithiasis without evidence of acute cholecystitis. 4. Prostatomegaly. 5. Cardiomegaly and coronary artery disease. Aortic Atherosclerosis (ICD10-I70.0). Electronically Signed   By: Fredricka Jenny M.D.   On: 08/04/2023 13:22    Scheduled Meds:  finasteride   5 mg Oral Daily   gabapentin   100 mg Oral QHS   levothyroxine   75 mcg Oral Q0600    pantoprazole (PROTONIX) IV  40 mg Intravenous Q8H   Followed by   Cecily Cohen ON 08/06/2023] pantoprazole (PROTONIX) IV  40 mg Intravenous Q12H   simvastatin   10 mg Oral QHS   Continuous Infusions: PRN Meds: albuterol , guaiFENesin , hydrALAZINE, ondansetron  **OR** ondansetron  (ZOFRAN ) IV  Time spent: 35 minutes  Author: Althia Atlas. MD [facility] 08/04/2023 1:30 PM  To reach On-call, see care teams to locate the attending and reach out to them via [email or web address]. If 7PM-7AM, please contact night-coverage If you still have difficulty reaching the attending provider, please page the [facility] (Director on Call) for [facility] on amion for assistance.

## 2023-08-04 NOTE — TOC CM/SW Note (Signed)
 Transition of Care [facility]) - Inpatient Brief Assessment   Patient Details  Name: Carl Coffey MRN: [id number] Date of Birth: Dec 30, 1921  Transition of Care [facility]) CM/SW Contact:    Loman Risk, RN Phone Number: 08/04/2023, 11:40 AM   Clinical Narrative:   Transition of Care (TOC) Screening Note   Patient Details  Name: Carl Coffey Date of Birth: Dec 15, 1921   Transition of Care [facility]) CM/SW Contact:    Loman Risk, RN Phone Number: 08/04/2023, 11:40 AM    Transition of Care Department [facility]) has reviewed patient and no TOC needs have been identified at this time. . If new patient transition needs arise, please place a TOC consult.    Transition of Care Asessment: Insurance and Status: Insurance coverage has been reviewed Patient has primary care physician: Yes     Prior/Current Home Services: No current home services Social Drivers of Health Review: SDOH reviewed no interventions necessary   Transition of care needs: no transition of care needs at this time

## 2023-08-04 NOTE — Plan of Care (Signed)

## 2023-08-05 ENCOUNTER — Telehealth: Payer: Self-pay | Admitting: Cardiovascular Disease

## 2023-08-05 DIAGNOSIS — K922 Gastrointestinal hemorrhage, unspecified: Secondary | ICD-10-CM | POA: Diagnosis not present

## 2023-08-05 LAB — BASIC METABOLIC PANEL WITH GFR
Anion gap: 6 (ref 5–15)
BUN: 22 mg/dL (ref 8–23)
CO2: 24 mmol/L (ref 22–32)
Calcium: 8.7 mg/dL — ABNORMAL LOW (ref 8.9–10.3)
Chloride: 110 mmol/L (ref 98–111)
Creatinine, Ser: 0.92 mg/dL (ref 0.61–1.24)
GFR, Estimated: >60 mL/min (ref >60)
Glucose, Bld: 90 mg/dL (ref 70–99)
Potassium: 3.6 mmol/L (ref 3.5–5.1)
Sodium: 140 mmol/L (ref 135–145)

## 2023-08-05 LAB — CBC
HCT: 39.1 % (ref 39.0–52.0)
Hemoglobin: 13.2 g/dL (ref 13.0–17.0)
MCH: 31.6 pg (ref 26.0–34.0)
MCHC: 33.8 g/dL (ref 30.0–36.0)
MCV: 93.5 fL (ref 80.0–100.0)
Platelets: 187 10*3/uL (ref 150–400)
RBC: 4.18 MIL/uL — ABNORMAL LOW (ref 4.22–5.81)
RDW: 14 % (ref 11.5–15.5)
WBC: 6.9 10*3/uL (ref 4.0–10.5)
nRBC: 0 % (ref 0.0–0.2)

## 2023-08-05 LAB — PHOSPHORUS: Phosphorus: 2.9 mg/dL (ref 2.5–4.6)

## 2023-08-05 LAB — MAGNESIUM: Magnesium: 2.1 mg/dL (ref 1.7–2.4)

## 2023-08-05 NOTE — Discharge Summary (Signed)
 [facility] Discharge Summary   Patient: Carl Coffey EAV:[id number]  PCP: Jimmy Moulding, MD  Date of admission: 08/03/2023   Date of discharge:  08/05/2023     Discharge Diagnoses:  Principal Problem:   Lower GI bleed Active Problems:   GI bleed   Admitted From: Home Disposition:  Home   Recommendations for Outpatient Follow-up:  Follow-up with PCP in 1 week, repeat CBC in 1 week, resume aspirin  and Coumadin  gradually, keep INR at lower end. Patient may need to stop Coumadin  if recurrent bleeding. Follow up LABS/TEST: As above   Follow-up Information     Jimmy Moulding, MD Follow up in 1 week(s).   Specialty: Internal Medicine Contact information: [address] [facility] [place] [address] [phone]                Diet recommendation: Regular diet  Activity: The patient is advised to gradually reintroduce usual activities, as tolerated  Discharge Condition: stable  Code Status: DNR/DNI-limited  History of present illness: As per the H and P dictated on admission. Hospital Course:  Carl Coffey is a 89 y.o. male with medical history significant of CAD status post CABG in 1990s, PAF on Eliquis, HTN, hypothyroidism, bladder cancer, presented with repeated black tarry stool.  As per patient bleeding started on Sunday, having dark stools with some red blood in it.  Denied any abdominal pain, some cramping during BM. Patient denied any other complaints. Does not take any NSAIDs other than baby aspirin .  Has been on Coumadin  for 25+ years for " heart problems" last dose of Coumadin  was on 5/19 evening. No weight loss. No history of GI bleed no history of EGD.     ED Course: Afebrile, nontachycardic blood pressure 150/100 O2 saturation 95% on room air.   CBC: Hb 14.6 which is about his baseline WBC 7.7 BUN 28 creatinine 0.9 K4.1 bicarb 24. Patient was started on PPI twice daily   Assessment and Plan:  # Hematochezia,  unknown cause Clinically suspect peptic ulcer given the presentation of melanotic stool.  Guaiac strongly positive in the ED.  Discussed with patient and his son at bedside regarding treatment options, given his age and other comorbidities such as CAD/CABG, all parties agreed with conservative management.  Held off GI consultation and invasive workup including EGD. Hled Coumadin  and ASA during hospital stay.  S/p 1 dose of subcu vitamin K given.  Given that patient hemoglobin level is relatively stable and vital sign wise no tachycardia or hypotension, held off FFP.   CT angio GI bleed negative for any bleeding, positive for diverticulosis, no diverticulitis, cholelithiasis, no cholecystitis.  No any other significant findings.   H&H remained stable.  Patient did not notice any further bleeding.  Vital signs were stable, patient was clinically stable and he wanted to go home.  Seems stable to discharge and recommended to gradually start aspirin  and then Coumadin  in few days if no bleeding.  Monitor INR and repeat CBC in 1 week.  Keep INR close to 2.0  # PAF on Coumadin : Held Coumadin  during hospital stay, advised to resume aspirin  first and then Coumadin  if no bleeding.  Monitor INR and keep INR close to 2.0.  May need to completely stop Coumadin  if recurrent GI bleeding. # HTN: BP stable, resumed home medications. # Hypothyroidism: Continue Synthroid   Body mass index is 27.37 kg/m.  Nutrition Interventions:  Patient was ambulatory without any assistance. On the day  of the discharge the patient's vitals were stable, and no other acute medical condition were reported by patient. the patient was felt safe to be discharge at Home.  Consultants: None Procedures: None  Discharge Exam: General: Appear in no distress, no Rash; Oral Mucosa Clear, moist. Cardiovascular: S1 and S2 Present, no Murmur, Respiratory: normal respiratory effort, Bilateral Air entry present and no Crackles, no  wheezes Abdomen: Bowel Sound present, Soft and no tenderness, no hernia Extremities: no Pedal edema, no calf tenderness Neurology: alert and oriented to time, place, and person affect appropriate.  Filed Weights   08/03/23 1225  Weight: 81.6 kg   Vitals:   08/05/23 0235 08/05/23 0904  BP: 121/60 (!) 142/80  Pulse: (!) 56 63  Resp: 18 18  Temp: 98.5 F (36.9 C) 98 F (36.7 C)  SpO2: 97% 96%    DISCHARGE MEDICATION: Allergies as of 08/05/2023       Reactions   Flexeril [cyclobenzaprine] Other (See Comments)   Blacks out        Medication List     TAKE these medications    albuterol  108 (90 Base) MCG/ACT inhaler Commonly known as: VENTOLIN  HFA Inhale 2 puffs into the lungs every 4 (four) hours as needed.   amLODipine 5 MG tablet Commonly known as: NORVASC Take 5 mg by mouth daily.   aspirin  EC 81 MG tablet Take 81 mg by mouth daily.   cyanocobalamin  1000 MCG tablet Commonly known as: VITAMIN B12 Take 1,000 mcg by mouth daily.   finasteride  5 MG tablet Commonly known as: PROSCAR  Take 5 mg by mouth daily.   furosemide  20 MG tablet Commonly known as: LASIX  Take 20 mg by mouth every other day.   gabapentin  100 MG capsule Commonly known as: NEURONTIN  Take 100 mg by mouth at bedtime.   guaiFENesin  200 MG tablet Take 2 tablets (400 mg total) by mouth every 6 (six) hours as needed for cough or to loosen phlegm.   ketoconazole  2 % shampoo Commonly known as: NIZORAL  APPLY 1 APPLICATION TOPICALLY 3 TIMES A WEEK, SHAMPOO SCALP 3 TIMES A WEEK, LET SIT FOR 5 MINUTES BEFORE RINSING OUT   levothyroxine  75 MCG tablet Commonly known as: SYNTHROID  Take 75 mcg by mouth daily before breakfast.   simvastatin  10 MG tablet Commonly known as: ZOCOR  Take 10 mg by mouth at bedtime.   warfarin 2.5 MG tablet Commonly known as: COUMADIN  Take 2.5 mg by mouth daily.       Allergies  Allergen Reactions   Flexeril [Cyclobenzaprine] Other (See Comments)    Blacks out    Discharge Instructions     Call MD for:   Complete by: As directed    Recurrent GI bleeding   Call MD for:  difficulty breathing, headache or visual disturbances   Complete by: As directed    Call MD for:  extreme fatigue   Complete by: As directed    Call MD for:  persistant dizziness or light-headedness   Complete by: As directed    Call MD for:  persistant nausea and vomiting   Complete by: As directed    Call MD for:  redness, tenderness, or signs of infection (pain, swelling, redness, odor or green/yellow discharge around incision site)   Complete by: As directed    Call MD for:  severe uncontrolled pain   Complete by: As directed    Call MD for:  temperature >100.4   Complete by: As directed    Diet - low sodium heart  healthy   Complete by: As directed    Discharge instructions   Complete by: As directed    Follow-up with PCP in 1 week, repeat CBC in 1 week, resume aspirin  and Coumadin  gradually, keep INR at lower end. Patient may need to stop Coumadin  if recurrent bleeding.   Increase activity slowly   Complete by: As directed        The results of significant diagnostics from this hospitalization (including imaging, microbiology, ancillary and laboratory) are listed below for reference.    Significant Diagnostic Studies: CT ANGIO GI BLEED Result Date: 08/04/2023 CLINICAL DATA:  Lower GI bleed, 3 days of black and tarry stools EXAM: CTA ABDOMEN AND PELVIS WITHOUT AND WITH CONTRAST TECHNIQUE: Multidetector CT imaging of the abdomen and pelvis was performed using the standard protocol during bolus administration of intravenous contrast. Multiplanar reconstructed images and MIPs were obtained and reviewed to evaluate the vascular anatomy. RADIATION DOSE REDUCTION: This exam was performed according to the departmental dose-optimization program which includes automated exposure control, adjustment of the mA and/or kV according to patient size and/or use of iterative  reconstruction technique. CONTRAST:  OMNIPAQUE  IOHEXOL  350 MG/ML SOLN COMPARISON:  None Available. FINDINGS: VASCULAR Normal contour and caliber of the abdominal aorta. No evidence of aneurysm, dissection, or other acute aortic pathology. Standard branching pattern of the abdominal aorta with solitary bilateral renal arteries. Moderate aortic atherosclerosis. Review of the MIP images confirms the above findings. NON-VASCULAR Lower Chest: No acute findings. Cardiomegaly. Three-vessel coronary artery calcifications. Bibasilar scarring or atelectasis. Elevation of the right hemidiaphragm. Hepatobiliary: No solid liver abnormality is seen. Gallstones. No gallbladder wall thickening, or biliary dilatation. Pancreas: Unremarkable. No pancreatic ductal dilatation or surrounding inflammatory changes. Spleen: Normal in size without significant abnormality. Adrenals/Urinary Tract: Adrenal glands are unremarkable. Multiple bilateral renal cortical cysts, some of which are hemorrhagic or proteinaceous, benign, requiring no further follow-up or characterization. No calculi or hydronephrosis. Bladder is unremarkable. Stomach/Bowel: Stomach is within normal limits. Normal appendix. Pancolonic diverticulosis. No bowel distention or inflammatory findings. No intraluminal contrast extravasation or other findings to specifically localize GI bleeding. Hepatic flexure of the colon is anterior to the liver ([place]iti configuration). Lymphatic: No enlarged abdominal or pelvic lymph nodes. Reproductive: Prostatomegaly. Other: Small, fat containing bilateral inguinal hernias. No ascites. Musculoskeletal: No acute osseous findings. IMPRESSION: 1. No intraluminal contrast extravasation or other findings to specifically localize GI bleeding. 2. Pancolonic diverticulosis without evidence of acute diverticulitis. 3. Cholelithiasis without evidence of acute cholecystitis. 4. Prostatomegaly. 5. Cardiomegaly and coronary artery disease.  Aortic Atherosclerosis (ICD10-I70.0). Electronically Signed   By: Fredricka Jenny M.D.   On: 08/04/2023 13:22    Microbiology: No results found for this or any previous visit (from the past 240 hours).   Labs: CBC: Recent Labs  Lab 08/03/23 1235 08/03/23 2135 08/04/23 0512 08/04/23 1546 08/05/23 0545  WBC 7.7  --  7.1  --  6.9  HGB 14.6 13.7 13.6 13.7 13.2  HCT 44.1 40.2 40.0 41.5 39.1  MCV 96.1  --  94.1  --  93.5  PLT 210  --  188  --  187   Basic Metabolic Panel: Recent Labs  Lab 08/03/23 1235 08/04/23 0512 08/05/23 0545  NA 139 140 140  K 4.1 3.6 3.6  CL 104 110 110  CO2 24 27 24   GLUCOSE 104* 87 90  BUN 28* 25* 22  CREATININE 0.98 0.87 0.92  CALCIUM 9.1 8.8* 8.7*  MG  --  2.2 2.1  PHOS  --  2.8 2.9   Liver Function Tests: Recent Labs  Lab 08/03/23 1235  AST 23  ALT 16  ALKPHOS 32*  BILITOT 1.8*  PROT 6.9  ALBUMIN 4.0   No results for input(s): "LIPASE", "AMYLASE" in the last 168 hours. No results for input(s): "AMMONIA" in the last 168 hours. Cardiac Enzymes: No results for input(s): "CKTOTAL", "CKMB", "CKMBINDEX", "TROPONINI" in the last 168 hours. BNP (last 3 results) Recent Labs    12/20/22 2032  BNP 150.5*   CBG: No results for input(s): "GLUCAP" in the last 168 hours.  Time spent: 35 minutes  Signed:  Althia Atlas  [facility] 08/05/2023 11:38 AM

## 2023-08-05 NOTE — Plan of Care (Signed)

## 2023-08-05 NOTE — Telephone Encounter (Signed)
 Patients son reports that he was admitted due to blood in his stool. His coumadin  had been held and once they were able to get the bleeding under control they sent him home with instructions to restart ASA in 3 days and to gradually restart the coumadin  and to keep his numbers on the low side. Son concerned because patient has been on this medication since 1995 after having heart surgery. He would like to see if they can come in to discuss this information. Scheduled to come in tomorrow to see APP for further discussion.

## 2023-08-05 NOTE — Telephone Encounter (Signed)
 Pt c/o medication issue:  1. Name of Medication:   warfarin (COUMADIN ) 2.5 MG tablet    2. How are you currently taking this medication (dosage and times per day)? Not currently taking   3. Are you having a reaction (difficulty breathing--STAT)? no  4. What is your medication issue? Pt was in the hospital for bleeding and they have taken him of medication. Please advise

## 2023-08-05 NOTE — Plan of Care (Signed)

## 2023-08-06 ENCOUNTER — Encounter: Payer: Self-pay | Admitting: Student

## 2023-08-06 ENCOUNTER — Ambulatory Visit: Attending: Student | Admitting: Student

## 2023-08-06 VITALS — BP 126/77 | HR 59 | Ht 69.0 in | Wt 177.0 lb

## 2023-08-06 DIAGNOSIS — I25810 Atherosclerosis of coronary artery bypass graft(s) without angina pectoris: Secondary | ICD-10-CM | POA: Diagnosis not present

## 2023-08-06 DIAGNOSIS — E785 Hyperlipidemia, unspecified: Secondary | ICD-10-CM | POA: Diagnosis not present

## 2023-08-06 DIAGNOSIS — K5791 Diverticulosis of intestine, part unspecified, without perforation or abscess with bleeding: Secondary | ICD-10-CM

## 2023-08-06 DIAGNOSIS — I4821 Permanent atrial fibrillation: Secondary | ICD-10-CM

## 2023-08-06 DIAGNOSIS — R600 Localized edema: Secondary | ICD-10-CM | POA: Diagnosis not present

## 2023-08-06 DIAGNOSIS — Z5181 Encounter for therapeutic drug level monitoring: Secondary | ICD-10-CM | POA: Insufficient documentation

## 2023-08-06 DIAGNOSIS — I10 Essential (primary) hypertension: Secondary | ICD-10-CM

## 2023-08-06 NOTE — Patient Instructions (Signed)
 Medication Instructions:  Your Physician recommend you continue on your current medication as directed.    *If you need a refill on your cardiac medications before your next appointment, please call your pharmacy*  Lab Work: None ordered at this time  If you have labs (blood work) drawn today and your tests are completely normal, you will receive your results only by: MyChart Message (if you have MyChart) OR A paper copy in the mail If you have any lab test that is abnormal or we need to change your treatment, we will call you to review the results.  Testing/Procedures: None ordered at this time   Follow-Up: At [facility], you and your health needs are our priority.  As part of our continuing mission to provide you with exceptional heart care, our providers are all part of one team.  This team includes your primary Cardiologist (physician) and Advanced Practice Providers or APPs (Physician Assistants and Nurse Practitioners) who all work together to provide you with the care you need, when you need it.  Your next appointment:   3 month(s)  Provider:   You may see Timothy [name], MD or one of the following Advanced Practice Providers on your designated Care Team:   Laneta Pintos, NP Gildardo Labrador, PA-C Varney Gentleman, PA-C Cadence Warsaw, PA-C Ronald Cockayne, NP Morey Ar, NP    We recommend signing up for the patient portal called "MyChart".  Sign up information is provided on this After Visit Summary.  MyChart is used to connect with patients for Virtual Visits (Telemedicine).  Patients are able to view lab/test results, encounter notes, upcoming appointments, etc.  Non-urgent messages can be sent to your provider as well.   To learn more about what you can do with MyChart, go to [email or web address].   Other Instructions  You are being referred to our Coumadin  Monitoring Clinic.

## 2023-08-06 NOTE — Progress Notes (Signed)
 Cardiology Clinic Note   Date: 08/06/2023 ID: Carl, Coffey 24-Sep-1921, MRN [id number]  Primary Cardiologist:  [name], MD  Chief Complaint   Carl Coffey is a 89 y.o. male who presents to the clinic today for hospital follow up.   Patient Profile   Carl Coffey is followed by Dr. [name] for the history outlined below.      Past medical history significant for: CAD. S/p CABG x 16 June 1993. Permanent A-fib. Hypertension. Hyperlipidemia. Lipid panel 04/26/2023: LDL 61, HDL 52, TG 83, total 130. Hypothyroidism.  In summary, patient was previously followed by Dr. [name].  He established care with Dr. [name] on 07/27/2022.  Patient has a long history of A-fib with rate control at anticoagulation on Coumadin .  At the time of his visit he was not on any rate control medications.  Metoprolol had been held previously possibly for hypotension.  Coumadin  managed by PCP.  Patient was last seen in the office by Dr. [name] on 02/01/2023 for routine follow-up.  His BP was low at that time and 100/54 on amlodipine was held.  He continued on Lasix  every other day for ankle edema.  Patient presented to the ED on 08/03/2023 for blood in stool and abdominal pain.  He reported 2 days prior he had episodes of loose stool with abdominal pain.  He noted dark red stool that had continued although abdominal pain improved.  He was hypertensive upon arrival to ED at 158/103.  He denied weakness or shortness of breath.  Initial labs WBC 7.7, hemoglobin 14.6, hematocrit 44.1, sodium 139, potassium 4.1, creatinine 0.90, BUN, AST 23, ALT 16.  Decision was made with family for conservative management and GI was not consulted.  Coumadin  was held.  Patient was given 1 dose of subcu vitamin K.  CTA GI bleed showed no intraluminal contrast extravasation or other findings to specifically localize GI bleeding, pancolonic diverticulosis without evidence of acute diverticulitis, cholelithiasis without evidence of acute  cholecystitis, prostamegaly, cardiomegaly and CAD.  Patient was started on PPI.  Patient's hemoglobin remained relatively stable throughout hospital admission with last hemoglobin 13.2 and hematocrit 39.1.  Patient was discharged on 08/05/2023.  Final discharge note not available.  Patient's son contacted the office on 08/05/2023 secondary to Coumadin  being stopped in the hospital.  Per triage RN "Patients son reports that he was admitted due to blood in his stool. His coumadin  had been held and once they were able to get the bleeding under control they sent him home with instructions to restart ASA in 3 days and to gradually restart the coumadin  and to keep his numbers on the low side. Son concerned because patient has been on this medication since 1995 after having heart surgery. He would like to see if they can come in to discuss this information. Scheduled to come in tomorrow to see APP for further discussion."     History of Present Illness    Today, patient is accompanied by his son. He reports he is doing well since hospital discharge yesterday. No further episodes of dark stool. He is interested in changing management of coumadin  to our coumadin  clinic. Patient and son report that they saw 3 different providers during hospital admission that gave them conflicting instructions regarding stopping and starting medications. Given the differing instructions they felt it best to speak to a cardiology provider to get definitive instructions. Patient denies dyspnea with or without exertion, lightheadedness, dizziness, presyncope or syncope. He has no palpitations or  cardiac awareness of arrhythmia. He no longer has lower extremity edema.     ROS: All other systems reviewed and are otherwise negative except as noted in History of Present Illness.  EKGs/Labs Reviewed       EKG is not performed today.   08/03/2023: ALT 16; AST 23 08/05/2023: BUN 22; Creatinine, Ser 0.92; Potassium 3.6; Sodium 140    08/05/2023: Hemoglobin 13.2; WBC 6.9   12/20/2022: B Natriuretic Peptide 150.5    Risk Assessment/Calculations     CHA2DS2-VASc Score = 4   This indicates a 4.8% annual risk of stroke. The patient's score is based upon: CHF History: 0 HTN History: 1 Diabetes History: 0 Stroke History: 0 Vascular Disease History: 1 Age Score: 2 Gender Score: 0             Physical Exam    VS:  BP 126/77 (BP Location: Left Arm)   Pulse (!) 59   Ht 5\' 9"  (1.753 m)   Wt 177 lb (80.3 kg)   SpO2 98%   BMI 26.14 kg/m  , BMI Body mass index is 26.14 kg/m.  GEN: Well nourished, well developed, in no acute distress. Neck: No JVD or carotid bruits. Cardiac: Irregularly irregular rhythm. No murmurs. No rubs or gallops.   Respiratory:  Respirations regular and unlabored. Clear to auscultation without rales, wheezing or rhonchi. GI: Soft, nontender, nondistended. Extremities: Radials/DP/PT 2+ and equal bilaterally. No clubbing or cyanosis. No edema.  Skin: Warm and dry, no rash. Neuro: Strength intact.  Assessment & Plan   CAD S/p CABG x 16 June 1993.  Patient denies chest pain, pressure or tightness. Aspirin  currently on hold per recent hospital admission. Discussed with Dr. [name] who is in agreement with discontinuing aspirin  permanently.  - Patient is instructed to not restart aspirin .   Permanent A-fib Patient has no cardiac awareness of afib. Irregularly irregular on exam today. Coumadin  held secondary to GI bleeding and recent hospital admission. Discussed with Dr. [name] who is in agreement with restarting coumadin  today. Patient would like to transfer management of coumadin  from PCP to our coumadin  clinic. Will not redraw CBC today, as patient just had one yesterday prior to discharge.  - Restart Coumadin  at 2.5 mg today.  - Schedule Coumadin  clinic Wednesday 5/28.   GI bleeding Patient developed a mix of black and red stool with abdominal pain. He was [facility] to the [facility]. Serial  H&H remained stable. CTA GI bleed showed no localized GI bleeding with pancolonic diverticulosis. GI was not consulted. Patient was instructed to hold aspirin  and coumadin . Patient is not interested in following up with GI as an outpatient.  - Monitor stool for further bleeding.  - Discontinue aspirin  and restart coumadin  as above.   Hypertension BP today 126/77. Patient has not been taking amlodipine since visit in November 2024.  - Continue to monitor.   Hyperlipidemia LDL 60 17 April 2023, at goal. - Continue simvastatin .  Lower extremity edema Patient reports he is no longer having lower extremity edema. He decided to stop taking Lasix  a couple of months ago. No edema today.  - Continue to hold Lasix . Contact office with recurrent edema.   Disposition: Stop aspirin . Restart coumadin . Follow up with coumadin  clinic on 5/28. Return in 3 months or sooner as needed.          Signed, Lonell Rives. Torin Whisner, DNP, NP-C

## 2023-08-09 ENCOUNTER — Other Ambulatory Visit: Payer: Self-pay

## 2023-08-09 DIAGNOSIS — Z5181 Encounter for therapeutic drug level monitoring: Secondary | ICD-10-CM

## 2023-08-09 DIAGNOSIS — I482 Chronic atrial fibrillation, unspecified: Secondary | ICD-10-CM

## 2023-08-11 ENCOUNTER — Encounter

## 2023-08-11 ENCOUNTER — Ambulatory Visit: Attending: Internal Medicine

## 2023-08-11 DIAGNOSIS — I482 Chronic atrial fibrillation, unspecified: Secondary | ICD-10-CM | POA: Diagnosis not present

## 2023-08-11 DIAGNOSIS — Z5181 Encounter for therapeutic drug level monitoring: Secondary | ICD-10-CM

## 2023-08-11 DIAGNOSIS — I4891 Unspecified atrial fibrillation: Secondary | ICD-10-CM | POA: Diagnosis not present

## 2023-08-11 DIAGNOSIS — Z7901 Long term (current) use of anticoagulants: Secondary | ICD-10-CM | POA: Diagnosis not present

## 2023-08-11 LAB — POCT INR: INR: 1.4 — AB (ref 2.0–3.0)

## 2023-08-11 NOTE — Patient Instructions (Signed)
 Take 1.5 tablets today only then continue taking 1 tablet daily.  INR in 2 weeks. [phone]  A full discussion of the nature of anticoagulants has been carried out.  A benefit risk analysis has been presented to the patient, so that they understand the justification for choosing anticoagulation at this time. The need for frequent and regular monitoring, precise dosage adjustment and compliance is stressed.  Side effects of potential bleeding are discussed.  The patient should avoid any OTC items containing aspirin  or ibuprofen, and should avoid great swings in general diet.  Avoid alcohol consumption.  Call if any signs of abnormal bleeding.

## 2023-08-25 ENCOUNTER — Ambulatory Visit: Attending: Cardiovascular Disease

## 2023-08-25 DIAGNOSIS — Z5181 Encounter for therapeutic drug level monitoring: Secondary | ICD-10-CM

## 2023-08-25 DIAGNOSIS — Z7901 Long term (current) use of anticoagulants: Secondary | ICD-10-CM

## 2023-08-25 DIAGNOSIS — I482 Chronic atrial fibrillation, unspecified: Secondary | ICD-10-CM | POA: Diagnosis not present

## 2023-08-25 LAB — POCT INR: INR: 2.6 (ref 2.0–3.0)

## 2023-08-25 NOTE — Patient Instructions (Signed)
 continue taking 1 tablet daily.  INR in 5 weeks. [phone]

## 2023-09-10 ENCOUNTER — Telehealth: Payer: Self-pay | Admitting: Cardiovascular Disease

## 2023-09-10 ENCOUNTER — Other Ambulatory Visit: Payer: Self-pay

## 2023-09-10 MED ORDER — WARFARIN SODIUM 2.5 MG PO TABS
ORAL_TABLET | ORAL | 1 refills | Status: DC
Start: 2023-09-10 — End: 2024-01-26

## 2023-09-10 NOTE — Telephone Encounter (Signed)
*  STAT* If patient is at the pharmacy, call can be transferred to refill team.   1. Which medications need to be refilled? (please list name of each medication and dose if known) warfarin (COUMADIN ) 2.5 MG tablet    2. Would you like to learn more about the convenience, safety, & potential cost savings by using the [facility]?      3. Are you open to using the [facility] (Type [facility].  ).   4. Which pharmacy/location (including street and city if local pharmacy) is medication to be sent to? [facility] - [place], [place] - [address]    5. Do they need a 30 day or 90 day supply? 90 day

## 2023-09-29 ENCOUNTER — Ambulatory Visit: Attending: Cardiovascular Disease

## 2023-09-29 DIAGNOSIS — Z5181 Encounter for therapeutic drug level monitoring: Secondary | ICD-10-CM | POA: Diagnosis not present

## 2023-09-29 DIAGNOSIS — I482 Chronic atrial fibrillation, unspecified: Secondary | ICD-10-CM | POA: Diagnosis not present

## 2023-09-29 DIAGNOSIS — Z7901 Long term (current) use of anticoagulants: Secondary | ICD-10-CM | POA: Diagnosis not present

## 2023-09-29 LAB — POCT INR: INR: 2.1 (ref 2.0–3.0)

## 2023-09-29 NOTE — Patient Instructions (Signed)
 continue taking 1 tablet daily.  INR in 6 weeks. [phone]

## 2023-10-04 DIAGNOSIS — N183 Chronic kidney disease, stage 3 unspecified: Secondary | ICD-10-CM | POA: Diagnosis not present

## 2023-10-04 DIAGNOSIS — I2510 Atherosclerotic heart disease of native coronary artery without angina pectoris: Secondary | ICD-10-CM | POA: Diagnosis not present

## 2023-10-04 DIAGNOSIS — R7303 Prediabetes: Secondary | ICD-10-CM | POA: Diagnosis not present

## 2023-10-04 DIAGNOSIS — I482 Chronic atrial fibrillation, unspecified: Secondary | ICD-10-CM | POA: Diagnosis not present

## 2023-10-04 DIAGNOSIS — E782 Mixed hyperlipidemia: Secondary | ICD-10-CM | POA: Diagnosis not present

## 2023-10-04 DIAGNOSIS — E039 Hypothyroidism, unspecified: Secondary | ICD-10-CM | POA: Diagnosis not present

## 2023-10-04 DIAGNOSIS — I129 Hypertensive chronic kidney disease with stage 1 through stage 4 chronic kidney disease, or unspecified chronic kidney disease: Secondary | ICD-10-CM | POA: Diagnosis not present

## 2023-11-04 DIAGNOSIS — I482 Chronic atrial fibrillation, unspecified: Secondary | ICD-10-CM | POA: Diagnosis not present

## 2023-11-04 DIAGNOSIS — N183 Chronic kidney disease, stage 3 unspecified: Secondary | ICD-10-CM | POA: Diagnosis not present

## 2023-11-04 DIAGNOSIS — E039 Hypothyroidism, unspecified: Secondary | ICD-10-CM | POA: Diagnosis not present

## 2023-11-04 DIAGNOSIS — I129 Hypertensive chronic kidney disease with stage 1 through stage 4 chronic kidney disease, or unspecified chronic kidney disease: Secondary | ICD-10-CM | POA: Diagnosis not present

## 2023-11-04 DIAGNOSIS — R7303 Prediabetes: Secondary | ICD-10-CM | POA: Diagnosis not present

## 2023-11-06 NOTE — Progress Notes (Addendum)
 Cardiology Office Note  Date:  11/08/2023   ID:  Carl Coffey, Carl Coffey 29-Feb-1922, MRN [id number]  PCP:  Lenon Layman ORN, MD   Chief Complaint  Patient presents with   3 month follow up     Patient denies chest pain or shortness of breath.     HPI:  [name] is a 89 year old gentleman with past medical hx  of Permanent atrial fibrillation treated with rate control and anticoagulation Coronary artery disease, history of CABG x3 April 96 Essential hypertension Hyperlipidemia Peripheral edema Previously followed by Dr. Bosie last seen March 2023 Who presents for f/u of his coronary disease and permanent atrial fibrillation  Last seen by myself in clinic 11/24 Seen by one of our providers 5/25  In follow-up today he reports doing well Takes care of wife who is 41 years old with health issues,  she cannot stand, has dementia  He denies significant shortness of breath, no chest pain on exertion, no significant leg edema  In the hospital May 2025 lower GI bleed He received vitamin K, H&H stable No further bleeding since that time he reports  In terms of his atrial fibrillation he reports he is asymptomatic, he has not required medications for rate control  emergency room December 20, 2022,  diagnosed with COVID   Lab work reviewed A1c 5.9 Total cholesterol 120, LDL 50 TSH 1.65 Normal BMP, CBC, troponin  A-fib dating back many years, on EKG March 2017 Tolerating warfarin  EKG personally reviewed by myself on todays visit EKG Interpretation Date/Time:  Monday November 08 2023 11:42:44 EDT Ventricular Rate:  54 PR Interval:    QRS Duration:  148 QT Interval:  452 QTC Calculation: 428 R Axis:   -66  Text Interpretation: Atrial fibrillation with slow ventricular response Right bundle branch block Left anterior fascicular block Bifascicular block T wave abnormality, consider lateral ischemia When compared with ECG of 03-Aug-2023 18:21, T wave inversion now evident in  Inferior leads T wave inversion less evident in Lateral leads QT has shortened Confirmed by Perla Lye [phone]) on 11/08/2023 11:59:39 AM    PMH:   has a past medical history of A-fib (HCC), Basal cell carcinoma (10/13/2017), Basal cell carcinoma (10/09/2020), Basal cell carcinoma (10/08/2021), Basal cell carcinoma (04/13/2022), Coronary artery disease, Dysplastic nevus (02/10/2016), Hypertension, Hypothyroidism, Myocardial infarction (HCC), Squamous cell carcinoma of skin (07/26/2007), and Thyroid  disease.  PSH:    Past Surgical History:  Procedure Laterality Date   CARDIAC SURGERY  1995   CABG x 3   CATARACT EXTRACTION, BILATERAL     CORONARY ARTERY BYPASS GRAFT  1995   triple   CYSTOSCOPY W/ RETROGRADES Bilateral 02/20/2019   Procedure: CYSTOSCOPY WITH RETROGRADE PYELOGRAM;  Surgeon: Penne Knee, MD;  Location: [facility] OR[facility];  Service: Urology;  Laterality: Bilateral;   HERNIA REPAIR Bilateral    MEATOTOMY N/A 02/20/2019   Procedure: MEATOTOMY ADULT;  Surgeon: Penne Knee, MD;  Location: [facility] OR[facility];  Service: Urology;  Laterality: N/A;   TRANSURETHRAL RESECTION OF BLADDER TUMOR WITH MITOMYCIN -C N/A 02/20/2019   Procedure: TRANSURETHRAL RESECTION OF BLADDER TUMOR WITH Gemcitabine ;  Surgeon: Penne Knee, MD;  Location: [facility] ORS;  Service: Urology;  Laterality: N/A;    Current Outpatient Medications  Medication Sig Dispense Refill   albuterol  (VENTOLIN  HFA) 108 (90 Base) MCG/ACT inhaler Inhale 2 puffs into the lungs every 4 (four) hours as needed. 8 g 0   amLODipine (NORVASC) 5 MG tablet Take 5 mg by mouth daily.     cyanocobalamin  (  VITAMIN B12) 1000 MCG tablet Take 1,000 mcg by mouth daily.     finasteride  (PROSCAR ) 5 MG tablet Take 5 mg by mouth daily.      furosemide  (LASIX ) 20 MG tablet Take 20 mg by mouth as needed for fluid or edema.     gabapentin  (NEURONTIN ) 100 MG capsule Take 100 mg by mouth at bedtime.     guaiFENesin  200 MG tablet Take 2 tablets (400 mg total) by mouth  every 6 (six) hours as needed for cough or to loosen phlegm. 30 tablet 0   ketoconazole  (NIZORAL ) 2 % shampoo APPLY 1 APPLICATION TOPICALLY 3 TIMES A WEEK, SHAMPOO SCALP 3 TIMES A WEEK, LET SIT FOR 5 MINUTES BEFORE RINSING OUT 120 mL 6   levothyroxine  (SYNTHROID , LEVOTHROID) 75 MCG tablet Take 75 mcg by mouth daily before breakfast.      simvastatin  (ZOCOR ) 10 MG tablet Take 10 mg by mouth at bedtime.     warfarin (COUMADIN ) 2.5 MG tablet Take 1-2 tablets daily or as prescribed by coumadin  clinic. 90 tablet 1   Current Facility-Administered Medications  Medication Dose Route Frequency Provider Last Rate Last Admin   gemcitabine  (GEMZAR ) 2,000 mg in sodium chloride  irrigation 0.9 % chemo infusion  2,000 mg Irrigation Once Brandon, Ashley, MD       gemcitabine  (GEMZAR ) 2,000 mg in sodium chloride  irrigation 0.9 % chemo infusion  2,000 mg Irrigation Once Brandon, Ashley, MD        Allergies:   Flexeril [cyclobenzaprine]   Social History:  The patient  reports that he has never smoked. He has never been exposed to tobacco smoke. He has never used smokeless tobacco. He reports that he does not drink alcohol and does not use drugs.   Family History:   family history is not on file.    Review of Systems: Review of Systems  Constitutional: Negative.   HENT: Negative.    Respiratory: Negative.    Cardiovascular: Negative.   Gastrointestinal: Negative.   Musculoskeletal: Negative.   Neurological: Negative.   Psychiatric/Behavioral: Negative.    All other systems reviewed and are negative.   PHYSICAL EXAM: VS:  BP 120/60 (BP Location: Left Arm, Patient Position: Sitting, Cuff Size: Normal)   Pulse (!) 54   Ht 5' 8 (1.727 m)   Wt 177 lb 6 oz (80.5 kg)   SpO2 97%   BMI 26.97 kg/m  , BMI Body mass index is 26.97 kg/m. Constitutional:  oriented to person, place, and time. No distress.  HENT:  Head: Grossly normal Eyes:  no discharge. No scleral icterus.  Neck: No JVD, no carotid bruits   Cardiovascular: Irregularly irregular, no murmurs appreciated Pulmonary/Chest: Clear to auscultation bilaterally, no wheezes or rails Abdominal: Soft.  no distension.  no tenderness.  Musculoskeletal: Normal range of motion Neurological:  normal muscle tone. Coordination normal. No atrophy Skin: Skin warm and dry Psychiatric: normal affect, pleasant  Recent Labs: 12/20/2022: B Natriuretic Peptide 150.5 08/03/2023: ALT 16 08/05/2023: BUN 22; Creatinine, Ser 0.92; Hemoglobin 13.2; Magnesium 2.1; Platelets 187; Potassium 3.6; Sodium 140   Lipid Panel No results found for: CHOL, HDL, LDLCALC, TRIG    Wt Readings from Last 3 Encounters:  11/08/23 177 lb 6 oz (80.5 kg)  08/06/23 177 lb (80.3 kg)  08/03/23 180 lb (81.6 kg)     ASSESSMENT AND PLAN:  Problem List Items Addressed This Visit       Cardiology Problems   Chronic atrial fibrillation (HCC) - Primary   Relevant Orders  EKG 12-Lead (Completed)   Other Visit Diagnoses       Coronary artery disease involving coronary bypass graft of native heart without angina pectoris       Relevant Orders   EKG 12-Lead (Completed)     Primary hypertension       Relevant Orders   EKG 12-Lead (Completed)     Hyperlipidemia LDL goal <70         Bilateral lower extremity edema           Coronary artery disease with stable angina, history of CABG Currently with no symptoms of angina. No further workup at this time. Continue current medication regimen.  Hyperlipidemia Cholesterol well-controlled on simvastatin   Permanent atrial fibrillation Metoprolol previously held for hypotension Tolerating warfarin, managed by primary care Asymptomatic, rate controlled  Essential hypertension Tolerating amlodipine 5 mg daily, Lasix  as needed  Ankle swelling On Lasix  20 as needed Minimal ankle swelling on today's visit Stable BMP   Signed, Velinda Lunger, M.D., Ph.D. [facility] [place], [place] [phone]

## 2023-11-08 ENCOUNTER — Encounter: Payer: Self-pay | Admitting: Cardiovascular Disease

## 2023-11-08 ENCOUNTER — Ambulatory Visit: Attending: Cardiovascular Disease | Admitting: Cardiovascular Disease

## 2023-11-08 VITALS — BP 120/60 | HR 54 | Ht 68.0 in | Wt 177.4 lb

## 2023-11-08 DIAGNOSIS — I25810 Atherosclerosis of coronary artery bypass graft(s) without angina pectoris: Secondary | ICD-10-CM

## 2023-11-08 DIAGNOSIS — E785 Hyperlipidemia, unspecified: Secondary | ICD-10-CM | POA: Diagnosis not present

## 2023-11-08 DIAGNOSIS — I482 Chronic atrial fibrillation, unspecified: Secondary | ICD-10-CM

## 2023-11-08 DIAGNOSIS — R600 Localized edema: Secondary | ICD-10-CM

## 2023-11-08 DIAGNOSIS — I10 Essential (primary) hypertension: Secondary | ICD-10-CM

## 2023-11-08 NOTE — Patient Instructions (Addendum)

## 2023-11-09 ENCOUNTER — Ambulatory Visit: Admitting: Urology

## 2023-11-09 ENCOUNTER — Other Ambulatory Visit: Payer: PPO | Admitting: Urology

## 2023-11-09 VITALS — BP 187/88 | HR 78 | Ht 68.0 in | Wt 175.0 lb

## 2023-11-09 DIAGNOSIS — Z8551 Personal history of malignant neoplasm of bladder: Secondary | ICD-10-CM | POA: Diagnosis not present

## 2023-11-09 LAB — URINALYSIS, COMPLETE
Bilirubin, UA: NEGATIVE
Glucose, UA: NEGATIVE
Ketones, UA: NEGATIVE
Leukocytes,UA: NEGATIVE
Nitrite, UA: NEGATIVE
RBC, UA: NEGATIVE
Specific Gravity, UA: 1.025 (ref 1.005–1.030)
Urobilinogen, Ur: 1 mg/dL (ref 0.2–1.0)
pH, UA: 6 (ref 5.0–7.5)

## 2023-11-09 LAB — MICROSCOPIC EXAMINATION

## 2023-11-09 NOTE — Progress Notes (Signed)
   11/09/23  CC:  Chief Complaint  Patient presents with   Cysto   Urologic history:  1.  Urothelial carcinoma bladder Previously followed by Dr. Penne TURBT 02/2019 high-grade Ta urothelial carcinoma; post op intravesical gemcitabine .  Declined BCG Office fulguration small papillary recurrence left bladder neck July 2022 Office fulguration multifocal recurrence 01/08/2022 Office fulguration 5 mm papillary recurrence 12/16/2022  HPI: 89 y.o. male presents for 37-month surveillance cystoscopy.  At his last cystoscopy 04/20/2023 felt to have a small papillary recurrence left lateral wall and it was elected to follow.  Denies gross hematuria.  Blood pressure (!) 187/88, pulse 78, height 5' 8 (1.727 m), weight 175 lb (79.4 kg).   Cystoscopy Procedure Note  Patient identification was confirmed, informed consent was obtained, and patient was prepped using Betadine solution.  Lidocaine  jelly was administered per urethral meatus.     Pre-Procedure: - Inspection reveals a normal caliber ureteral meatus.  Procedure: The flexible cystoscope was introduced without difficulty - No urethral strictures/lesions are present. - Prominent lateral lobe enlargement prostate  - Elevated bladder neck - Bladder mucosa  reveals high-grade appearing papillary tumors left lateral wall ~5 mm in a slightly carpeted area more low-grade in appearance - No bladder stones - Moderate trabeculation  Retroflexion shows intravesical median lobe/no tumor   Post-Procedure: - Patient tolerated the procedure well  Assessment/ Plan: Recurrent bladder tumor; 2 of the tumors are high-grade in appearance Findings were discussed and recommend scheduling cystoscopy under sedation with biopsy/fulguration The procedure was discussed in detail and he is in agreement with proceeding. Will obtain preoperative clearance     Carl JAYSON Barba, MD

## 2023-11-10 ENCOUNTER — Ambulatory Visit: Attending: Cardiovascular Disease

## 2023-11-10 DIAGNOSIS — Z7901 Long term (current) use of anticoagulants: Secondary | ICD-10-CM | POA: Diagnosis not present

## 2023-11-10 DIAGNOSIS — Z5181 Encounter for therapeutic drug level monitoring: Secondary | ICD-10-CM

## 2023-11-10 DIAGNOSIS — I482 Chronic atrial fibrillation, unspecified: Secondary | ICD-10-CM

## 2023-11-10 LAB — POCT INR: INR: 2.3 (ref 2.0–3.0)

## 2023-11-10 NOTE — Patient Instructions (Signed)
 continue taking 1 tablet daily.  INR in 8 weeks. [phone]

## 2023-11-24 DIAGNOSIS — H43813 Vitreous degeneration, bilateral: Secondary | ICD-10-CM | POA: Diagnosis not present

## 2023-11-24 DIAGNOSIS — M3501 Sicca syndrome with keratoconjunctivitis: Secondary | ICD-10-CM | POA: Diagnosis not present

## 2023-11-30 ENCOUNTER — Telehealth: Payer: Self-pay | Admitting: Urology

## 2023-11-30 DIAGNOSIS — C679 Malignant neoplasm of bladder, unspecified: Secondary | ICD-10-CM

## 2023-11-30 NOTE — Telephone Encounter (Signed)
 Pt saw [facility] 8/26 for cysto and was supposed to have surgery.  I see his last office note is requesting clearance.

## 2023-12-01 ENCOUNTER — Telehealth: Payer: Self-pay

## 2023-12-01 ENCOUNTER — Other Ambulatory Visit: Payer: Self-pay

## 2023-12-01 DIAGNOSIS — D494 Neoplasm of unspecified behavior of bladder: Secondary | ICD-10-CM

## 2023-12-01 NOTE — Telephone Encounter (Signed)
   Pre-operative Risk Assessment    Patient Name: Carl Coffey  DOB: [birth year] MRN: [id number]   Date of last office visit: 11/08/23 EVALENE LUNGER, MD Date of next office visit: NONE   Request for Surgical Clearance    Procedure:  CYSTOSCOPY WITH BLADDER BIOPSY AND INTRAVESICAL INSTILLATION OF GEMCITABINE   Date of Surgery:  Clearance 12/23/23                                Surgeon:  DR GLENDIA BARBA Surgeon's Group or Practice Name:  [facility] Phone number:  [phone] Fax number:  [phone]   Type of Clearance Requested:   - Medical  - Pharmacy:  Hold Warfarin (Coumadin )     Type of Anesthesia:  CHOICE   Additional requests/questions:    Bonney Lucie DELENA Alvia   12/01/2023, 4:41 PM

## 2023-12-01 NOTE — Progress Notes (Signed)
  Phone Number: [phone] for Surgical Coordinator Fax Number: [phone]  REQUEST FOR SURGICAL CLEARANCE      Date: Date: 12/01/23  Faxed to: Dr. Lucio Care  Surgeon: Dr. Glendia Barba, MD     Date of Surgery: 12/23/2023  Operation: Cystoscopy with Bladder Biopsy and Intravesical Instillation of Gemcitabine    Anesthesia Type: Choice   Diagnosis: Bladder Tumor  Patient Requires:   Cardiac / Vascular Clearance : Yes  Reason: Would like for patient to hold Warfarin prior to procedure.    Risk Assessment:    Low   []       Moderate   []     High   []           This patient is optimized for surgery  YES []       NO   []    I recommend further assessment/workup prior to surgery. YES []      NO  []   Appointment scheduled for: _______________________   Further recommendations: ____________________________________     Physician Signature:__________________________________   Printed Name: ________________________________________   Date: _________________

## 2023-12-01 NOTE — Progress Notes (Signed)
   [facility] Surgical Posting Form  Surgery Date: Date: 12/23/2023  Surgeon: Dr. Glendia Barba, MD  Inpt ( No  )   Outpt (Yes)   Obs ( No  )   Diagnosis: D49.4 Bladder Tumors  -CPT: 47795, [id number]  Surgery: Cystoscopy with Bladder Biopsy and Intravesical Instillation of Gemcitabine    Stop Anticoagulations: Yes, will need to hold Warfarin   Cardiac/Medical/Pulmonary Clearance needed: Yes  Clearance needed from Dr: [name]  Clearance request sent on: Date: 12/01/23  *Orders entered into EPIC  Date: 12/01/23   *Case booked in EPIC  Date: 12/01/23  *Notified pt of Surgery: Date: 12/01/23  PRE-OP UA & CX: yes, will obtain in clinic on 12/13/2023  *Placed into Prior Authorization Work [place] Date: 12/01/23  Assistant/laser/rep:No

## 2023-12-01 NOTE — Progress Notes (Signed)
 Surgical Physician Order Form [facility]  Dr. Glendia Barba, MD  * Scheduling expectation : Next Available  *Length of Case: 30 min  *Clearance needed: yes  *Anticoagulation Instructions: Hold all anticoagulants- warfarin  *Aspirin  Instructions: N/A  *Post-op visit Date/Instructions:  TBD  *Diagnosis: bladder tumors  *Procedure:  Cysto Bladder Biopsy (47795)   Additional orders: Gemcitabine  2000mg  bladder instillation  -Admit type: OUTpatient  -Anesthesia: Choice  -VTE Prophylaxis Standing Order SCD's       Other:   -Standing Lab Orders Per Anesthesia    Lab other: UA&Urine Culture  -Standing Test orders EKG/Chest x-ray per Anesthesia       Test other:   - Medications:  Ancef  2gm IV  -Other orders:  N/A

## 2023-12-01 NOTE — Telephone Encounter (Signed)
 Per Dr. Twylla, Patient is to be scheduled for Cystoscopy with Bladder Biopsy and Intravesical Instillation of Gemcitabine    Carl Coffey was contacted and possible surgical dates were discussed, Thursday October 9th, 2025 was agreed upon for surgery.   Patient was instructed that Dr. Twylla will require them to provide a pre-op UA & CX prior to surgery. This was ordered and scheduled drop off appointment was made for 12/13/2023.    Patient was directed to call [phone] between 1-3pm the day before surgery to find out surgical arrival time.  Instructions were given not to eat or drink from midnight on the night before surgery and have a driver for the day of surgery. On the surgery day patient was instructed to enter through the Medical Mall entrance of [facility] report the Same Day Surgery desk.   Pre-Admit Testing will be in contact via phone to set up an interview with the anesthesia team to review your history and medications prior to surgery.   Reminder of this information was sent via MyChart to the patient.

## 2023-12-06 NOTE — Telephone Encounter (Signed)
 Patient with diagnosis of atrial fibrillation on warfarin for anticoagulation.    Procedure: cystoscopy with bladder biopsy and intravesical instillation of gemcitabine  Date of procedure: 12/23/23   CHA2DS2-VASc Score = 4   This indicates a 4.8% annual risk of stroke. The patient's score is based upon: CHF History: 0 HTN History: 1 Diabetes History: 0 Stroke History: 0 Vascular Disease History: 1 Age Score: 2 Gender Score: 0      CrCl 48 (Scr 0.92, weight 79.4 Kg) Platelet count 187K (08/05/23)  Patient has not had an Afib/aflutter ablation or Watchman within the last 3 months or DCCV within the last 30 days   Per office protocol, patient can hold warfarin for 5 days prior to procedure.    Patient will not need bridging with Lovenox (enoxaparin) around procedure.  **This guidance is not considered finalized until pre-operative APP has relayed final recommendations.**

## 2023-12-07 ENCOUNTER — Telehealth: Payer: Self-pay

## 2023-12-07 NOTE — Telephone Encounter (Signed)
 Hi Dr. [name]. You saw this patient recently on 11/08/2023. Are you able to comment on surgical clearance for upcoming cystoscopy with bladder biopsy and intravesical instillation of gemcitabine  scheduled for 12/23/2023? Please route your response to P CV DIV PREOP. Thank you!  ~Socrates Cahoon

## 2023-12-07 NOTE — Telephone Encounter (Signed)
 Received Cardiac Clearance from [facility]. Patient to hold Warfarin for 5 days prior to surgery. Patient and Son Evalene were advised, last dosage will be on Friday October 3rd. Verbalized understanding.

## 2023-12-07 NOTE — Telephone Encounter (Signed)
   Patient Name: Carl Coffey  DOB: Feb 15, 1922 MRN: [id number]  Primary Cardiologist: Evalene Lunger, MD  Chart reviewed as part of pre-operative protocol coverage. Patient was recently seen by Dr. [name] on 12/09/2023. Per Dr. [name]: He would be at acceptable risk for procedure. Procedure is low risk.   Per pharmacy and office protocol: Patient can hold warfarin for 5 days prior to procedure. Patient will not need bridging with Lovenox (enoxaparin) around procedure.  I will route this recommendation to the requesting party via Epic fax function and remove from pre-op pool.  Please call with questions.  Ademola Vert E Olga Seyler, PA-C 12/07/2023, 8:35 AM

## 2023-12-13 ENCOUNTER — Other Ambulatory Visit

## 2023-12-13 DIAGNOSIS — D494 Neoplasm of unspecified behavior of bladder: Secondary | ICD-10-CM

## 2023-12-13 LAB — URINALYSIS, COMPLETE
Bilirubin, UA: NEGATIVE
Glucose, UA: NEGATIVE
Ketones, UA: NEGATIVE
Leukocytes,UA: NEGATIVE
Nitrite, UA: NEGATIVE
RBC, UA: NEGATIVE
Specific Gravity, UA: 1.025 (ref 1.005–1.030)
Urobilinogen, Ur: 1 mg/dL (ref 0.2–1.0)
pH, UA: 6 (ref 5.0–7.5)

## 2023-12-13 LAB — MICROSCOPIC EXAMINATION

## 2023-12-16 ENCOUNTER — Other Ambulatory Visit: Payer: Self-pay

## 2023-12-16 ENCOUNTER — Encounter: Payer: Self-pay | Admitting: Urology

## 2023-12-16 ENCOUNTER — Encounter
Admission: RE | Admit: 2023-12-16 | Discharge: 2023-12-16 | Disposition: A | Discharge status: Home / Self Care | Source: Ambulatory Visit | Attending: Urology | Admitting: Urology

## 2023-12-16 VITALS — Wt 177.5 lb

## 2023-12-16 DIAGNOSIS — Z01812 Encounter for preprocedural laboratory examination: Secondary | ICD-10-CM

## 2023-12-16 DIAGNOSIS — I482 Chronic atrial fibrillation, unspecified: Secondary | ICD-10-CM

## 2023-12-16 HISTORY — DX: Myoneural disorder, unspecified: G70.9

## 2023-12-16 LAB — CULTURE, URINE COMPREHENSIVE

## 2023-12-16 NOTE — Patient Instructions (Addendum)
 Your procedure is scheduled on: 12/23/2023 Thursday  Report to the Registration Desk on the 1st floor of the Medical Mall. To find out your arrival time, please call [phone] between 1PM - 3PM on: 10/8/ 2025  If your arrival time is 6:00 am, do not arrive before that time as the Medical Mall entrance doors do not open until 6:00 am.  REMEMBER: Instructions that are not followed completely may result in serious medical risk, up to and including death; or upon the discretion of your surgeon and anesthesiologist your surgery may need to be rescheduled.  Do not eat food after midnight the night before surgery.  No gum chewing or hard candies.   One week prior to surgery: Stop Anti-inflammatories (NSAIDS) such as Advil, Aleve, Ibuprofen, Motrin, Naproxen, Naprosyn and Aspirin  based products such as Excedrin, Goody's Powder, BC Powder. Stop ANY OVER THE COUNTER supplements until after surgery.  You may however, continue to take Tylenol  if needed for pain up until the day of surgery.   Follow recommendations regarding stopping blood thinners. You were instructed to hold warfarin 5 days before procedure. Last dose should be  Oct 05/2023 , Friday  Continue taking all of your other prescription medications up until the day of surgery.  ON THE DAY OF SURGERY ONLY TAKE THESE MEDICATIONS WITH SIPS OF WATER:  levothyroxine  (SYNTHROID , LEVOTHROID)     No Alcohol for 24 hours before or after surgery.  No Smoking including e-cigarettes for 24 hours before surgery.  No chewable tobacco products for at least 6 hours before surgery.  No nicotine patches on the day of surgery.  Do not use any recreational drugs for at least a week (preferably 2 weeks) before your surgery.  Please be advised that the combination of cocaine and anesthesia may have negative outcomes, up to and including death. If you test positive for cocaine, your surgery will be cancelled.  On the morning of surgery brush  your teeth with toothpaste and water, you may rinse your mouth with mouthwash if you wish. Do not swallow any toothpaste or mouthwash.  You need to shower on day of surgery.  Do not wear jewelry, make-up, hairpins, clips or nail polish.  Do not wear lotions, powders, or perfumes or deodorant.  Do not shave body hair from the neck down 48 hours before surgery.  Contact lenses, hearing aids and dentures may not be worn into surgery.  Do not bring valuables to the hospital. [facility] is not responsible for any missing/lost belongings or valuables.    Notify your doctor if there is any change in your medical condition (cold, fever, infection).  Wear comfortable clothing (specific to your surgery type) to the hospital.  After surgery, you can help prevent lung complications by doing breathing exercises.  Take deep breaths and cough every 1-2 hours. Your doctor may order a device called an Incentive Spirometer to help you take deep breaths.    If you are being discharged the day of surgery, you will not be allowed to drive home. You will need a responsible individual to drive you home and stay with you for 24 hours after surgery.    Please call the Pre-admissions Testing Dept. at [phone] if you have any questions about these instructions.  Surgery Visitation Policy:  Patients having surgery or a procedure may have two visitors.  Children under the age of 64 must have an adult with them who is not the patient.    Merchandiser, retail to  address health-related social needs:  [email or web address]

## 2023-12-20 ENCOUNTER — Encounter: Payer: Self-pay | Admitting: Urology

## 2023-12-21 ENCOUNTER — Encounter: Payer: Self-pay | Admitting: Urology

## 2023-12-21 NOTE — Progress Notes (Signed)
 Perioperative / Anesthesia Services  Pre-Admission Testing Clinical Review / Pre-Operative Anesthesia Consult  Date: 12/21/23  PATIENT DEMOGRAPHICS: Name: Carl Coffey DOB: 05-08-1921 MRN:   [id number]  Note: Available PAT nursing documentation and vital signs have been reviewed. Clinical nursing staff has updated patient's PMH/PSHx, current medication list, and drug allergies/intolerances to ensure complete and comprehensive history available to assist care teams in MDM as it pertains to the aforementioned surgical procedure and anticipated anesthetic course. Extensive review of available clinical information personally performed. Nursing documentation reviewed. [name] PMH and PSHx updated with any diagnoses and/or procedures that I have knowledge of that may have been inadvertently omitted during his intake with the pre-admission testing department's nursing staff.  PLANNED SURGICAL PROCEDURE(S):   Case: [id number] Date/Time: 12/23/23 0958   Procedures:      CYSTOSCOPY, WITH BIOPSY     INSTILLATION, BLADDER   Anesthesia type: Choice   Diagnosis: Bladder tumor [D49.4]   Pre-op diagnosis: Bladder Tumor   Location: [facility] OR ROOM 10 / [facility]   Surgeons: Twylla Glendia BROCKS, MD        CLINICAL DISCUSSION: Carl Coffey is a 89 y.o. male who is submitted for pre-surgical anesthesia review and clearance prior to him undergoing the above procedure. Patient has never been a smoker in the past. Pertinent PMH includes: CAD (s/p CABG),, anterolateral STEMI, permanent atrial fibrillation, cardiomegaly, aortic atherosclerosis, HTN, HLD, prediabetes, hypothyroidism, GI bleeding, bladder tumor, BPH.  Patient is followed by cardiology ([name], MD). He was last seen in the cardiology clinic on ***; notes reviewed. ***At the time of his clinic visit, patient doing well overall from a cardiovascular perspective. Patient denied any chest pain, shortness of breath, PND, orthopnea,  palpitations, significant peripheral edema, weakness, fatigue, vertiginous symptoms, or presyncope/syncope. Patient with a past medical history significant for cardiovascular diagnoses. Documented physical exam was grossly benign, providing no evidence of acute exacerbation and/or decompensation of the patient's known cardiovascular conditions.  ***  Blood pressure***controlled at *** mmHg on currently prescribed *** therapies.  Patient is on *** for his HLD diagnosis and ASCVD prevention. ***Patient is not diabetic. ***He does not have an OSAH diagnosis. ***FC. No changes were made to his medication regimen during his visit with cardiology.  Patient scheduled to follow-up with outpatient cardiology in***months or sooner if needed.  Carl Coffey is scheduled for an elective CYSTOSCOPY, WITH BIOPSY; INSTILLATION, BLADDER on 12/23/2023 with Dr. Glendia BROCKS Twylla, MD. Given patient's past medical history significant for cardiovascular diagnoses, presurgical cardiac clearance was sought by the PAT team. Per cardiology, based ACC/AHA guidelines, the patient's past medical history, and the amount of time since his last clinic visit, this patient would be at an overall ACCEPTABLE risk for the planned procedure without further cardiovascular testing or intervention at this time.   Again, this patient is on daily oral anticoagulation therapy. He has been instructed on recommendations for holding his warfarin for 5 days prior to his procedure with plans to restart as soon as postoperative bleeding risk felt to be minimized by his primary attending surgeon. The patient has been instructed that his last dose should be on 12/17/2023.  Cardiology advising the patient will not require enoxaparin bridging surrounding this procedure.  Patient denies previous perioperative complications with anesthesia in the past. In review his EMR, it is noted that patient underwent a general anesthetic course here at [facility] ([facility] in 02/2019 without documented complications.   MOST  RECENT VITAL SIGNS:    12/16/2023   12:58 PM 11/09/2023   10:15 AM 11/08/2023   11:34 AM  Vitals with BMI  Height  5' 8 5' 8  Weight 177 lbs 8 oz 175 lbs 177 lbs 6 oz  BMI  26.61 26.98  Systolic  187 120  Diastolic  88 60  Pulse  78 54   PROVIDERS/SPECIALISTS: NOTE: Primary physician provider listed below. Patient may have been seen by APP or partner within same practice.   PROVIDER ROLE / SPECIALTY LAST Carl Twylla Glendia JAYSON, MD Urology (Surgeon) 11/09/2023  Carl Coffey ORN, MD Primary Care Provider 10/04/2023  Carl Lye, MD Cardiology 11/08/2023; preop APP call 12/07/2023   ALLERGIES: Allergies  Allergen Reactions   Flexeril [Cyclobenzaprine] Other (See Comments)    Blacks out   CURRENT HOME MEDICATIONS:  gemcitabine  (GEMZAR ) 2,000 mg in sodium chloride  irrigation 0.9 % chemo infusion   gemcitabine  (GEMZAR ) 2,000 mg in sodium chloride  irrigation 0.9 % chemo infusion    acetaminophen  (TYLENOL ) 500 MG tablet   albuterol  (VENTOLIN  HFA) 108 (90 Base) MCG/ACT inhaler   cyanocobalamin  (VITAMIN B12) 1000 MCG tablet   finasteride  (PROSCAR ) 5 MG tablet   gabapentin  (NEURONTIN ) 100 MG capsule   levothyroxine  (SYNTHROID , LEVOTHROID) 75 MCG tablet   simvastatin  (ZOCOR ) 10 MG tablet   warfarin (COUMADIN ) 2.5 MG tablet   ketoconazole  (NIZORAL ) 2 % shampoo   HISTORY: Past Medical History:  Diagnosis Date   Acute ST elevation myocardial infarction (STEMI) of anterolateral wall (HCC) 06/18/1994   a.) Tx'd with tPA --> LHC (+) for multi-vessal CAD --> CVTS; b.) s/p 3v CABG 06/25/1994   Anticoagulated on warfarin    Aortic atherosclerosis    Basal cell carcinoma 10/13/2017   right ant neck   Basal cell carcinoma 10/09/2020   right dorsum hand - [facility]   Basal cell carcinoma 10/08/2021   Right mid dorsum nose. Nodular, ulcerated. [facility]   Basal cell carcinoma 04/13/2022   Right Dorsum  Nose, [facility]   Bilateral inguinal hernia    Bladder tumor    BPH (benign prostatic hyperplasia)    Cardiomegaly    Chilaiditi's syndrome (HCC)    Coronary artery disease    Dysplastic nevus 02/10/2016   right og midline epigastric   GI bleeding 07/2023   History of 2019 novel coronavirus disease (COVID-19) 12/20/2022   History of bilateral cataract extraction    HLD (hyperlipidemia)    Hypertension    Hypothyroidism    Pancolonic diverticulosis    Peripheral edema    Permanent atrial fibrillation (HCC)    a.) CHA2DS2VASc = 4 (age x2, HTN, vascular disease) as of 12/20/2023; b.) rate/rhythm maintained intrinsically without pharmacological intervention; chronically anticoagulated with warfarin   S/P CABG x 3 06/25/1994   a.) LIMA-LAD, SVG-OM1, SVG-PDA   Squamous cell carcinoma of skin 07/26/2007   right post earlobe (in situ)   Past Surgical History:  Procedure Laterality Date   CATARACT EXTRACTION, BILATERAL     CORONARY ARTERY BYPASS GRAFT N/A 06/25/1994   Procedure: CORONARY ARTERY BYPASS GRAFT; Location: [facility]; Surgeon: Lamar Moats, MD   CYSTOSCOPY W/ RETROGRADES Bilateral 02/20/2019   Procedure: CYSTOSCOPY WITH RETROGRADE PYELOGRAM;  Surgeon: Penne Knee, MD;  Location: [facility] ORS;  Service: Urology;  Laterality: Bilateral;   HERNIA REPAIR Bilateral    MEATOTOMY N/A 02/20/2019   Procedure: MEATOTOMY ADULT;  Surgeon: Penne Knee, MD;  Location: [facility] ORS;  Service: Urology;  Laterality: N/A;   TRANSURETHRAL RESECTION OF BLADDER TUMOR WITH  MITOMYCIN -C N/A 02/20/2019   Procedure: TRANSURETHRAL RESECTION OF BLADDER TUMOR WITH Gemcitabine ;  Surgeon: Penne Knee, MD;  Location: [facility] ORS;  Service: Urology;  Laterality: N/A;   No family history on file. Social History   Tobacco Use   Smoking status: Never    Passive exposure: Never   Smokeless tobacco: Never  Substance Use Topics   Alcohol use: Never   LABS:  Component Ref Range & Units 11/04/2023  WBC (White Blood  Cell Count) 4.1 - 10.2 10^3/uL 6.3  RBC (Red Blood Cell Count) 4.69 - 6.13 10^6/uL 4.45 Low   Hemoglobin 14.1 - 18.1 gm/dL 85.6  Hematocrit 59.9 - 52.0 % 42.5  MCV (Mean Corpuscular Volume) 80.0 - 100.0 fl 95.5  MCH (Mean Corpuscular Hemoglobin) 27.0 - 31.2 pg 32.1 High   MCHC (Mean Corpuscular Hemoglobin Concentration) 32.0 - 36.0 gm/dL 66.3  Platelet Count 849 - 450 10^3/uL 190  RDW-CV (Red Cell Distribution Width) 11.6 - 14.8 % 13.1  MPV (Mean Platelet Volume) 9.4 - 12.4 fl 10.0   Component Ref Range & Units 11/04/2023  Glucose 70 - 110 mg/dL 870 High   Sodium 863 - 145 mmol/L 142  Potassium 3.6 - 5.1 mmol/L 4.0  Chloride 97 - 109 mmol/L 106  Carbon Dioxide (CO2) 22.0 - 32.0 mmol/L 31.1  Urea Nitrogen (BUN) 7 - 25 mg/dL 23  Creatinine 0.7 - 1.3 mg/dL 1.0  Glomerular Filtration Rate (eGFR) >60 mL/min/1.73sq m 68  Calcium 8.7 - 10.3 mg/dL 9.2  AST 8 - 39 U/L 23  ALT 6 - 57 U/L 16  Alk Phos (alkaline Phosphatase) 34 - 104 U/L 36  Albumin 3.5 - 4.8 g/dL 4.0  Bilirubin, Total 0.3 - 1.2 mg/dL 1.6 High   Protein, Total 6.1 - 7.9 g/dL 5.9 Low   A/G Ratio 1.0 - 5.0 gm/dL 2.1   Component Ref Range & Units 11/04/2023  Hemoglobin A1C 4.2 - 5.6 % 5.8 High   Average Blood Glucose (Calc) mg/dL 879   Appointment on 90/70/7974  Component Date Value Ref Range Status   Urine Culture, Comprehensive 12/13/2023 Final report   Final   Organism ID, Bacteria 12/13/2023 Comment   Final   Comment: Mixed urogenital flora 25,000-50,000 colony forming units per mL    Specific Gravity, UA 12/13/2023 1.025  1.005 - 1.030 Final   pH, UA 12/13/2023 6.0  5.0 - 7.5 Final   Color, UA 12/13/2023 Yellow  Yellow Final   Appearance Ur 12/13/2023 Clear  Clear Final   Leukocytes,UA 12/13/2023 Negative  Negative Final   Protein,UA 12/13/2023 Trace  Negative/Trace Final   Glucose, UA 12/13/2023 Negative  Negative Final   Ketones, UA 12/13/2023 Negative  Negative Final   RBC, UA  12/13/2023 Negative  Negative Final   Bilirubin, UA 12/13/2023 Negative  Negative Final   Urobilinogen, Ur 12/13/2023 1.0  0.2 - 1.0 mg/dL Final   Nitrite, UA 90/70/7974 Negative  Negative Final   Microscopic Examination 12/13/2023 Comment   Final   Microscopic follows if indicated.   Microscopic Examination 12/13/2023 See below:   Final   Microscopic was indicated and was performed.   WBC, UA 12/13/2023 0-5  0 - 5 /hpf Final   RBC, Urine 12/13/2023 0-2  0 - 2 /hpf Final   Epithelial Cells (non renal) 12/13/2023 0-10  0 - 10 /hpf Final   Casts 12/13/2023 Present (A)  None seen /lpf Final   Cast Type 12/13/2023 Hyaline casts  N/A Final   Mucus, UA 12/13/2023 Present (A)  Not Estab. Final   Bacteria, UA 12/13/2023 Few  None seen/Few Final    ECG: Date: 11/08/2023  Time ECG obtained: 1142 AM Rate: 54 bpm Rhythm: Atrial fibrillation with slow ventricular response; bifascicular block (RBBB + LAFB) Axis (leads I and aVF): normal Intervals: QRS 148 ms. QTc 428 ms. ST segment and T wave changes: Anterolateral T wave inversions in leads V2-V6.  Evidence of a possible, age undetermined, prior infarct:  No Comparison: T wave changes new when compared to previous tracing obtained on 08/03/2023.  Atrial fibrillation present.   IMAGING / PROCEDURES: CT ANGIO GI BLEED performed on 08/04/2023 No intraluminal contrast extravasation or other findings to specifically localize GI bleeding. Pancolonic diverticulosis without evidence of acute diverticulitis. Cholelithiasis without evidence of acute cholecystitis. Prostatomegaly. Cardiomegaly and coronary artery disease. Aortic atherosclerosis  CT ANGIO CHEST PE W AND/OR WO CONTRAST performed on 12/14/2021 No evidence of pulmonary embolism. No evidence of acute cardiopulmonary disease. Aortic atherosclerosis  IMPRESSION AND PLAN: Carl Coffey has been referred for pre-anesthesia review and clearance prior to him undergoing the planned anesthetic  and procedural courses. Available labs, pertinent testing, and imaging results were personally reviewed by me in preparation for upcoming operative/procedural course. Carl Coffey has been updated following extensive Coffey review and patient interview with PAT staff.   ATTENTION --> PENDING CLEARANCE AT THIS TIME -- NOTE/CONTENTS NOT FINAL UNTIL SIGNED This patient has been appropriately cleared by cardiology with an overall *** risk of patient experiencing significant perioperative cardiovascular complications. Based on clinical review performed today (12/21/23), barring any significant acute changes in the patient's overall condition, it is anticipated that he will be able to proceed with the planned surgical intervention. Any acute changes in clinical condition may necessitate his procedure being postponed and/or cancelled. Patient will meet with anesthesia team (MD and/or CRNA) on the day of his procedure for preoperative evaluation/assessment. Questions regarding anesthetic course will be fielded at that time.   Pre-surgical instructions were reviewed with the patient during his PAT appointment, and questions were fielded to satisfaction by PAT clinical staff. He has been instructed on which medications that he will need to hold prior to surgery, as well as the ones that have been deemed safe/appropriate to take on the day of his procedure. As part of the general education provided by PAT, patient made aware both verbally and in writing, that he would need to abstain from the use of any illegal substances during his perioperative course. He was advised that failure to follow the provided instructions could necessitate case cancellation or result in serious perioperative complications up to and including death. Patient encouraged to contact PAT and/or his surgeon's office to discuss any questions or concerns that may arise prior to surgery; verbalized understanding.   Dorise Pereyra, MSN, APRN,  FNP-C, CEN [facility]  Perioperative Services Nurse Practitioner Phone: [phone] Fax: [phone] 12/21/23 8:17 AM  NOTE: This note has been prepared using Dragon dictation software. Despite my best ability to proofread, there is always the potential that unintentional transcriptional errors may still occur from this process.

## 2023-12-22 ENCOUNTER — Encounter: Payer: Self-pay | Admitting: Urology

## 2023-12-22 ENCOUNTER — Other Ambulatory Visit: Payer: Self-pay | Admitting: Dermatology

## 2023-12-22 DIAGNOSIS — L219 Seborrheic dermatitis, unspecified: Secondary | ICD-10-CM

## 2023-12-22 MED ORDER — LACTATED RINGERS IV SOLN: INTRAVENOUS | Status: DC

## 2023-12-22 MED ORDER — CHLORHEXIDINE GLUCONATE 0.12 % MT SOLN
15.0000 mL | Freq: Once | OROMUCOSAL | Status: AC
  Administered 2023-12-23: 15 mL via OROMUCOSAL

## 2023-12-22 MED ORDER — ORAL CARE MOUTH RINSE: 15.0000 mL | Freq: Once | OROMUCOSAL | Status: AC

## 2023-12-22 MED ORDER — CEFAZOLIN SODIUM-DEXTROSE 2-4 GM/100ML-% IV SOLN
2.0000 g | INTRAVENOUS | Status: AC
  Administered 2023-12-23: 2 g via INTRAVENOUS

## 2023-12-22 MED ORDER — GEMCITABINE CHEMO FOR BLADDER INSTILLATION 2000 MG
2000.0000 mg | Freq: Once | INTRAVENOUS | Status: DC
  Filled 2023-12-22: qty 52.6

## 2023-12-23 ENCOUNTER — Ambulatory Visit
Admission: RE | Admit: 2023-12-23 | Discharge: 2023-12-23 | Disposition: A | Discharge status: Home / Self Care | Attending: Urology | Admitting: Urology

## 2023-12-23 ENCOUNTER — Encounter
Admission: RE | Disposition: A | Discharge status: Home / Self Care | Payer: Self-pay | Source: Home / Self Care | Attending: Urology

## 2023-12-23 ENCOUNTER — Ambulatory Visit: Payer: Self-pay | Admitting: Urgent Care

## 2023-12-23 ENCOUNTER — Encounter: Payer: Self-pay | Admitting: Urology

## 2023-12-23 ENCOUNTER — Other Ambulatory Visit: Payer: Self-pay

## 2023-12-23 DIAGNOSIS — Q433 Congenital malformations of intestinal fixation: Secondary | ICD-10-CM | POA: Diagnosis not present

## 2023-12-23 DIAGNOSIS — E039 Hypothyroidism, unspecified: Secondary | ICD-10-CM | POA: Diagnosis not present

## 2023-12-23 DIAGNOSIS — C679 Malignant neoplasm of bladder, unspecified: Secondary | ICD-10-CM

## 2023-12-23 DIAGNOSIS — I4891 Unspecified atrial fibrillation: Secondary | ICD-10-CM | POA: Diagnosis not present

## 2023-12-23 DIAGNOSIS — Z951 Presence of aortocoronary bypass graft: Secondary | ICD-10-CM | POA: Insufficient documentation

## 2023-12-23 DIAGNOSIS — Z7901 Long term (current) use of anticoagulants: Secondary | ICD-10-CM | POA: Insufficient documentation

## 2023-12-23 DIAGNOSIS — I10 Essential (primary) hypertension: Secondary | ICD-10-CM | POA: Diagnosis not present

## 2023-12-23 DIAGNOSIS — N35919 Unspecified urethral stricture, male, unspecified site: Secondary | ICD-10-CM | POA: Insufficient documentation

## 2023-12-23 DIAGNOSIS — I700 Atherosclerosis of aorta: Secondary | ICD-10-CM | POA: Diagnosis not present

## 2023-12-23 DIAGNOSIS — I2510 Atherosclerotic heart disease of native coronary artery without angina pectoris: Secondary | ICD-10-CM | POA: Diagnosis not present

## 2023-12-23 DIAGNOSIS — R7303 Prediabetes: Secondary | ICD-10-CM | POA: Insufficient documentation

## 2023-12-23 DIAGNOSIS — N400 Enlarged prostate without lower urinary tract symptoms: Secondary | ICD-10-CM | POA: Insufficient documentation

## 2023-12-23 DIAGNOSIS — D494 Neoplasm of unspecified behavior of bladder: Secondary | ICD-10-CM

## 2023-12-23 DIAGNOSIS — E785 Hyperlipidemia, unspecified: Secondary | ICD-10-CM | POA: Diagnosis not present

## 2023-12-23 DIAGNOSIS — I252 Old myocardial infarction: Secondary | ICD-10-CM | POA: Diagnosis not present

## 2023-12-23 DIAGNOSIS — I4821 Permanent atrial fibrillation: Secondary | ICD-10-CM | POA: Diagnosis not present

## 2023-12-23 DIAGNOSIS — D090 Carcinoma in situ of bladder: Secondary | ICD-10-CM | POA: Insufficient documentation

## 2023-12-23 DIAGNOSIS — I482 Chronic atrial fibrillation, unspecified: Secondary | ICD-10-CM

## 2023-12-23 DIAGNOSIS — Z01812 Encounter for preprocedural laboratory examination: Secondary | ICD-10-CM

## 2023-12-23 HISTORY — DX: Calculus of gallbladder without cholecystitis without obstruction: K80.20

## 2023-12-23 HISTORY — PX: BLADDER INSTILLATION: SHX6893

## 2023-12-23 HISTORY — DX: Diverticulosis of large intestine without perforation or abscess without bleeding: K57.30

## 2023-12-23 HISTORY — DX: Bilateral inguinal hernia, without obstruction or gangrene, not specified as recurrent: K40.20

## 2023-12-23 HISTORY — DX: Permanent atrial fibrillation: I48.21

## 2023-12-23 HISTORY — DX: Atherosclerosis of aorta: I70.0

## 2023-12-23 HISTORY — DX: Long term (current) use of anticoagulants: Z79.01

## 2023-12-23 HISTORY — DX: Hyperlipidemia, unspecified: E78.5

## 2023-12-23 HISTORY — DX: Localized edema: R60.0

## 2023-12-23 HISTORY — DX: Neoplasm of unspecified behavior of bladder: D49.4

## 2023-12-23 HISTORY — DX: Prediabetes: R73.03

## 2023-12-23 HISTORY — DX: Cardiomegaly: I51.7

## 2023-12-23 HISTORY — DX: Cataract extraction status, right eye: Z98.42

## 2023-12-23 HISTORY — DX: Congenital malformations of intestinal fixation: Q43.3

## 2023-12-23 HISTORY — DX: Benign prostatic hyperplasia without lower urinary tract symptoms: N40.0

## 2023-12-23 HISTORY — DX: Cataract extraction status, right eye: Z98.41

## 2023-12-23 HISTORY — PX: CYSTOSCOPY WITH BIOPSY: SHX5122

## 2023-12-23 LAB — PROTIME-INR
INR: 1.1 (ref 0.8–1.2)
Prothrombin Time: 15.3 s — ABNORMAL HIGH (ref 11.4–15.2)

## 2023-12-23 SURGERY — CYSTOSCOPY, WITH BIOPSY
Anesthesia: Monitor Anesthesia Care | Site: Bladder

## 2023-12-23 MED ORDER — HYDRALAZINE HCL 20 MG/ML IJ SOLN
5.0000 mg | Freq: Once | INTRAMUSCULAR | Status: AC
  Administered 2023-12-23: 5 mg via INTRAVENOUS

## 2023-12-23 MED ORDER — CEFAZOLIN SODIUM-DEXTROSE 2-4 GM/100ML-% IV SOLN
INTRAVENOUS | Status: AC
Start: 2023-12-23 — End: 2023-12-23
  Filled 2023-12-23: qty 100

## 2023-12-23 MED ORDER — PROPOFOL 500 MG/50ML IV EMUL
INTRAVENOUS | Status: DC | PRN
  Administered 2023-12-23: 100 ug/kg/min via INTRAVENOUS

## 2023-12-23 MED ORDER — FENTANYL CITRATE (PF) 100 MCG/2ML IJ SOLN
INTRAMUSCULAR | Status: DC | PRN
  Administered 2023-12-23: 12.5 ug via INTRAVENOUS

## 2023-12-23 MED ORDER — LIDOCAINE HCL URETHRAL/MUCOSAL 2 % EX GEL
CUTANEOUS | Status: DC | PRN
  Administered 2023-12-23: 1 via URETHRAL

## 2023-12-23 MED ORDER — STERILE WATER FOR IRRIGATION IR SOLN
Status: DC | PRN
  Administered 2023-12-23: 3000 mL
  Administered 2023-12-23: 500 mL

## 2023-12-23 MED ORDER — PROPOFOL 1000 MG/100ML IV EMUL
INTRAVENOUS | Status: AC
  Filled 2023-12-23: qty 100

## 2023-12-23 MED ORDER — CHLORHEXIDINE GLUCONATE 0.12 % MT SOLN
OROMUCOSAL | Status: AC
  Filled 2023-12-23: qty 15

## 2023-12-23 MED ORDER — LIDOCAINE HCL URETHRAL/MUCOSAL 2 % EX GEL
CUTANEOUS | Status: AC
  Filled 2023-12-23: qty 10

## 2023-12-23 MED ORDER — LIDOCAINE HCL (PF) 1 % IJ SOLN
INTRAMUSCULAR | Status: AC
  Filled 2023-12-23: qty 30

## 2023-12-23 MED ORDER — FENTANYL CITRATE (PF) 100 MCG/2ML IJ SOLN
INTRAMUSCULAR | Status: AC
  Filled 2023-12-23: qty 2

## 2023-12-23 MED ORDER — LIDOCAINE HCL 1 % IJ SOLN
INTRAMUSCULAR | Status: DC | PRN
  Administered 2023-12-23: 10 mL

## 2023-12-23 MED ORDER — PROPOFOL 10 MG/ML IV BOLUS
INTRAVENOUS | Status: DC | PRN
  Administered 2023-12-23: 50 mg via INTRAVENOUS

## 2023-12-23 MED ORDER — GEMCITABINE CHEMO FOR BLADDER INSTILLATION 2000 MG
INTRAVENOUS | Status: DC | PRN
  Administered 2023-12-23: 2000 mg via INTRAVESICAL

## 2023-12-23 MED ORDER — HYDRALAZINE HCL 20 MG/ML IJ SOLN
INTRAMUSCULAR | Status: AC
  Filled 2023-12-23: qty 1

## 2023-12-23 SURGICAL SUPPLY — 16 items
BAG DRAIN SIEMENS DORNER NS (MISCELLANEOUS) ×1 IMPLANT
BRUSH SCRUB EZ 1% IODOPHOR (MISCELLANEOUS) ×1 IMPLANT
DRSG TELFA 3X4 N-ADH STERILE (GAUZE/BANDAGES/DRESSINGS) ×1 IMPLANT
ELECTRODE REM PT RTRN 9FT ADLT (ELECTROSURGICAL) ×1 IMPLANT
GLOVE BIOGEL PI IND STRL 7.5 (GLOVE) ×1 IMPLANT
GOWN STRL REUS W/ TWL LRG LVL3 (GOWN DISPOSABLE) ×2 IMPLANT
GOWN STRL REUS W/ TWL XL LVL3 (GOWN DISPOSABLE) ×1 IMPLANT
KIT TURNOVER CYSTO (KITS) ×1 IMPLANT
NDL SAFETY ECLIP 18X1.5 (MISCELLANEOUS) ×1 IMPLANT
PACK CYSTO AR (MISCELLANEOUS) ×1 IMPLANT
SET CYSTO IRRIGATION (SET/KITS/TRAYS/PACK) ×1 IMPLANT
SOLN STERILE WATER 1000 ML (IV SOLUTION) ×1 IMPLANT
SOLN STERILE WATER BTL 1000 ML (IV SOLUTION) ×1 IMPLANT
SURGILUBE 2OZ TUBE FLIPTOP (MISCELLANEOUS) ×1 IMPLANT
WATER STERILE IRR 3000ML UROMA (IV SOLUTION) ×1 IMPLANT
WATER STERILE IRR 500ML POUR (IV SOLUTION) ×1 IMPLANT

## 2023-12-23 NOTE — Anesthesia Postprocedure Evaluation (Signed)
 Anesthesia Post Note  Patient: Carl Coffey  Procedure(s) Performed: CYSTOSCOPY, WITH BIOPSY (Bladder) INSTILLATION, BLADDER (Bladder)  Patient location during evaluation: PACU Anesthesia Type: General Level of consciousness: awake and alert Pain management: pain level controlled Vital Signs Assessment: post-procedure vital signs reviewed and stable Respiratory status: spontaneous breathing, nonlabored ventilation and respiratory function stable Cardiovascular status: blood pressure returned to baseline and stable Postop Assessment: no apparent nausea or vomiting Anesthetic complications: no   No notable events documented.   Last Vitals:  Vitals:   12/23/23 1046 12/23/23 1057  BP:  138/69  Pulse: 72 62  Resp: 16 18  Temp:  (!) 36.2 C  SpO2: 94% 98%    Last Pain:  Vitals:   12/23/23 1057  TempSrc: Temporal  PainSc: 0-No pain                 Camellia Merilee Louder

## 2023-12-23 NOTE — Op Note (Signed)
   Preoperative diagnosis:  Bladder tumors  Postoperative diagnosis:  Same  Procedure: Cystoscopy with bladder biopsy/fulguration Instillation intravesical gemcitabine   Surgeon: Glendia JAYSON Barba, MD  Anesthesia: MAC  Complications: None  Intraoperative findings: Cystoscopy: Wide caliber penile urethral stricture which did not impede passage of the 21 French cystoscope.  Moderate lateral lobe enlargement with hypervascularity.  Moderate median lobe occluding the outlet.  UOs normal-appearing with clear efflux.  2 papillary tumors measuring ~3 mm left lateral wall.  Just inferior was a 5 mm broad area of papillary change.  EBL: Minimal  Specimens:  Bladder tumor  Indication: Carl Coffey is a 89 y.o. male with a history of high-grade urothelial carcinoma of the bladder diagnosed in December 2020 by Dr. Penne.  He declined BCG.  He has had 3 previous office fulguration for papillary recurrence this without biopsy in July 2022, October 2023 and October 2024.  Recent cystoscopy with recurrent tumor.  After reviewing the management options for treatment, he elected to proceed with the above surgical procedure(s). We have discussed the potential benefits and risks of the procedure, side effects of the proposed treatment, the likelihood of the patient achieving the goals of the procedure, and any potential problems that might occur during the procedure or recuperation. Informed consent has been obtained.  Description of procedure:  The patient was taken to the operating room and deep sedation was obtained by anesthesia.  The patient was placed in the dorsal lithotomy position, prepped and draped in the usual sterile fashion, and preoperative antibiotics were administered. A preoperative time-out was performed.   A lidocaine  gel Uro-Jet was instilled per urethra.  A 21 French cystoscope with 30 lens was lubricated, placed per urethra and advanced into the bladder under direct vision with  findings as described above.  30 cc of a mixture of 10 cc 1% lidocaine  and 20 cc saline was instilled into the bladder through the cystoscope.  Rigid biopsy forceps were then placed in to the cystoscope sheath The previously noted smaller tumors were removed and toto with single bites.  A biopsy was obtained of the broad area papillary change.  All biopsy sites were fulgurated for hemostasis.  Additional papillary tissue was also fulgurated from the broader area of papillary change.  At the completion of procedure hemostasis was adequate and all visible tumor had been treated.  An 72F Foley catheter was placed to gravity drainage.  10 mL of sterile water was placed in the balloon.  The catheter tubing was clamped. 2000 mg of intravesical gemcitabine  was instilled to the bladder.  He was then transported to the PACU in stable condition.  The gemcitabine  was allowed to dwell in the PACU for 1 hour.  This was well-tolerated.  After 1 hour, the catheter was drained and removed.   Glendia JAYSON Barba, M.D.

## 2023-12-23 NOTE — H&P (Signed)
 Urology H&P  Urologic history:   1.  Urothelial carcinoma bladder Previously followed by Dr. Penne TURBT 02/2019 high-grade Ta urothelial carcinoma; post op intravesical gemcitabine .  Declined BCG Office fulguration small papillary recurrence left bladder neck July 2022 Office fulguration multifocal recurrence 01/08/2022 Office fulguration 5 mm papillary recurrence 12/16/2022   Assessment/Recommendations: Recurrent bladder tumor; 2 of the tumors are high-grade in appearance Findings were discussed and recommend scheduling cystoscopy under sedation with biopsy/fulguration  History of Present Illness: Carl Coffey is a 89 y.o. with the above urologic history.  On recent surveillance cystoscopy 11/09/2023 he was found to have high-grade appearing papillary tumors left lateral wall measuring ~5 mm in addition to a slightly carpeted adjacent area.  Past Medical History:  Diagnosis Date   Acute ST elevation myocardial infarction (STEMI) of anterolateral wall (HCC) 06/18/1994   a.) Tx'd with tPA --> LHC (+) for multi-vessel CAD --> CVTS; b.) s/p 3v CABG 06/25/1994   Anticoagulated on warfarin    Aortic atherosclerosis    Basal cell carcinoma 10/13/2017   right ant neck   Basal cell carcinoma 10/09/2020   right dorsum hand - [facility]   Basal cell carcinoma 10/08/2021   Right mid dorsum nose. Nodular, ulcerated. [facility]   Basal cell carcinoma 04/13/2022   Right Dorsum Nose, [facility]   Bilateral inguinal hernia    Bladder tumor    BPH (benign prostatic hyperplasia)    Cardiomegaly    Chilaiditi's syndrome (HCC)    Cholelithiasis    Coronary artery disease    Dysplastic nevus 02/10/2016   right og midline epigastric   GI bleeding 07/2023   History of 2019 novel coronavirus disease (COVID-19) 12/20/2022   History of bilateral cataract extraction    HLD (hyperlipidemia)    Hypertension    Hypothyroidism    Pancolonic diverticulosis    Peripheral edema    Permanent atrial fibrillation (HCC)     a.) CHA2DS2VASc = 4 (age x2, HTN, vascular disease) as of 12/20/2023; b.) rate/rhythm maintained intrinsically without pharmacological intervention; chronically anticoagulated with warfarin   Prediabetes    S/P CABG x 3 06/25/1994   a.) LIMA-LAD, SVG-OM1, SVG-PDA   Squamous cell carcinoma of skin 07/26/2007   right post earlobe (in situ)    Past Surgical History:  Procedure Laterality Date   CATARACT EXTRACTION, BILATERAL     CORONARY ARTERY BYPASS GRAFT N/A 06/25/1994   Procedure: CORONARY ARTERY BYPASS GRAFT; Location: [facility]; Surgeon: Lamar Moats, MD   CYSTOSCOPY W/ RETROGRADES Bilateral 02/20/2019   Procedure: CYSTOSCOPY WITH RETROGRADE PYELOGRAM;  Surgeon: Penne Knee, MD;  Location: [facility] ORS;  Service: Urology;  Laterality: Bilateral;   HERNIA REPAIR Bilateral    MEATOTOMY N/A 02/20/2019   Procedure: MEATOTOMY ADULT;  Surgeon: Penne Knee, MD;  Location: [facility] ORS;  Service: Urology;  Laterality: N/A;   TRANSURETHRAL RESECTION OF BLADDER TUMOR WITH MITOMYCIN -C N/A 02/20/2019   Procedure: TRANSURETHRAL RESECTION OF BLADDER TUMOR WITH Gemcitabine ;  Surgeon: Penne Knee, MD;  Location: [facility] ORS;  Service: Urology;  Laterality: N/A;    Home Medications:  Current Facility-Administered Medications for the 12/23/23 encounter [facility] Encounter)  Medication   gemcitabine  (GEMZAR ) 2,000 mg in sodium chloride  irrigation 0.9 % chemo infusion   gemcitabine  (GEMZAR ) 2,000 mg in sodium chloride  irrigation 0.9 % chemo infusion   Current Meds  Medication Sig   acetaminophen  (TYLENOL ) 500 MG tablet Take 500-1,000 mg by mouth every 6 (six) hours as needed (pain. (lower back/lower leg pain)).   albuterol  (VENTOLIN  HFA) 108 (  90 Base) MCG/ACT inhaler Inhale 2 puffs into the lungs every 4 (four) hours as needed.   cyanocobalamin  (VITAMIN B12) 1000 MCG tablet Take 1,000 mcg by mouth in the morning.   finasteride  (PROSCAR ) 5 MG tablet Take 5 mg by mouth every evening.   gabapentin   (NEURONTIN ) 100 MG capsule Take 100 mg by mouth at bedtime.   ketoconazole  (NIZORAL ) 2 % shampoo USE AS A SHAMPOO ON SCALP 3 TIMES WEEKLY. LET SIT FOR 5 MINUTES BEFORE RINSING.   levothyroxine  (SYNTHROID , LEVOTHROID) 75 MCG tablet Take 75 mcg by mouth daily before breakfast.    simvastatin  (ZOCOR ) 10 MG tablet Take 10 mg by mouth at bedtime.   warfarin (COUMADIN ) 2.5 MG tablet Take 1-2 tablets daily or as prescribed by coumadin  clinic. (Patient taking differently: Take 2.5 mg by mouth every evening.)    Allergies:  Allergies  Allergen Reactions   Flexeril [Cyclobenzaprine] Other (See Comments)    Blacks out    History reviewed. No pertinent family history.  Social History:  reports that he has never smoked. He has never been exposed to tobacco smoke. He has never used smokeless tobacco. He reports that he does not drink alcohol and does not use drugs.  ROS: A complete review of systems was performed.  All systems are negative except for pertinent findings as noted.  Physical Exam:  Vital signs in last 24 hours: Temp:  [97.2 F (36.2 C)] 97.2 F (36.2 C) (10/09 0746) Pulse Rate:  [74] 74 (10/09 0746) Resp:  [16] 16 (10/09 0746) BP: (180)/(102) 180/102 (10/09 0746) SpO2:  [98 %] 98 % (10/09 0746) Constitutional:  Alert and oriented, No acute distress HEENT: [place] AT, moist mucus membranes.  Trachea midline, no masses Respiratory: Normal respiratory effort Neurologic: Grossly intact, no focal deficits, moving all 4 extremities Psychiatric: Normal mood and affect    12/23/2023, 8:34 AM  Glendia Barba,  MD

## 2023-12-23 NOTE — Progress Notes (Signed)
 Unclampled foley per Dr. Twylla order.

## 2023-12-23 NOTE — Anesthesia Preprocedure Evaluation (Addendum)
 Anesthesia Evaluation  Patient identified by MRN, date of birth, ID band Patient awake    Reviewed: Allergy & Precautions, H&P , NPO status , Patient's Chart, lab work & pertinent test results  Airway Mallampati: II  TM Distance: >3 FB Neck ROM: full    Dental no notable dental hx.    Pulmonary  Chilaiditi syndrome   Pulmonary exam normal        Cardiovascular Exercise Tolerance: Poor hypertension, (-) angina + CAD, + Past MI and + CABG  (-) DOE Normal cardiovascular exam+ dysrhythmias Atrial Fibrillation (-) pacemaker     Neuro/Psych negative neurological ROS  negative psych ROS   GI/Hepatic negative GI ROS, Neg liver ROS,,,  Endo/Other  Hypothyroidism    Renal/GU negative Renal ROS  negative genitourinary   Musculoskeletal   Abdominal Normal abdominal exam  (+)   Peds  Hematology negative hematology ROS (+)   Anesthesia Other Findings Past Medical History: 06/18/1994: Acute ST elevation myocardial infarction (STEMI) of  anterolateral wall (HCC)     Comment:  a.) Tx'd with tPA --> LHC (+) for multi-vessel CAD -->               CVTS; b.) s/p 3v CABG 06/25/1994 No date: Anticoagulated on warfarin No date: Aortic atherosclerosis 10/13/2017: Basal cell carcinoma     Comment:  right ant neck 10/09/2020: Basal cell carcinoma     Comment:  right dorsum hand - EDC 10/08/2021: Basal cell carcinoma     Comment:  Right mid dorsum nose. Nodular, ulcerated. 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) No date: Cholelithiasis No date: Coronary artery disease 02/10/2016: Dysplastic nevus     Comment:  right og midline epigastric 07/2023: GI bleeding 12/20/2022: History of 2019 novel coronavirus disease (COVID-19) No date:  History of bilateral cataract extraction No date: HLD (hyperlipidemia) No date: Hypertension No date: Hypothyroidism No date: Pancolonic diverticulosis No date: Peripheral edema No date: Permanent atrial fibrillation [facility])     Comment:  a.) CHA2DS2VASc = 71 (age x2, HTN, vascular disease) as               of 12/20/2023; b.) rate/rhythm maintained intrinsically               without pharmacological intervention; chronically               anticoagulated with warfarin No date: Prediabetes 06/25/1994: S/P CABG x 3     Comment:  a.) LIMA-LAD, SVG-OM1, SVG-PDA 07/26/2007: Squamous cell carcinoma of skin     Comment:  right post earlobe (in situ)  Past Surgical History: No date: CATARACT EXTRACTION, BILATERAL 06/25/1994: CORONARY ARTERY BYPASS GRAFT; N/A     Comment:  Procedure: CORONARY ARTERY BYPASS GRAFT; Location: [facility];              Surgeon: Lamar Moats, MD 02/20/2019: PHYLLIS W/ RETROGRADES; Bilateral     Comment:  Procedure: CYSTOSCOPY WITH RETROGRADE PYELOGRAM;                Surgeon: Penne Knee, MD;  Location: [facility] OR[facility];                Service: Urology;  Laterality: Bilateral; No date: HERNIA REPAIR; Bilateral 02/20/2019: MEATOTOMY; N/A     Comment:  Procedure: MEATOTOMY ADULT;  Surgeon: Penne Knee,  MD;  Location: [facility];  Service: Urology;  Laterality:               N/A; 02/20/2019: TRANSURETHRAL RESECTION OF BLADDER TUMOR WITH MITOMYCIN - C; N/A     Comment:  Procedure: TRANSURETHRAL RESECTION OF BLADDER TUMOR WITH              Gemcitabine ;  Surgeon: Penne Knee, MD;  Location:               [facility];  Service: Urology;  Laterality: N/A;     Reproductive/Obstetrics negative OB ROS                              Anesthesia Physical Anesthesia Plan  ASA: 3  Anesthesia Plan: General   Post-op Pain Management: Minimal or no pain anticipated   Induction: Intravenous  PONV Risk Score and Plan: Propofol  infusion  and TIVA  Airway Management Planned: Natural Airway  Additional Equipment:   Intra-op Plan:   Post-operative Plan:   Informed Consent: I have reviewed the patients History and Physical, chart, labs and discussed the procedure including the risks, benefits and alternatives for the proposed anesthesia with the patient or authorized representative who has indicated his/her understanding and acceptance.     Dental Advisory Given  Plan Discussed with: CRNA and Surgeon  Anesthesia Plan Comments:          Anesthesia Quick Evaluation

## 2023-12-23 NOTE — Anesthesia Postprocedure Evaluation (Signed)
 Anesthesia Post Note  Patient: Carl Coffey  Procedure(s) Performed: CYSTOSCOPY, WITH BIOPSY (Bladder) INSTILLATION, BLADDER (Bladder)  Patient location during evaluation: PACU Anesthesia Type: MAC Level of consciousness: awake and alert Pain management: pain level controlled Vital Signs Assessment: post-procedure vital signs reviewed and stable Respiratory status: spontaneous breathing, nonlabored ventilation and respiratory function stable Cardiovascular status: blood pressure returned to baseline and stable Postop Assessment: no apparent nausea or vomiting Anesthetic complications: no   No notable events documented.   Last Vitals:  Vitals:   12/23/23 1046 12/23/23 1057  BP:  138/69  Pulse: 72 62  Resp: 16 18  Temp:  (!) 36.2 C  SpO2: 94% 98%    Last Pain:  Vitals:   12/23/23 1057  TempSrc: Temporal  PainSc: 0-No pain                 Camellia Merilee Louder

## 2023-12-23 NOTE — Transfer of Care (Signed)
 Immediate Anesthesia Transfer of Care Note  Patient: Carl Coffey  Procedure(s) Performed: CYSTOSCOPY, WITH BIOPSY (Bladder) INSTILLATION, BLADDER (Bladder)  Patient Location: PACU  Anesthesia Type:General  Level of Consciousness: drowsy  Airway & Oxygen Therapy: Patient Spontanous Breathing and Patient connected to face mask oxygen  Post-op Assessment: Report given to RN and Post -op Vital signs reviewed and stable  Post vital signs: Reviewed and stable  Last Vitals:  Vitals Value Taken Time  BP 129/72 12/23/23 09:27  Temp 35.8 0927  Pulse 77 12/23/23 09:30  Resp 17 12/23/23 09:30  SpO2 96 % 12/23/23 09:30  Vitals shown include unfiled device data.  Last Pain:  Vitals:   12/23/23 0746  TempSrc: Temporal  PainSc: 0-No pain         Complications: No notable events documented.

## 2023-12-23 NOTE — Interval H&P Note (Signed)
 History and Physical Interval Note:  12/23/2023 8:37 AM  Carl Coffey  has presented today for surgery, with the diagnosis of Bladder Tumor.  The various methods of treatment have been discussed with the patient and family. After consideration of risks, benefits and other options for treatment, the patient has consented to  Procedure(s): CYSTOSCOPY, WITH BIOPSY (N/A) INSTILLATION, BLADDER (N/A) as a surgical intervention.  The patient's history has been reviewed, patient examined, no change in status, stable for surgery.  I have reviewed the patient's chart and labs.  Questions were answered to the patient's satisfaction.    CV: RRR Lungs: Clear   Jamiria Langill C Hilding Quintanar

## 2023-12-24 ENCOUNTER — Encounter: Payer: Self-pay | Admitting: Urology

## 2023-12-24 LAB — SURGICAL PATHOLOGY

## 2023-12-29 DIAGNOSIS — M545 Low back pain, unspecified: Secondary | ICD-10-CM | POA: Diagnosis not present

## 2023-12-29 DIAGNOSIS — R3129 Other microscopic hematuria: Secondary | ICD-10-CM | POA: Diagnosis not present

## 2023-12-29 DIAGNOSIS — N390 Urinary tract infection, site not specified: Secondary | ICD-10-CM | POA: Diagnosis not present

## 2024-01-03 ENCOUNTER — Ambulatory Visit: Payer: Self-pay | Admitting: Urology

## 2024-01-05 ENCOUNTER — Ambulatory Visit: Attending: Cardiovascular Disease

## 2024-01-05 DIAGNOSIS — Z5181 Encounter for therapeutic drug level monitoring: Secondary | ICD-10-CM | POA: Diagnosis not present

## 2024-01-05 DIAGNOSIS — I482 Chronic atrial fibrillation, unspecified: Secondary | ICD-10-CM | POA: Diagnosis not present

## 2024-01-05 DIAGNOSIS — Z7901 Long term (current) use of anticoagulants: Secondary | ICD-10-CM

## 2024-01-05 LAB — POCT INR: INR: 2.1 (ref 2.0–3.0)

## 2024-01-05 NOTE — Patient Instructions (Signed)
 continue taking 1 tablet daily.  INR in 8 weeks. [phone]

## 2024-01-24 ENCOUNTER — Other Ambulatory Visit: Payer: Self-pay

## 2024-01-24 ENCOUNTER — Observation Stay
Admission: EM | Admit: 2024-01-24 | Discharge: 2024-01-26 | Disposition: A | Discharge status: Home / Self Care | Attending: Student | Admitting: Student

## 2024-01-24 DIAGNOSIS — K921 Melena: Secondary | ICD-10-CM

## 2024-01-24 DIAGNOSIS — E876 Hypokalemia: Secondary | ICD-10-CM | POA: Diagnosis not present

## 2024-01-24 DIAGNOSIS — Z7901 Long term (current) use of anticoagulants: Secondary | ICD-10-CM | POA: Insufficient documentation

## 2024-01-24 DIAGNOSIS — Z79899 Other long term (current) drug therapy: Secondary | ICD-10-CM | POA: Insufficient documentation

## 2024-01-24 DIAGNOSIS — Z951 Presence of aortocoronary bypass graft: Secondary | ICD-10-CM | POA: Insufficient documentation

## 2024-01-24 DIAGNOSIS — E785 Hyperlipidemia, unspecified: Secondary | ICD-10-CM | POA: Diagnosis not present

## 2024-01-24 DIAGNOSIS — E039 Hypothyroidism, unspecified: Secondary | ICD-10-CM | POA: Diagnosis not present

## 2024-01-24 DIAGNOSIS — I482 Chronic atrial fibrillation, unspecified: Secondary | ICD-10-CM | POA: Diagnosis not present

## 2024-01-24 DIAGNOSIS — K625 Hemorrhage of anus and rectum: Principal | ICD-10-CM | POA: Diagnosis present

## 2024-01-24 DIAGNOSIS — I10 Essential (primary) hypertension: Secondary | ICD-10-CM | POA: Diagnosis not present

## 2024-01-24 DIAGNOSIS — K922 Gastrointestinal hemorrhage, unspecified: Principal | ICD-10-CM | POA: Diagnosis present

## 2024-01-24 DIAGNOSIS — I2510 Atherosclerotic heart disease of native coronary artery without angina pectoris: Secondary | ICD-10-CM | POA: Diagnosis not present

## 2024-01-24 LAB — COMPREHENSIVE METABOLIC PANEL WITH GFR
ALT: 17 U/L (ref 0–44)
AST: 25 U/L (ref 15–41)
Albumin: 3.3 g/dL — ABNORMAL LOW (ref 3.5–5.0)
Alkaline Phosphatase: 31 U/L — ABNORMAL LOW (ref 38–126)
Anion gap: 11 (ref 5–15)
BUN: 34 mg/dL — ABNORMAL HIGH (ref 8–23)
CO2: 26 mmol/L (ref 22–32)
Calcium: 8.6 mg/dL — ABNORMAL LOW (ref 8.9–10.3)
Chloride: 104 mmol/L (ref 98–111)
Creatinine, Ser: 1.03 mg/dL (ref 0.61–1.24)
GFR, Estimated: >60 mL/min (ref >60)
Glucose, Bld: 100 mg/dL — ABNORMAL HIGH (ref 70–99)
Potassium: 3.8 mmol/L (ref 3.5–5.1)
Sodium: 141 mmol/L (ref 135–145)
Total Bilirubin: 2 mg/dL — ABNORMAL HIGH (ref 0.0–1.2)
Total Protein: 6.2 g/dL — ABNORMAL LOW (ref 6.5–8.1)

## 2024-01-24 LAB — PROTIME-INR
INR: 2.8 — ABNORMAL HIGH (ref 0.8–1.2)
Prothrombin Time: 30.9 s — ABNORMAL HIGH (ref 11.4–15.2)

## 2024-01-24 LAB — CBC
HCT: 39.7 % (ref 39.0–52.0)
Hemoglobin: 13.1 g/dL (ref 13.0–17.0)
MCH: 31.9 pg (ref 26.0–34.0)
MCHC: 33 g/dL (ref 30.0–36.0)
MCV: 96.6 fL (ref 80.0–100.0)
Platelets: 204 K/uL (ref 150–400)
RBC: 4.11 MIL/uL — ABNORMAL LOW (ref 4.22–5.81)
RDW: 14.6 % (ref 11.5–15.5)
WBC: 6.7 K/uL (ref 4.0–10.5)
nRBC: 0 % (ref 0.0–0.2)

## 2024-01-24 MED ORDER — ACETAMINOPHEN 650 MG RE SUPP: 650.0000 mg | Freq: Four times a day (QID) | RECTAL | Status: DC | PRN

## 2024-01-24 MED ORDER — ONDANSETRON HCL 4 MG PO TABS: 4.0000 mg | ORAL_TABLET | Freq: Four times a day (QID) | ORAL | Status: DC | PRN

## 2024-01-24 MED ORDER — ACETAMINOPHEN 325 MG PO TABS: 650.0000 mg | ORAL_TABLET | Freq: Four times a day (QID) | ORAL | Status: DC | PRN

## 2024-01-24 MED ORDER — PANTOPRAZOLE SODIUM 40 MG IV SOLR
40.0000 mg | Freq: Two times a day (BID) | INTRAVENOUS | Status: DC
  Administered 2024-01-24 – 2024-01-26 (×4): 40 mg via INTRAVENOUS
  Filled 2024-01-24 (×4): qty 10

## 2024-01-24 MED ORDER — SODIUM CHLORIDE 0.9 % IV SOLN: INTRAVENOUS | Status: AC

## 2024-01-24 MED ORDER — LEVOTHYROXINE SODIUM 50 MCG PO TABS
75.0000 ug | ORAL_TABLET | Freq: Every day | ORAL | Status: DC
  Administered 2024-01-25 – 2024-01-26 (×2): 75 ug via ORAL
  Filled 2024-01-24: qty 2

## 2024-01-24 MED ORDER — OXYCODONE HCL 5 MG PO TABS: 5.0000 mg | ORAL_TABLET | ORAL | Status: DC | PRN

## 2024-01-24 MED ORDER — ALBUTEROL SULFATE (2.5 MG/3ML) 0.083% IN NEBU: 3.0000 mL | INHALATION_SOLUTION | RESPIRATORY_TRACT | Status: DC | PRN

## 2024-01-24 MED ORDER — GABAPENTIN 100 MG PO CAPS
100.0000 mg | ORAL_CAPSULE | Freq: Every day | ORAL | Status: DC
  Administered 2024-01-24 – 2024-01-25 (×2): 100 mg via ORAL
  Filled 2024-01-24 (×2): qty 1

## 2024-01-24 MED ORDER — ONDANSETRON HCL 4 MG/2ML IJ SOLN
4.0000 mg | Freq: Four times a day (QID) | INTRAMUSCULAR | Status: DC | PRN
Start: 2024-01-24 — End: 2024-01-26

## 2024-01-24 MED ORDER — PANTOPRAZOLE SODIUM 40 MG IV SOLR
40.0000 mg | INTRAVENOUS | Status: AC
  Administered 2024-01-24 (×2): 40 mg via INTRAVENOUS
  Filled 2024-01-24 (×2): qty 10

## 2024-01-24 NOTE — ED Notes (Signed)
 2C called and informed Secretary pt is on the way up.

## 2024-01-24 NOTE — ED Notes (Signed)
 Pt assisted up to [facility]. Pt has another bright red loose stool. Pt back in bed and placed back on monitor.

## 2024-01-24 NOTE — ED Notes (Signed)
Pt given supper tray.

## 2024-01-24 NOTE — ED Notes (Signed)
 Pt ambulatory with steady gait to bathroom. Pt had large loose bloody stool bright red coloration.

## 2024-01-24 NOTE — ED Notes (Signed)
 Pt ambulated to bedside commode with cane. Pt had bowel movement with significant amount of blood noted. Floy, MD, made aware.

## 2024-01-24 NOTE — ED Notes (Signed)
 Pt changed into gown and assisted into bed. Pt placed in yellow fall socks. Pt given call bell. Pt placed on monitor.

## 2024-01-24 NOTE — Plan of Care (Signed)

## 2024-01-24 NOTE — ED Triage Notes (Signed)
 First Nurse Note: Patient to ED from [facility] for rectal bleeding x3 days. States diarrhea. On blood thinners. Denies abd pain. VS WNL

## 2024-01-24 NOTE — ED Notes (Signed)
 Pt up to [facility]

## 2024-01-24 NOTE — Consult Note (Signed)
 Rogelia Copping, MD [facility]  [address], [address] [place], [place] [address] Phone: [phone] Fax : [phone]  Consultation  Referring Provider:     Dr. Roann Primary Care Physician:  Lenon Layman ORN, MD Primary Gastroenterologist: Sampson         Reason for Consultation:     GI bleeding  Date of Admission:  01/24/2024 Date of Consultation:  01/24/2024         HPI:   Carl Coffey is a 89 y.o. male who reports that he had a history of this GI bleeding about 5 months ago and at that time a GI consult was not called and no further workup was done and the patient was discharged without any issue.  The patient's hemoglobin 5 months ago was 13.2 and today he comes in at 13.1.  The patient reports that he had about 8 bowel movements a day starting on Saturday that were black with fresh blood.  His bleeding had slowed down and he says he has only had 2 bowel movements today that had blood in it. The patient did not have any GI workup in the recent past.  He has been on Coumadin  and states that he believes that this is the reason he is having his issues. He denies any Advil Aleve Motrin BC's Goody powders or any other NSAIDs.  Past Medical History:  Diagnosis Date   Acute ST elevation myocardial infarction (STEMI) of anterolateral wall (HCC) 06/18/1994   a.) Tx'd with tPA --> LHC (+) for multi-vessel CAD --> CVTS; b.) s/p 3v CABG 06/25/1994   Anticoagulated on warfarin    Aortic atherosclerosis    Basal cell carcinoma 10/13/2017   right ant neck   Basal cell carcinoma 10/09/2020   right dorsum hand - EDC   Basal cell carcinoma 10/08/2021   Right mid dorsum nose. Nodular, ulcerated. EDC   Basal cell carcinoma 04/13/2022   Right Dorsum Nose, EDC   Bilateral inguinal hernia    Bladder tumor    BPH (benign prostatic hyperplasia)    Cardiomegaly    Chilaiditi's syndrome (HCC)    Cholelithiasis    Coronary artery disease    Dysplastic nevus 02/10/2016   right og midline  epigastric   GI bleeding 07/2023   History of 2019 novel coronavirus disease (COVID-19) 12/20/2022   History of bilateral cataract extraction    HLD (hyperlipidemia)    Hypertension    Hypothyroidism    Pancolonic diverticulosis    Peripheral edema    Permanent atrial fibrillation (HCC)    a.) CHA2DS2VASc = 4 (age x2, HTN, vascular disease) as of 12/20/2023; b.) rate/rhythm maintained intrinsically without pharmacological intervention; chronically anticoagulated with warfarin   Prediabetes    S/P CABG x 3 06/25/1994   a.) LIMA-LAD, SVG-OM1, SVG-PDA   Squamous cell carcinoma of skin 07/26/2007   right post earlobe (in situ)    Past Surgical History:  Procedure Laterality Date   BLADDER INSTILLATION N/A 12/23/2023   Procedure: INSTILLATION, BLADDER;  Surgeon: Twylla Glendia BROCKS, MD;  Location: [facility];  Service: Urology;  Laterality: N/A;   CATARACT EXTRACTION, BILATERAL     CORONARY ARTERY BYPASS GRAFT N/A 06/25/1994   Procedure: CORONARY ARTERY BYPASS GRAFT; Location: [facility]; Surgeon: Lamar Moats, MD   CYSTOSCOPY W/ RETROGRADES Bilateral 02/20/2019   Procedure: CYSTOSCOPY WITH RETROGRADE PYELOGRAM;  Surgeon: Penne Knee, MD;  Location: [facility];  Service: Urology;  Laterality: Bilateral;   CYSTOSCOPY WITH BIOPSY N/A 12/23/2023  Procedure: CYSTOSCOPY, WITH BIOPSY;  Surgeon: Twylla Glendia BROCKS, MD;  Location: [facility];  Service: Urology;  Laterality: N/A;   HERNIA REPAIR Bilateral    MEATOTOMY N/A 02/20/2019   Procedure: MEATOTOMY ADULT;  Surgeon: Penne Knee, MD;  Location: [facility];  Service: Urology;  Laterality: N/A;   TRANSURETHRAL RESECTION OF BLADDER TUMOR WITH MITOMYCIN -C N/A 02/20/2019   Procedure: TRANSURETHRAL RESECTION OF BLADDER TUMOR WITH Gemcitabine ;  Surgeon: Penne Knee, MD;  Location: [facility];  Service: Urology;  Laterality: N/A;    Prior to Admission medications   Medication Sig Start Date End Date Taking? Authorizing Provider  acetaminophen  (TYLENOL )  500 MG tablet Take 500-1,000 mg by mouth every 6 (six) hours as needed (pain. (lower back/lower leg pain)).   Yes [provider]  albuterol  (VENTOLIN  HFA) 108 (90 Base) MCG/ACT inhaler Inhale 2 puffs into the lungs every 4 (four) hours as needed. 12/20/22  Yes Jacolyn Pae, MD  cyanocobalamin  (VITAMIN B12) 1000 MCG tablet Take 1,000 mcg by mouth in the morning.   Yes [provider]  finasteride  (PROSCAR ) 5 MG tablet Take 5 mg by mouth every evening. 02/19/17  Yes [provider]  gabapentin  (NEURONTIN ) 100 MG capsule Take 100 mg by mouth at bedtime.   Yes [provider]  ketoconazole  (NIZORAL ) 2 % shampoo USE AS A SHAMPOO ON SCALP 3 TIMES WEEKLY. LET SIT FOR 5 MINUTES BEFORE RINSING. 12/22/23  Yes Hester Alm BROCKS, MD  levothyroxine  (SYNTHROID , LEVOTHROID) 75 MCG tablet Take 75 mcg by mouth daily before breakfast.  12/04/16  Yes [provider]  simvastatin  (ZOCOR ) 10 MG tablet Take 10 mg by mouth at bedtime. 11/26/21  Yes [provider]  warfarin (COUMADIN ) 2.5 MG tablet Take 1-2 tablets daily or as prescribed by coumadin  clinic. Patient taking differently: Take 2.5 mg by mouth daily at 4 PM. Take 1-2 tablets daily or as prescribed by coumadin  clinic. 09/10/23  Yes [name], Timothy J, MD  cefdinir (OMNICEF) 300 MG capsule Take 300 mg by mouth 2 (two) times daily. Patient not taking: Reported on 01/24/2024 12/29/23   [provider]  predniSONE  (DELTASONE ) 10 MG tablet Take 10 mg by mouth daily. Patient not taking: Reported on 01/24/2024 12/29/23   [provider]    History reviewed. No pertinent family history.   Social History   Tobacco Use   Smoking status: Never    Passive exposure: Never   Smokeless tobacco: Never  Vaping Use   Vaping status: Never Used  Substance Use Topics   Alcohol use: Never   Drug use: Never    Allergies as of 01/24/2024 - Review Complete 01/24/2024  Allergen Reaction Noted    Flexeril [cyclobenzaprine] Other (See Comments) 01/24/2019    Review of Systems:    All systems reviewed and negative except where noted in HPI.   Physical Exam:  Vital signs in last 24 hours: Temp:  [98 F (36.7 C)] 98 F (36.7 C) (11/10 1200) Pulse Rate:  [59-68] 61 (11/10 1345) Resp:  [16-18] 17 (11/10 1345) BP: (131-167)/(78-90) 160/78 (11/10 1300) SpO2:  [99 %-100 %] 100 % (11/10 1345)   General:   Pleasant, cooperative in NAD Head:  Normocephalic and atraumatic. Eyes:   No icterus.   Conjunctiva pink. PERRLA. Ears:  Normal auditory acuity. Neck:  Supple; no masses or thyroidomegaly Lungs: Respirations even and unlabored. Lungs clear to auscultation bilaterally.   No wheezes, crackles, or rhonchi.  Heart:  Regular rate and rhythm;  Without murmur, clicks, rubs  or gallops Abdomen:  Soft, nondistended, nontender. Normal bowel sounds. No appreciable masses or hepatomegaly.  No rebound or guarding.  Rectal:  Not performed. Msk:  Symmetrical without gross deformities.   Extremities:  Without edema, cyanosis or clubbing. Neurologic:  Alert and oriented x3;  grossly normal neurologically. Skin:  Intact without significant lesions or rashes. Cervical Nodes:  No significant cervical adenopathy. Psych:  Alert and cooperative. Normal affect.  LAB RESULTS: Recent Labs    01/24/24 0937  WBC 6.7  HGB 13.1  HCT 39.7  PLT 204   BMET Recent Labs    01/24/24 0937  NA 141  K 3.8  CL 104  CO2 26  GLUCOSE 100*  BUN 34*  CREATININE 1.03  CALCIUM 8.6*   LFT Recent Labs    01/24/24 0937  PROT 6.2*  ALBUMIN 3.3*  AST 25  ALT 17  ALKPHOS 31*  BILITOT 2.0*   PT/INR Recent Labs    01/24/24 1032  LABPROT 30.9*  INR 2.8*    STUDIES: No results found.    Impression / Plan:   Assessment: Principal Problem:   GI bleeding Active Problems:   Chronic atrial fibrillation (HCC)   Coronary atherosclerosis   Hyperlipidemia   Hypertension, benign   Hypothyroidism,  unspecified   Long term (current) use of anticoagulants   Dwain E Sondgeroth is a 89 y.o. y/o male with who comes in with rectal bleeding that appears to have slowed down significantly.  The patient has been told the risks and benefits of proceeding with an EGD and colonoscopy which would be the next step in somebody who has been having GI bleeding.  Plan:  The patient states that at his age she does not want to entertain an EGD and colonoscopy at this time but would like to talk about not restarting the Coumadin  because he thinks is responsible.  I have told him that he can discuss that with the hospitalist and his cardiologist if need be.  The patient states that if he continues to bleed he will reconsider but if he does we will need to hold the Coumadin  for 5 days prior to doing any endoscopic procedures.  The patient has been explained the plan and agrees with it.  Thank you for involving me in the care of this patient.      LOS: 0 days   Rogelia Copping, MD, MD. NOLIA 01/24/2024, 3:14 PM,  Pager [phone] 7am-5pm  Check AMION for 5pm -7am coverage and on weekends   Note: This dictation was prepared with Dragon dictation along with smaller phrase technology. Any transcriptional errors that result from this process are unintentional.

## 2024-01-24 NOTE — ED Notes (Signed)
 Admit MD at bedside

## 2024-01-24 NOTE — ED Provider Notes (Signed)
 [facility] Provider Note    Event Date/Time   First MD Initiated Contact with Patient 01/24/24 [phone]     (approximate)   History   Rectal Bleeding   HPI  Karmello E Garciagarcia is a 89 y.o. male  who presents to the emergency department today because of concern for rectal bleeding. For the past three days the patient has noticed black and red stool. Has had multiple episodes. Is on warfarin, denies any recent dose changes. Says he has had gi bleed once in the past about a year ago. Denies any associated abdominal pain. Per chart review was admitted to the hospital about 6 months ago for similar symptoms. Did not have EGD done at that time.      Physical Exam   Triage Vital Signs: ED Triage Vitals [01/24/24 0920]  Encounter Vitals Group     BP 131/88     Girls Systolic BP Percentile      Girls Diastolic BP Percentile      Boys Systolic BP Percentile      Boys Diastolic BP Percentile      Pulse Rate 68     Resp 18     Temp 98 F (36.7 C)     Temp Source Oral     SpO2 99 %     Weight      Height      Head Circumference      Peak Flow      Pain Score 0     Pain Loc      Pain Education      Exclude from Growth Chart     Most recent vital signs: Vitals:   01/24/24 0920  BP: 131/88  Pulse: 68  Resp: 18  Temp: 98 F (36.7 C)  SpO2: 99%   General: Awake, alert, oriented. CV:  Good peripheral perfusion. Regular rate and rhythm. Resp:  Normal effort. Lungs clear. Abd:  No distention. Non tender. Rectal:  Melanotic stool on glove. GUIAC positive   ED Results / Procedures / Treatments   Labs (all labs ordered are listed, but only abnormal results are displayed) Labs Reviewed  COMPREHENSIVE METABOLIC PANEL WITH GFR - Abnormal; Notable for the following components:      Result Value   Glucose, Bld 100 (*)    BUN 34 (*)    Calcium 8.6 (*)    Total Protein 6.2 (*)    Albumin 3.3 (*)    Alkaline Phosphatase 31 (*)    Total Bilirubin 2.0 (*)     All other components within normal limits  CBC - Abnormal; Notable for the following components:   RBC 4.11 (*)    All other components within normal limits  PROTIME-INR - Abnormal; Notable for the following components:   Prothrombin Time 30.9 (*)    INR 2.8 (*)    All other components within normal limits  POC OCCULT BLOOD, ED  TYPE AND SCREEN     EKG  None   RADIOLOGY None   PROCEDURES:  Critical Care performed: No    MEDICATIONS ORDERED IN ED: Medications - No data to display   IMPRESSION / MDM / ASSESSMENT AND PLAN / ED COURSE  I reviewed the triage vital signs and the nursing notes.                              Differential diagnosis includes, but is not limited to,  AVM, neoplasm, gastritis, duodenitis, diverticular disease  Patient's presentation is most consistent with acute presentation with potential threat to life or bodily function.   Patient presented to the emergency department today because of concerns for GI bleed.  On exam patient does have melanotic stool on glove.  Grossly guaiac positive.  Initial blood work without concerning anemia.  Patient's vital signs without concerning hypotension or tachycardia.  However while here in the emergency department patient had another large bloody bowel movement.  Is on warfarin for A-fib and INR is elevated at 2.8.  Given concerns for continued bleed and being on blood thinners I do think patient would benefit for further workup and management.  Will start Protonix  here in the emergency department.      FINAL CLINICAL IMPRESSION(S) / ED DIAGNOSES   Final diagnoses:  Acute GI bleeding      Note:  This document was prepared using Dragon voice recognition software and may include unintentional dictation errors.    Floy Roberts, MD 01/24/24 [phone]

## 2024-01-24 NOTE — ED Triage Notes (Signed)
 Pt to ED via POV from [facility]. Pt reports dark tarry stools x3 days. Pt is on blood thinners. Denies pain.

## 2024-01-24 NOTE — H&P (Signed)
 History and Physical    Carl Coffey FMW:[id number] DOB: 1921/08/12 DOA: 01/24/2024  DOS: the patient was seen and examined on 01/24/2024  PCP: Lenon Layman ORN, MD   Patient coming from: Home  I have personally briefly reviewed patient's old medical records in [facility]  Chief Complaint: Rectal bleeding x 3 days  HPI: Carl Coffey is a pleasant 89 y.o. male with medical history significant for atrial fibrillation on chronic warfarin, CAD s/p CABG in 1990s, HTN, hypothyroidism, bladder cancer presented to ED complaining of blood per rectum mixed with fresh blood.  Patient had similar symptoms in May of this year and patient's family chose for conservative management.  Aspirin  and Coumadin  was held at that time and aspirin  was held permanently per note.  Patient was restarted on Coumadin .  Patient denied any other symptoms including abdominal pain, nausea, vomiting, fever, chills, chest pain, shortness of breath, palpitations.  ED Course: Upon arrival to the ED, patient is found to be reassuring vital signs, normal hemoglobin hematocrit.  Hospitalist service was consulted for evaluation for admission.  Review of Systems:  ROS  All other systems negative except as noted in the HPI.  Past Medical History:  Diagnosis Date   Acute ST elevation myocardial infarction (STEMI) of anterolateral wall (HCC) 06/18/1994   a.) Tx'd with tPA --> LHC (+) for multi-vessel CAD --> CVTS; b.) s/p 3v CABG 06/25/1994   Anticoagulated on warfarin    Aortic atherosclerosis    Basal cell carcinoma 10/13/2017   right ant neck   Basal cell carcinoma 10/09/2020   right dorsum hand - [facility]   Basal cell carcinoma 10/08/2021   Right mid dorsum nose. Nodular, ulcerated. [facility]   Basal cell carcinoma 04/13/2022   Right Dorsum Nose, [facility]   Bilateral inguinal hernia    Bladder tumor    BPH (benign prostatic hyperplasia)    Cardiomegaly    Chilaiditi's syndrome (HCC)    Cholelithiasis    Coronary artery  disease    Dysplastic nevus 02/10/2016   right og midline epigastric   GI bleeding 07/2023   History of 2019 novel coronavirus disease (COVID-19) 12/20/2022   History of bilateral cataract extraction    HLD (hyperlipidemia)    Hypertension    Hypothyroidism    Pancolonic diverticulosis    Peripheral edema    Permanent atrial fibrillation (HCC)    a.) CHA2DS2VASc = 4 (age x2, HTN, vascular disease) as of 12/20/2023; b.) rate/rhythm maintained intrinsically without pharmacological intervention; chronically anticoagulated with warfarin   Prediabetes    S/P CABG x 3 06/25/1994   a.) LIMA-LAD, SVG-OM1, SVG-PDA   Squamous cell carcinoma of skin 07/26/2007   right post earlobe (in situ)    Past Surgical History:  Procedure Laterality Date   BLADDER INSTILLATION N/A 12/23/2023   Procedure: INSTILLATION, BLADDER;  Surgeon: Twylla Glendia BROCKS, MD;  Location: [facility];  Service: Urology;  Laterality: N/A;   CATARACT EXTRACTION, BILATERAL     CORONARY ARTERY BYPASS GRAFT N/A 06/25/1994   Procedure: CORONARY ARTERY BYPASS GRAFT; Location: [facility]; Surgeon: Lamar Moats, MD   CYSTOSCOPY W/ RETROGRADES Bilateral 02/20/2019   Procedure: CYSTOSCOPY WITH RETROGRADE PYELOGRAM;  Surgeon: Penne Knee, MD;  Location: [facility];  Service: Urology;  Laterality: Bilateral;   CYSTOSCOPY WITH BIOPSY N/A 12/23/2023   Procedure: CYSTOSCOPY, WITH BIOPSY;  Surgeon: Twylla Glendia BROCKS, MD;  Location: [facility];  Service: Urology;  Laterality: N/A;   HERNIA REPAIR Bilateral    MEATOTOMY N/A 02/20/2019  Procedure: MEATOTOMY ADULT;  Surgeon: Penne Knee, MD;  Location: [facility];  Service: Urology;  Laterality: N/A;   TRANSURETHRAL RESECTION OF BLADDER TUMOR WITH MITOMYCIN -C N/A 02/20/2019   Procedure: TRANSURETHRAL RESECTION OF BLADDER TUMOR WITH Gemcitabine ;  Surgeon: Penne Knee, MD;  Location: [facility];  Service: Urology;  Laterality: N/A;     reports that he has never smoked. He has never been exposed to  tobacco smoke. He has never used smokeless tobacco. He reports that he does not drink alcohol and does not use drugs.  Allergies  Allergen Reactions   Flexeril [Cyclobenzaprine] Other (See Comments)    Blacks out    History reviewed. No pertinent family history.  Prior to Admission medications   Medication Sig Start Date End Date Taking? Authorizing Provider  acetaminophen  (TYLENOL ) 500 MG tablet Take 500-1,000 mg by mouth every 6 (six) hours as needed (pain. (lower back/lower leg pain)).   Yes [provider]  albuterol  (VENTOLIN  HFA) 108 (90 Base) MCG/ACT inhaler Inhale 2 puffs into the lungs every 4 (four) hours as needed. 12/20/22  Yes Jacolyn Pae, MD  cyanocobalamin  (VITAMIN B12) 1000 MCG tablet Take 1,000 mcg by mouth in the morning.   Yes [provider]  finasteride  (PROSCAR ) 5 MG tablet Take 5 mg by mouth every evening. 02/19/17  Yes [provider]  gabapentin  (NEURONTIN ) 100 MG capsule Take 100 mg by mouth at bedtime.   Yes [provider]  ketoconazole  (NIZORAL ) 2 % shampoo USE AS A SHAMPOO ON SCALP 3 TIMES WEEKLY. LET SIT FOR 5 MINUTES BEFORE RINSING. 12/22/23  Yes Hester Alm BROCKS, MD  levothyroxine  (SYNTHROID , LEVOTHROID) 75 MCG tablet Take 75 mcg by mouth daily before breakfast.  12/04/16  Yes [provider]  simvastatin  (ZOCOR ) 10 MG tablet Take 10 mg by mouth at bedtime. 11/26/21  Yes [provider]  warfarin (COUMADIN ) 2.5 MG tablet Take 1-2 tablets daily or as prescribed by coumadin  clinic. Patient taking differently: Take 2.5 mg by mouth daily at 4 PM. Take 1-2 tablets daily or as prescribed by coumadin  clinic. 09/10/23  Yes [name], Timothy J, MD  cefdinir (OMNICEF) 300 MG capsule Take 300 mg by mouth 2 (two) times daily. Patient not taking: Reported on 01/24/2024 12/29/23   [provider]  predniSONE  (DELTASONE ) 10 MG tablet Take 10 mg by mouth daily. Patient not taking: Reported on 01/24/2024 12/29/23    [provider]    Physical Exam: Vitals:   01/24/24 1230 01/24/24 1245 01/24/24 1300 01/24/24 1345  BP: (!) 152/87  (!) 160/78   Pulse: 61 63 (!) 59 61  Resp:    17  Temp:      TempSrc:      SpO2: 100% 100% 100% 100%    Physical Exam   Constitutional: Alert, awake, calm, comfortable HEENT: Neck supple Respiratory: Clear to auscultation B/L, no wheezing, no rales.  Cardiovascular: Regular rate and rhythm, no murmurs / rubs / gallops. No extremity edema. 2+ pedal pulses. No carotid bruits.  Abdomen: Soft, no tenderness, Bowel sounds positive.  Musculoskeletal: no clubbing / cyanosis. Good ROM, no contractures. Normal muscle tone.  Skin: no rashes, lesions, ulcers. Neurologic: CN 2-12 grossly intact. Sensation intact, No focal deficit identified Psychiatric: Alert and oriented x 3. Normal mood.    Labs on Admission: I have personally reviewed following labs and imaging studies  CBC: Recent Labs  Lab 01/24/24 0937  WBC 6.7  HGB 13.1  HCT 39.7  MCV 96.6  PLT 204  Basic Metabolic Panel: Recent Labs  Lab 01/24/24 0937  NA 141  K 3.8  CL 104  CO2 26  GLUCOSE 100*  BUN 34*  CREATININE 1.03  CALCIUM 8.6*   GFR: CrCl cannot be calculated (Unknown ideal weight.). Liver Function Tests: Recent Labs  Lab 01/24/24 0937  AST 25  ALT 17  ALKPHOS 31*  BILITOT 2.0*  PROT 6.2*  ALBUMIN 3.3*   No results for input(s): LIPASE, AMYLASE in the last 168 hours. No results for input(s): AMMONIA in the last 168 hours. Coagulation Profile: Recent Labs  Lab 01/24/24 1032  INR 2.8*   Cardiac Enzymes: No results for input(s): CKTOTAL, CKMB, CKMBINDEX, TROPONINI, TROPONINIHS in the last 168 hours. BNP (last 3 results) No results for input(s): BNP in the last 8760 hours. HbA1C: No results for input(s): HGBA1C in the last 72 hours. CBG: No results for input(s): GLUCAP in the last 168 hours. Lipid Profile: No results for input(s): CHOL,  HDL, LDLCALC, TRIG, CHOLHDL, LDLDIRECT in the last 72 hours. Thyroid  Function Tests: No results for input(s): TSH, T4TOTAL, FREET4, T3FREE, THYROIDAB in the last 72 hours. Anemia Panel: No results for input(s): VITAMINB12, FOLATE, FERRITIN, TIBC, IRON, RETICCTPCT in the last 72 hours. Urine analysis:    Component Value Date/Time   COLORURINE STRAW (A) 02/26/2019 2100   APPEARANCEUR Clear 12/13/2023 0949   LABSPEC 1.005 02/26/2019 2100   PHURINE 7.0 02/26/2019 2100   GLUCOSEU Negative 12/13/2023 0949   HGBUR LARGE (A) 02/26/2019 2100   BILIRUBINUR Negative 12/13/2023 0949   KETONESUR NEGATIVE 02/26/2019 2100   PROTEINUR Trace 12/13/2023 0949   PROTEINUR NEGATIVE 02/26/2019 2100   NITRITE Negative 12/13/2023 0949   NITRITE NEGATIVE 02/26/2019 2100   LEUKOCYTESUR Negative 12/13/2023 0949   LEUKOCYTESUR NEGATIVE 02/26/2019 2100    Radiological Exams on Admission: I have personally reviewed images No results found.  EKG: N/A    Assessment/Plan Principal Problem:   GI bleeding Active Problems:   Chronic atrial fibrillation (HCC)   Coronary atherosclerosis   Hyperlipidemia   Hypertension, benign   Hypothyroidism, unspecified   Long term (current) use of anticoagulants    Assessment and Plan: 89 year old male with history of atrial fibrillation on warfarin, HTN, HLD, CAD s/p CABG, bladder cancer who was brought in for GI bleeding for the last 3 days.  1.  GI bleeding in the setting of Coumadin  use - Hemoglobin hematocrit has been stable - Will place him in observation - Will hold Coumadin  - Will start him on Protonix  - Discussed with GI who advised that patient and family not interested for aggressive management. - Discussed with cardiology regarding continuation of Coumadin , Dr. Darron will review with the patient. - Due to stable hemodynamically and hemoglobin heart rate is stable, will continue to monitor with hemoglobin hematocrit check  and patient's vital signs.  2.  CAD s/p CABG/A-fib on Coumadin  - Hold Coumadin  at this point - Resume other home medications  3.  HTN/HLD - Resume home medications  4.  Hypothyroidism - Continue levothyroxine     DVT prophylaxis: SCDs Code Status: DNI but wants CPR Family Communication: Patient and son at bedside Disposition Plan: Home Consults called: GI, cardiology Admission status: Observation, Telemetry bed   Nena Rebel, MD [facility] 01/24/2024, 3:07 PM

## 2024-01-24 NOTE — ED Notes (Signed)
 Pt had another loose bloody stool. Pt cleaned up and got back in bed. BSC cleaned and chux placed under BSC. Pt placed back on monitors. Call bell within reach. Family at bedside

## 2024-01-24 NOTE — Progress Notes (Signed)
 Order received from Dr Paudel for a regular diet

## 2024-01-25 ENCOUNTER — Telehealth (HOSPITAL_COMMUNITY): Payer: Self-pay | Admitting: Pharmacy Technician

## 2024-01-25 ENCOUNTER — Other Ambulatory Visit (HOSPITAL_COMMUNITY): Payer: Self-pay

## 2024-01-25 DIAGNOSIS — K922 Gastrointestinal hemorrhage, unspecified: Secondary | ICD-10-CM | POA: Diagnosis not present

## 2024-01-25 DIAGNOSIS — I2510 Atherosclerotic heart disease of native coronary artery without angina pectoris: Secondary | ICD-10-CM | POA: Diagnosis not present

## 2024-01-25 DIAGNOSIS — I4821 Permanent atrial fibrillation: Secondary | ICD-10-CM | POA: Diagnosis not present

## 2024-01-25 LAB — COMPREHENSIVE METABOLIC PANEL WITH GFR
ALT: 15 U/L (ref 0–44)
AST: 23 U/L (ref 15–41)
Albumin: 2.9 g/dL — ABNORMAL LOW (ref 3.5–5.0)
Alkaline Phosphatase: 25 U/L — ABNORMAL LOW (ref 38–126)
Anion gap: 8 (ref 5–15)
BUN: 30 mg/dL — ABNORMAL HIGH (ref 8–23)
CO2: 26 mmol/L (ref 22–32)
Calcium: 8.2 mg/dL — ABNORMAL LOW (ref 8.9–10.3)
Chloride: 108 mmol/L (ref 98–111)
Creatinine, Ser: 0.85 mg/dL (ref 0.61–1.24)
GFR, Estimated: >60 mL/min (ref >60)
Glucose, Bld: 77 mg/dL (ref 70–99)
Potassium: 3.4 mmol/L — ABNORMAL LOW (ref 3.5–5.1)
Sodium: 142 mmol/L (ref 135–145)
Total Bilirubin: 1.6 mg/dL — ABNORMAL HIGH (ref 0.0–1.2)
Total Protein: 5.1 g/dL — ABNORMAL LOW (ref 6.5–8.1)

## 2024-01-25 LAB — PROTIME-INR
INR: 3.4 — ABNORMAL HIGH (ref 0.8–1.2)
Prothrombin Time: 35.5 s — ABNORMAL HIGH (ref 11.4–15.2)

## 2024-01-25 LAB — MAGNESIUM: Magnesium: 2 mg/dL (ref 1.7–2.4)

## 2024-01-25 LAB — CBC
HCT: 35.4 % — ABNORMAL LOW (ref 39.0–52.0)
Hemoglobin: 11.6 g/dL — ABNORMAL LOW (ref 13.0–17.0)
MCH: 31.6 pg (ref 26.0–34.0)
MCHC: 32.8 g/dL (ref 30.0–36.0)
MCV: 96.5 fL (ref 80.0–100.0)
Platelets: 189 K/uL (ref 150–400)
RBC: 3.67 MIL/uL — ABNORMAL LOW (ref 4.22–5.81)
RDW: 14.5 % (ref 11.5–15.5)
WBC: 7 K/uL (ref 4.0–10.5)
nRBC: 0 % (ref 0.0–0.2)

## 2024-01-25 LAB — VITAMIN D 25 HYDROXY (VIT D DEFICIENCY, FRACTURES): Vit D, 25-Hydroxy: 14.76 ng/mL — ABNORMAL LOW (ref 30–100)

## 2024-01-25 LAB — HEMOGLOBIN AND HEMATOCRIT, BLOOD
HCT: 35.9 % — ABNORMAL LOW (ref 39.0–52.0)
HCT: 34.3 % — ABNORMAL LOW (ref 39.0–52.0)
Hemoglobin: 11.7 g/dL — ABNORMAL LOW (ref 13.0–17.0)
Hemoglobin: 11.5 g/dL — ABNORMAL LOW (ref 13.0–17.0)

## 2024-01-25 LAB — PHOSPHORUS: Phosphorus: 2.2 mg/dL — ABNORMAL LOW (ref 2.5–4.6)

## 2024-01-25 LAB — FOLATE: Folate: 13.5 ng/mL (ref >5.9)

## 2024-01-25 MED ORDER — POTASSIUM CHLORIDE 20 MEQ PO PACK
40.0000 meq | PACK | Freq: Once | ORAL | Status: AC
  Administered 2024-01-25: 40 meq via ORAL
  Filled 2024-01-25: qty 2

## 2024-01-25 MED ORDER — HYDRALAZINE HCL 50 MG PO TABS: 50.0000 mg | ORAL_TABLET | Freq: Four times a day (QID) | ORAL | Status: DC | PRN

## 2024-01-25 MED ORDER — PHYTONADIONE 5 MG PO TABS
2.5000 mg | ORAL_TABLET | Freq: Once | ORAL | Status: AC
  Administered 2024-01-25: 2.5 mg via ORAL
  Filled 2024-01-25: qty 1

## 2024-01-25 MED ORDER — VITAMIN D (ERGOCALCIFEROL) 1.25 MG (50000 UNIT) PO CAPS
50000.0000 [IU] | ORAL_CAPSULE | ORAL | Status: DC
Start: 2024-01-25 — End: 2024-03-21
  Filled 2024-01-25: qty 1

## 2024-01-25 MED ORDER — SIMVASTATIN 20 MG PO TABS
10.0000 mg | ORAL_TABLET | Freq: Every day | ORAL | Status: DC
  Administered 2024-01-25: 10 mg via ORAL
  Filled 2024-01-25: qty 1

## 2024-01-25 MED ORDER — FINASTERIDE 5 MG PO TABS
5.0000 mg | ORAL_TABLET | Freq: Every evening | ORAL | Status: DC
  Administered 2024-01-25: 5 mg via ORAL
  Filled 2024-01-25: qty 1

## 2024-01-25 MED ORDER — HYDRALAZINE HCL 20 MG/ML IJ SOLN: 10.0000 mg | Freq: Four times a day (QID) | INTRAMUSCULAR | Status: DC | PRN

## 2024-01-25 MED ORDER — K PHOS MONO-SOD PHOS DI & MONO 155-852-130 MG PO TABS
500.0000 mg | ORAL_TABLET | Freq: Four times a day (QID) | ORAL | Status: AC
  Filled 2024-01-25 (×2): qty 2

## 2024-01-25 NOTE — Care Management Obs Status (Signed)
 MEDICARE OBSERVATION STATUS NOTIFICATION   Patient Details  Name: Carl Coffey MRN: [id number] Date of Birth: [birth year]   Medicare Observation Status Notification Given:  Yes    Carl Coffey 01/25/2024, 11:33 AM

## 2024-01-25 NOTE — Telephone Encounter (Signed)
 Patient Product/process development scientist completed.    The patient is insured through [employer]/ [employer]. Patient has Medicare and is not eligible for a copay card, but may be able to apply for patient assistance or Medicare RX Payment Plan (Patient Must reach out to their plan, if eligible for payment plan), if available.    Ran test claim for Eliquis  5 mg and the current 30 day co-pay is $47.00.   This test claim was processed through [facility]- copay amounts may vary at other pharmacies due to pharmacy/plan contracts, or as the patient moves through the different stages of their insurance plan.     Reyes Sharps, CPHT Pharmacy Technician Patient Advocate Specialist Lead [facility] Patient Advocate Team Direct Number: [phone]  Fax: [phone]

## 2024-01-25 NOTE — Progress Notes (Signed)
 [facility] Progress Note  Patient: Carl Coffey    FMW:[id number]  DOA: 01/24/2024     Date of Service: the patient was seen and examined on 01/25/2024  Chief Complaint  Patient presents with   Rectal Bleeding   Brief hospital course:  Carl Coffey is a pleasant 89 y.o. male with medical history significant for atrial fibrillation on chronic warfarin, CAD s/p CABG in 1990s, HTN, hypothyroidism, bladder cancer presented to ED complaining of blood per rectum mixed with fresh blood.  Patient had similar symptoms in May of this year and patient's family chose for conservative management.  Aspirin  and Coumadin  was held at that time and aspirin  was held permanently per note.  Patient was restarted on Coumadin .  Patient denied any other symptoms including abdominal pain, nausea, vomiting, fever, chills, chest pain, shortness of breath, palpitations.   ED Course: Upon arrival to the ED, patient is found to be reassuring vital signs, normal hemoglobin hematocrit.  Hospitalist service was consulted for evaluation for admission.   Assessment and Plan:  # Lower GI bleeding in the setting of Coumadin  use -Continue to hold Coumadin  - Continue Protonix  - GI consulted, recommended colonoscopy after 5 days of holding Coumadin  but patient does not want any intervention. -Cardiology consulted, as per patient goal is recommended to discontinue Coumadin  going forward and may need baby aspirin  once bleeding resolves.  Down the road patient may need low-dose Eliquis if no bleeding in the future, it would be decided in the future. Vital signs stable Hb 13.1>11.6>11.7 INR 2.8>>3.4 Monitor H&H and transfuse if hemoglobin drops below 7  # Hypokalemia, potassium repleted. # Hypophosphatemia, Phos repleted. Monitor electrolytes and replete as needed.     # CAD s/p CABG/A-fib on Coumadin  - Hold Coumadin  at this point - Resume other home medications   # HTN/HLD - Resume home medications   #   Hypothyroidism - Continue levothyroxine   # Vitamin D deficiency: started vitamin D 50,000 units p.o. weekly, follow with PCP to repeat vitamin D level after 3 to 6 months.   There is no height or weight on file to calculate BMI.  Interventions:  Diet: Regular diet DVT Prophylaxis: SCD, pharmacological prophylaxis contraindicated due to acute lower GI bleeding   Advance goals of care discussion: Full code  Family Communication: family was present at bedside, at the time of interview.  The pt provided permission to discuss medical plan with the family. Opportunity was given to ask question and all questions were answered satisfactorily.   Disposition:  Pt is from Home, admitted with lower GI bleeding, still has risk of bleeding, need to be monitored, which precludes a safe discharge. Discharge to home, when stable, most likely tomorrow a.m.  Subjective: No significant events overnight.  Patient had 2 episodes of GI bleeding, around 5 AM and another one around 10 AM, blood clots noted.  Denied any abdominal pain, no chest pain or palpitation, no shortness of breath.  Physical Exam: General: NAD, lying comfortably Appear in no distress, affect appropriate Eyes: PERRLA ENT: Oral Mucosa Clear, moist  Neck: no JVD,  Cardiovascular: S1 and S2 Present, no Murmur,  Respiratory: good respiratory effort, Bilateral Air entry equal and Decreased, no Crackles, no wheezes Abdomen: Bowel Sound present, Soft and no tenderness,  Skin: no rashes Extremities: no Pedal edema, no calf tenderness Neurologic: without any new focal findings Gait not checked due to patient safety concerns  Vitals:   01/24/24 1625 01/24/24 1850 01/25/24 0408 01/25/24 0738  BP: ROLLEN)  160/78 (!) 191/86 (!) 170/76 (!) 158/87  Pulse: 63 66 60 63  Resp: 18 16 16 18   Temp: 98 F (36.7 C) 97.8 F (36.6 C) 97.9 F (36.6 C) 97.7 F (36.5 C)  TempSrc:  Oral Oral Oral  SpO2: 98% 99% 98% 99%    Intake/Output Summary (Last  24 hours) at 01/25/2024 1442 Last data filed at 01/25/2024 1322 Gross per 24 hour  Intake 240 ml  Output 400 ml  Net -160 ml   There were no vitals filed for this visit.  Data Reviewed: I have personally reviewed and interpreted daily labs, tele strips, imagings as discussed above. I reviewed all nursing notes, pharmacy notes, vitals, pertinent old records I have discussed plan of care as described above with RN and patient/family.  CBC: Recent Labs  Lab 01/24/24 0937 01/25/24 0436 01/25/24 1353  WBC 6.7 7.0  --   HGB 13.1 11.6* 11.7*  HCT 39.7 35.4* 35.9*  MCV 96.6 96.5  --   PLT 204 189  --    Basic Metabolic Panel: Recent Labs  Lab 01/24/24 0937 01/25/24 0436 01/25/24 0842  NA 141 142  --   K 3.8 3.4*  --   CL 104 108  --   CO2 26 26  --   GLUCOSE 100* 77  --   BUN 34* 30*  --   CREATININE 1.03 0.85  --   CALCIUM 8.6* 8.2*  --   MG  --   --  2.0  PHOS  --   --  2.2*    Studies: No results found.  Scheduled Meds:  finasteride   5 mg Oral QPM   gabapentin   100 mg Oral QHS   levothyroxine   75 mcg Oral QAC breakfast   pantoprazole  (PROTONIX ) IV  40 mg Intravenous Q12H   simvastatin   10 mg Oral QHS   Continuous Infusions: PRN Meds: acetaminophen  **OR** acetaminophen , albuterol , hydrALAZINE  **OR** hydrALAZINE , ondansetron  **OR** ondansetron  (ZOFRAN ) IV, oxyCODONE  Time spent: 55 minutes  Author: ELVAN SOR. MD [facility] 01/25/2024 2:42 PM  To reach On-call, see care teams to locate the attending and reach out to them via [email or web address]. If 7PM-7AM, please contact night-coverage If you still have difficulty reaching the attending provider, please page the [facility] (Director on Call) for [facility] on amion for assistance.

## 2024-01-25 NOTE — Consult Note (Signed)
 Cardiology Consultation   Patient ID: Carl Coffey MRN: [id number]; DOB: 05/08/1921  Admit date: 01/24/2024 Date of Consult: 01/25/2024  PCP:  Lenon Layman ORN, MD   [facility] Providers Cardiologist:  Evalene Lunger, MD        Patient Profile: Carl Coffey is a 89 y.o. male with a hx of permanent atrial fibrillation on warfarin complicated by GI bleeding, coronary artery disease status post CABG (1996), hypertension, hyperlipidemia, bladder cancer, and hypothyroidism, who is being seen 01/25/2024 for the evaluation of rectal bleeding in the setting of chronic anticoagulation at the request of Dr. Clinton.  History of Present Illness: Carl Coffey reports developing bright red and some black stools 3 days ago.  It was reminiscent of what he experienced in May when he was also admitted for a GI bleed.  At that time, he was on warfarin and aspirin ; the decision was made to discontinue aspirin  at that time and continue warfarin therapy.  He had been doing well without further GI bleeding until 3 days ago.  There has been no recent medication changes.  He reports being compliant with his warfarin and INR follow-ups.  He denies having any abdominal pain as well as chest pain, shortness of breath, and palpitations.  He has continued to have intermittent rectal bleeding, most recently this morning.  He was seen by GI yesterday and declined to undergo upper and lower endoscopy at this time (he was told that he would need to allow 5 days for warfarin to washout before endoscopies could be pursued).  His most recent outpatient INR was therapeutic at 2.1.  It was 2.8 yesterday on presentation at 3.4 this morning.   Past Medical History:  Diagnosis Date   Acute ST elevation myocardial infarction (STEMI) of anterolateral wall (HCC) 06/18/1994   a.) Tx'd with tPA --> LHC (+) for multi-vessel CAD --> CVTS; b.) s/p 3v CABG 06/25/1994   Anticoagulated on warfarin    Aortic atherosclerosis     Basal cell carcinoma 10/13/2017   right ant neck   Basal cell carcinoma 10/09/2020   right dorsum hand - [facility]   Basal cell carcinoma 10/08/2021   Right mid dorsum nose. Nodular, ulcerated. [facility]   Basal cell carcinoma 04/13/2022   Right Dorsum Nose, [facility]   Bilateral inguinal hernia    Bladder tumor    BPH (benign prostatic hyperplasia)    Cardiomegaly    Chilaiditi's syndrome (HCC)    Cholelithiasis    Coronary artery disease    Dysplastic nevus 02/10/2016   right og midline epigastric   GI bleeding 07/2023   History of 2019 novel coronavirus disease (COVID-19) 12/20/2022   History of bilateral cataract extraction    HLD (hyperlipidemia)    Hypertension    Hypothyroidism    Pancolonic diverticulosis    Peripheral edema    Permanent atrial fibrillation (HCC)    a.) CHA2DS2VASc = 4 (age x2, HTN, vascular disease) as of 12/20/2023; b.) rate/rhythm maintained intrinsically without pharmacological intervention; chronically anticoagulated with warfarin   Prediabetes    S/P CABG x 3 06/25/1994   a.) LIMA-LAD, SVG-OM1, SVG-PDA   Squamous cell carcinoma of skin 07/26/2007   right post earlobe (in situ)    Past Surgical History:  Procedure Laterality Date   BLADDER INSTILLATION N/A 12/23/2023   Procedure: INSTILLATION, BLADDER;  Surgeon: Twylla Glendia BROCKS, MD;  Location: [facility];  Service: Urology;  Laterality: N/A;   CATARACT EXTRACTION, BILATERAL     CORONARY ARTERY BYPASS GRAFT  N/A 06/25/1994   Procedure: CORONARY ARTERY BYPASS GRAFT; Location: [facility]; Surgeon: Lamar Moats, MD   CYSTOSCOPY W/ RETROGRADES Bilateral 02/20/2019   Procedure: CYSTOSCOPY WITH RETROGRADE PYELOGRAM;  Surgeon: Penne Knee, MD;  Location: [facility];  Service: Urology;  Laterality: Bilateral;   CYSTOSCOPY WITH BIOPSY N/A 12/23/2023   Procedure: CYSTOSCOPY, WITH BIOPSY;  Surgeon: Twylla Glendia BROCKS, MD;  Location: [facility];  Service: Urology;  Laterality: N/A;   HERNIA REPAIR Bilateral    MEATOTOMY N/A  02/20/2019   Procedure: MEATOTOMY ADULT;  Surgeon: Penne Knee, MD;  Location: [facility];  Service: Urology;  Laterality: N/A;   TRANSURETHRAL RESECTION OF BLADDER TUMOR WITH MITOMYCIN -C N/A 02/20/2019   Procedure: TRANSURETHRAL RESECTION OF BLADDER TUMOR WITH Gemcitabine ;  Surgeon: Penne Knee, MD;  Location: [facility];  Service: Urology;  Laterality: N/A;     Scheduled Meds:  finasteride   5 mg Oral QPM   gabapentin   100 mg Oral QHS   levothyroxine   75 mcg Oral QAC breakfast   pantoprazole  (PROTONIX ) IV  40 mg Intravenous Q12H   phosphorus  500 mg Oral QID   simvastatin   10 mg Oral QHS   Vitamin D (Ergocalciferol)  50,000 Units Oral Q7 days   Continuous Infusions:   PRN Meds: acetaminophen  **OR** acetaminophen , albuterol , hydrALAZINE  **OR** hydrALAZINE , ondansetron  **OR** ondansetron  (ZOFRAN ) IV, oxyCODONE  Allergies:    Allergies  Allergen Reactions   Flexeril [Cyclobenzaprine] Other (See Comments)    Blacks out    Social History:   Social History   Tobacco Use   Smoking status: Never    Passive exposure: Never   Smokeless tobacco: Never  Vaping Use   Vaping status: Never Used  Substance Use Topics   Alcohol use: Never   Drug use: Never     Family History:   History reviewed. No pertinent family history.   ROS:  Please see the history of present illness. All other ROS reviewed and negative.     Physical Exam/Data: Vitals:   01/24/24 1850 01/25/24 0408 01/25/24 0738 01/25/24 1523  BP: (!) 191/86 (!) 170/76 (!) 158/87 127/68  Pulse: 66 60 63 60  Resp: 16 16 18 16   Temp: 97.8 F (36.6 C) 97.9 F (36.6 C) 97.7 F (36.5 C) 98 F (36.7 C)  TempSrc: Oral Oral Oral Oral  SpO2: 99% 98% 99% 100%    Intake/Output Summary (Last 24 hours) at 01/25/2024 1703 Last data filed at 01/25/2024 1322 Gross per 24 hour  Intake 240 ml  Output 400 ml  Net -160 ml      12/16/2023   12:58 PM 11/09/2023   10:15 AM 11/08/2023   11:34 AM  Last 3 Weights  Weight  (lbs) 177 lb 7.5 oz 175 lb 177 lb 6 oz  Weight (kg) 80.5 kg 79.379 kg 80.457 kg     There is no height or weight on file to calculate BMI.  General: Elderly man lying comfortably in bed.  His son, daughter, and son-in-law are at the bedside. HEENT: normal Neck: no JVD Vascular: No carotid bruits; Distal pulses 2+ bilaterally Cardiac: Irregularly irregular rhythm with 2/6 systolic murmur. Lungs:  clear to auscultation bilaterally, no wheezing, rhonchi or rales  Abd: soft, nontender, no hepatomegaly  Ext: no edema Musculoskeletal:  No deformities, BUE and BLE strength normal and equal Skin: warm and dry  Neuro:  CNs 2-12 intact, no focal abnormalities noted Psych:  Normal affect   EKG: No EKG performed this admission Telemetry:  Telemetry was personally reviewed and  demonstrates: Atrial fibrillation with ventricular rates 55-80 bpm.  Artifact noted.  Relevant CV Studies: None  Laboratory Data: High Sensitivity Troponin:  No results for input(s): TROPONINIHS in the last 720 hours.   Chemistry Recent Labs  Lab 01/24/24 0937 01/25/24 0436 01/25/24 0842  NA 141 142  --   K 3.8 3.4*  --   CL 104 108  --   CO2 26 26  --   GLUCOSE 100* 77  --   BUN 34* 30*  --   CREATININE 1.03 0.85  --   CALCIUM 8.6* 8.2*  --   MG  --   --  2.0  GFRNONAA >60 >60  --   ANIONGAP 11 8  --     Recent Labs  Lab 01/24/24 0937 01/25/24 0436  PROT 6.2* 5.1*  ALBUMIN 3.3* 2.9*  AST 25 23  ALT 17 15  ALKPHOS 31* 25*  BILITOT 2.0* 1.6*   Lipids No results for input(s): CHOL, TRIG, HDL, LABVLDL, LDLCALC, CHOLHDL in the last 168 hours.  Hematology Recent Labs  Lab 01/24/24 0937 01/25/24 0436 01/25/24 1353  WBC 6.7 7.0  --   RBC 4.11* 3.67*  --   HGB 13.1 11.6* 11.7*  HCT 39.7 35.4* 35.9*  MCV 96.6 96.5  --   MCH 31.9 31.6  --   MCHC 33.0 32.8  --   RDW 14.6 14.5  --   PLT 204 189  --    Thyroid  No results for input(s): TSH, FREET4 in the last 168 hours.  BNPNo  results for input(s): BNP, PROBNP in the last 168 hours.  DDimer No results for input(s): DDIMER in the last 168 hours.  Radiology/Studies:  No results found.   Assessment and Plan: Permanent atrial fibrillation and GI bleed: Mr. Dohse presents with recurrent hematochezia and possible melena that began 3 days ago.  He is reminiscent of what he had in May, at which time bleeding stopped spontaneously after aspirin  was held.  He has remained on warfarin with therapeutic INR's.  He continues to note some rectal bleeding today with hemoglobin trending down overnight from 13.1-11.6.  At this point, I think the risks of continued therapeutic anticoagulation outweigh benefits.  We discussed long-term strategies including complete avoidance of anticoagulation, use of low-dose aspirin , or trial of apixaban.  Until his bleeding has stopped and his hemoglobin stabilized, I would be reluctant to add aspirin  or apixaban.  Long-term anticoagulation can be readdressed when he follows up with his outpatient cardiology provider.  Given his advanced age, he is unlikely to be a good candidate for left atrial appendage occlusion as well.  Certainly this would not be feasible until GI bleeding has ceased and upper endoscopy could be performed to exclude significant gastroesophageal pathology, as TEE would be necessary before watchman implantation.  Given that his INR has continued to trend up with ongoing hematochezia, we will administer a one-time dose of vitamin K 2.5 mg p.o. to help correct his INR.  If his bleeding were to worsen significantly, FFP administration may need to be considered.  Coronary artery disease: No angina reported.  Defer warfarin and aspirin  at this time given GI bleeding.  It is reasonable to continue with home dose of simvastatin .  Risk Assessment/Risk Scores: { CHA2DS2-VASc Score = 4   This indicates a 4.8% annual risk of stroke. The patient's score is based upon: CHF History: 0 HTN  History: 1 Diabetes History: 0 Stroke History: 0 Vascular Disease History: 1 Age Score: 2  Gender Score: 0     For questions or updates, please contact [facility] Please consult [email or web address] for contact info under [facility].  Signed, Lonni Hanson, MD  01/25/2024 5:03 PM

## 2024-01-25 NOTE — Progress Notes (Signed)
    Rogelia Copping, MD [facility]   [address] [place], [place] [address] Phone: [phone] Fax : [phone]   Subjective: This patient had come in with rectal bleeding.  He has had a few more bouts of rectal bleeding since admission with the hemoglobin only decreasing slightly.  The patient's INR has gone from 2.8 yesterday to 3.4 today.   Objective: Vital signs in last 24 hours: Vitals:   01/24/24 1625 01/24/24 1850 01/25/24 0408 01/25/24 0738  BP: (!) 160/78 (!) 191/86 (!) 170/76 (!) 158/87  Pulse: 63 66 60 63  Resp: 18 16 16 18   Temp: 98 F (36.7 C) 97.8 F (36.6 C) 97.9 F (36.6 C) 97.7 F (36.5 C)  TempSrc:  Oral Oral Oral  SpO2: 98% 99% 98% 99%   Weight change:   Intake/Output Summary (Last 24 hours) at 01/25/2024 1309 Last data filed at 01/25/2024 0600 Gross per 24 hour  Intake --  Output 400 ml  Net -400 ml     Exam: General: Patient sitting up in bed in no apparent distress alert and oriented x 3   Lab Results: @LABTEST2 @ Micro Results: No results found for this or any previous visit (from the past 240 hours). Studies/Results: No results found. Medications: I have reviewed the patient's current medications. Scheduled Meds:  finasteride   5 mg Oral QPM   gabapentin   100 mg Oral QHS   levothyroxine   75 mcg Oral QAC breakfast   pantoprazole  (PROTONIX ) IV  40 mg Intravenous Q12H   phytonadione   2.5 mg Oral Once   simvastatin   10 mg Oral QHS   Continuous Infusions: PRN Meds:.acetaminophen  **OR** acetaminophen , albuterol , hydrALAZINE  **OR** hydrALAZINE , ondansetron  **OR** ondansetron  (ZOFRAN ) IV, oxyCODONE   Assessment: Principal Problem:   GI bleeding Active Problems:   Chronic atrial fibrillation (HCC)   Coronary atherosclerosis   Hyperlipidemia   Hypertension, benign   Hypothyroidism, unspecified   Rectal bleeding   Long term (current) use of anticoagulants    Plan: This patient has what appears to be rectal bleeding likely from  a diverticular source.  The patient has a negative CT angiography but pan colonic diverticulosis seen.  The patient has elected not to proceed with a colonoscopy at this time and would need to have his INR below 1.5 if we did decide to proceed.  If the patient should have a large amount of bleeding then a red tagged cell scan with possible angiography subsequent to that if the bleeding scan is positive.  Not planning to do any endoscopic procedures at this time.   LOS: 0 days   Rogelia Copping, MD.FACG 01/25/2024, 1:09 PM Pager [phone] 7am-5pm  Check AMION for 5pm -7am coverage and on weekends

## 2024-01-25 NOTE — TOC CM/SW Note (Signed)
 Transition of Care [facility]) - Inpatient Brief Assessment   Patient Details  Name: Carl Coffey MRN: [id number] Date of Birth: 29-Apr-1921  Transition of Care [facility]) CM/SW Contact:    [name], RN Phone Number: 01/25/2024, 12:19 PM   Clinical Narrative:  Transition of Care (TOC) Screening Note   Patient Details  Name: Carl Coffey Date of Birth: 05/29/1921   Transition of Care [facility]) CM/SW Contact:    [name], RN Phone Number: 01/25/2024, 12:19 PM    Transition of Care Department [facility]) has reviewed patient and no TOC needs have been identified at this time. . If new patient transition needs arise, please place a TOC consult.     Transition of Care Asessment: Insurance and Status: Insurance coverage has been reviewed Patient has primary care physician: Yes     Prior/Current Home Services: No current home services Social Drivers of Health Review: SDOH reviewed no interventions necessary Readmission risk has been reviewed: No (obs status.  no score generated) Transition of care needs: no transition of care needs at this time

## 2024-01-26 ENCOUNTER — Other Ambulatory Visit: Payer: Self-pay

## 2024-01-26 DIAGNOSIS — I482 Chronic atrial fibrillation, unspecified: Secondary | ICD-10-CM | POA: Diagnosis not present

## 2024-01-26 DIAGNOSIS — I10 Essential (primary) hypertension: Secondary | ICD-10-CM

## 2024-01-26 DIAGNOSIS — I2510 Atherosclerotic heart disease of native coronary artery without angina pectoris: Secondary | ICD-10-CM | POA: Diagnosis not present

## 2024-01-26 DIAGNOSIS — Z7901 Long term (current) use of anticoagulants: Secondary | ICD-10-CM | POA: Diagnosis not present

## 2024-01-26 DIAGNOSIS — K921 Melena: Secondary | ICD-10-CM

## 2024-01-26 DIAGNOSIS — K922 Gastrointestinal hemorrhage, unspecified: Secondary | ICD-10-CM | POA: Diagnosis not present

## 2024-01-26 DIAGNOSIS — E782 Mixed hyperlipidemia: Secondary | ICD-10-CM

## 2024-01-26 LAB — TYPE AND SCREEN
ABO/RH(D): A POS
Antibody Screen: POSITIVE
Unit division: 0
Unit division: 0

## 2024-01-26 LAB — BASIC METABOLIC PANEL WITH GFR
Anion gap: 8 (ref 5–15)
BUN: 30 mg/dL — ABNORMAL HIGH (ref 8–23)
CO2: 22 mmol/L (ref 22–32)
Calcium: 8.1 mg/dL — ABNORMAL LOW (ref 8.9–10.3)
Chloride: 111 mmol/L (ref 98–111)
Creatinine, Ser: 0.87 mg/dL (ref 0.61–1.24)
GFR, Estimated: >60 mL/min (ref >60)
Glucose, Bld: 91 mg/dL (ref 70–99)
Potassium: 4.3 mmol/L (ref 3.5–5.1)
Sodium: 142 mmol/L (ref 135–145)

## 2024-01-26 LAB — PROTIME-INR
INR: 1.9 — ABNORMAL HIGH (ref 0.8–1.2)
Prothrombin Time: 23.2 s — ABNORMAL HIGH (ref 11.4–15.2)

## 2024-01-26 LAB — CBC
HCT: 32.8 % — ABNORMAL LOW (ref 39.0–52.0)
Hemoglobin: 11 g/dL — ABNORMAL LOW (ref 13.0–17.0)
MCH: 32 pg (ref 26.0–34.0)
MCHC: 33.5 g/dL (ref 30.0–36.0)
MCV: 95.3 fL (ref 80.0–100.0)
Platelets: 179 K/uL (ref 150–400)
RBC: 3.44 MIL/uL — ABNORMAL LOW (ref 4.22–5.81)
RDW: 14.6 % (ref 11.5–15.5)
WBC: 6.3 K/uL (ref 4.0–10.5)
nRBC: 0 % (ref 0.0–0.2)

## 2024-01-26 LAB — BPAM RBC
Blood Product Expiration Date: 202512082359
Blood Product Expiration Date: 202512082359
ISSUE DATE / TIME: 202511100833
Unit Type and Rh: 5100
Unit Type and Rh: 5100

## 2024-01-26 LAB — MAGNESIUM: Magnesium: 2 mg/dL (ref 1.7–2.4)

## 2024-01-26 LAB — PHOSPHORUS: Phosphorus: 2.1 mg/dL — ABNORMAL LOW (ref 2.5–4.6)

## 2024-01-26 MED ORDER — VITAMIN D (ERGOCALCIFEROL) 1.25 MG (50000 UNIT) PO CAPS
50000.0000 [IU] | ORAL_CAPSULE | ORAL | 0 refills | Status: AC
  Filled 2024-01-26: qty 12, 84d supply, fill #0

## 2024-01-26 MED ORDER — K PHOS MONO-SOD PHOS DI & MONO 155-852-130 MG PO TABS
500.0000 mg | ORAL_TABLET | Freq: Four times a day (QID) | ORAL | Status: DC
  Administered 2024-01-26: 500 mg via ORAL
  Filled 2024-01-26 (×3): qty 2

## 2024-01-26 NOTE — Discharge Summary (Signed)
 [facility] Discharge Summary   Patient: Carl Coffey FMW:[id number]  PCP: Lenon Layman ORN, MD  Date of admission: 01/24/2024   Date of discharge:  01/26/2024     Discharge Diagnoses:  Principal Problem:   Acute GI bleeding Active Problems:   Chronic atrial fibrillation (HCC)   Coronary atherosclerosis   Hyperlipidemia   Hypertension, benign   Hypothyroidism, unspecified   Rectal bleeding   Long term (current) use of anticoagulants   Lower GI bleed   Hematochezia   Admitted From: Home Disposition:  Home   Recommendations for Outpatient Follow-up:  Follow-up with PCP in 1 week, repeat CBC after 1 week Follow-up with cardiology in 1 week to discuss anticoagulation option due to A-fib Follow-up with GI for persistent GI bleeding Follow up LABS/TEST:  CBC in 1 wk   Follow-up Information     Lenon Layman ORN, MD Follow up in 1 week(s).   Specialty: Internal Medicine Contact information: [address] [facility] [place] [address] [phone]                Diet recommendation: Regular diet  Activity: The patient is advised to gradually reintroduce usual activities, as tolerated  Discharge Condition: stable  Code Status: Full code   History of present illness: As per the H and P dictated on admission.  Hospital Course:  Carl Coffey is a pleasant 89 y.o. male with medical history significant for atrial fibrillation on chronic warfarin, CAD s/p CABG in 1990s, HTN, hypothyroidism, bladder cancer presented to ED complaining of blood per rectum mixed with fresh blood.  Patient had similar symptoms in May of this year and patient's family chose for conservative management.  Aspirin  and Coumadin  was held at that time and aspirin  was held permanently per note.  Patient was restarted on Coumadin .  Patient denied any other symptoms including abdominal pain, nausea, vomiting, fever, chills, chest pain, shortness of breath,  palpitations.   ED Course: Upon arrival to the ED, patient is found to be reassuring vital signs, normal hemoglobin hematocrit.  Hospitalist service was consulted for evaluation for admission.     Assessment and Plan:   # Lower GI bleeding in the setting of Coumadin  use Discontinued Coumadin  as per cardiology. S/p Protonix  - GI consulted, recommended colonoscopy after 5 days of holding Coumadin  but patient does not want any intervention. -Cardiology consulted, recommended to discontinue Coumadin  for now, follow-up as an outpatient for further management, may need only aspirin  versus Eliquis which will be decided after follow-up appointment.  Vital signs stable. Hb 13.1>11.6>11.7>>11.0 INR 2.8>>3.4>Vit K 2.5 mg>1.9 Patient had 1 BM today with very small amount of blood.  H&H remained stable, patient denied any complaints.  Patient would like to go home and follow-up as an outpatient.  Recommended to follow with PCP to repeat CBC in 1 week.  And follow with cardiology for anticoagulation.   # Hypokalemia, potassium repleted.  Resolved # Hypophosphatemia, Phos repleted.  # CAD s/p CABG/A-fib on Coumadin  Discontinued Coumadin .  Resumed all other home medications.   # HTN/HLD - Resume home medications   #  Hypothyroidism - Continue levothyroxine    # Vitamin D deficiency: started vitamin D 50,000 units p.o. weekly, follow with PCP to repeat vitamin D level after 3 to 6 months.   There is no height or weight on file to calculate BMI.  Nutrition Interventions:   Patient was ambulatory without any assistance.  On the day of the discharge the patient's  vitals were stable, and no other acute medical condition were reported by patient. the patient was felt safe to be discharge at Home.  Consultants: GI and Cardiology Procedures: None  Discharge Exam: General: Appear in no distress, Oral Mucosa Clear, moist. Cardiovascular: S1 and S2 Present, no Murmur, Respiratory: normal respiratory  effort, Bilateral Air entry present and no Crackles, no wheezes Abdomen: Bowel Sound present, Soft and no tenderness. Extremities: no Pedal edema, no calf tenderness Neurology: alert and oriented to time, place, and person affect appropriate.  There were no vitals filed for this visit. Vitals:   01/26/24 0415 01/26/24 0820  BP: 128/75 (!) 155/90  Pulse: (!) 53 75  Resp: 18 18  Temp: 98.2 F (36.8 C) 97.6 F (36.4 C)  SpO2: 97% 97%    DISCHARGE MEDICATION: Allergies as of 01/26/2024       Reactions   Flexeril [cyclobenzaprine] Other (See Comments)   Blacks out        Medication List     STOP taking these medications    cefdinir 300 MG capsule Commonly known as: OMNICEF   predniSONE  10 MG tablet Commonly known as: DELTASONE    warfarin 2.5 MG tablet Commonly known as: COUMADIN        TAKE these medications    acetaminophen  500 MG tablet Commonly known as: TYLENOL  Take 500-1,000 mg by mouth every 6 (six) hours as needed (pain. (lower back/lower leg pain)).   albuterol  108 (90 Base) MCG/ACT inhaler Commonly known as: VENTOLIN  HFA Inhale 2 puffs into the lungs every 4 (four) hours as needed.   cyanocobalamin  1000 MCG tablet Commonly known as: VITAMIN B12 Take 1,000 mcg by mouth in the morning.   finasteride  5 MG tablet Commonly known as: PROSCAR  Take 5 mg by mouth every evening.   gabapentin  100 MG capsule Commonly known as: NEURONTIN  Take 100 mg by mouth at bedtime.   ketoconazole  2 % shampoo Commonly known as: NIZORAL  USE AS A SHAMPOO ON SCALP 3 TIMES WEEKLY. LET SIT FOR 5 MINUTES BEFORE RINSING.   levothyroxine  75 MCG tablet Commonly known as: SYNTHROID  Take 75 mcg by mouth daily before breakfast.   simvastatin  10 MG tablet Commonly known as: ZOCOR  Take 10 mg by mouth at bedtime.   Vitamin D (Ergocalciferol) 1.25 MG (50000 UNIT) Caps capsule Commonly known as: DRISDOL Take 1 capsule (50,000 Units total) by mouth every 7 (seven) days. Start  taking on: February 01, 2024       Allergies  Allergen Reactions   Flexeril [Cyclobenzaprine] Other (See Comments)    Blacks out   Discharge Instructions     Call MD for:   Complete by: As directed    Recurrent GI bleeding   Call MD for:  difficulty breathing, headache or visual disturbances   Complete by: As directed    Call MD for:  extreme fatigue   Complete by: As directed    Call MD for:  persistant dizziness or light-headedness   Complete by: As directed    Call MD for:  persistant nausea and vomiting   Complete by: As directed    Call MD for:  severe uncontrolled pain   Complete by: As directed    Call MD for:  temperature >100.4   Complete by: As directed    Diet general   Complete by: As directed    Discharge instructions   Complete by: As directed    Follow-up with PCP in 1 week, repeat CBC after 1 week Follow-up with cardiology in  1 week to discuss anticoagulation option due to A-fib Follow-up with GI for persistent GI bleeding   Increase activity slowly   Complete by: As directed        The results of significant diagnostics from this hospitalization (including imaging, microbiology, ancillary and laboratory) are listed below for reference.    Significant Diagnostic Studies: No results found.  Microbiology: No results found for this or any previous visit (from the past 240 hours).   Labs: CBC: Recent Labs  Lab 01/24/24 0937 01/25/24 0436 01/25/24 1353 01/25/24 1948 01/26/24 0410  WBC 6.7 7.0  --   --  6.3  HGB 13.1 11.6* 11.7* 11.5* 11.0*  HCT 39.7 35.4* 35.9* 34.3* 32.8*  MCV 96.6 96.5  --   --  95.3  PLT 204 189  --   --  179   Basic Metabolic Panel: Recent Labs  Lab 01/24/24 0937 01/25/24 0436 01/25/24 0842 01/26/24 0410  NA 141 142  --  142  K 3.8 3.4*  --  4.3  CL 104 108  --  111  CO2 26 26  --  22  GLUCOSE 100* 77  --  91  BUN 34* 30*  --  30*  CREATININE 1.03 0.85  --  0.87  CALCIUM 8.6* 8.2*  --  8.1*  MG  --   --  2.0  2.0  PHOS  --   --  2.2* 2.1*   Liver Function Tests: Recent Labs  Lab 01/24/24 0937 01/25/24 0436  AST 25 23  ALT 17 15  ALKPHOS 31* 25*  BILITOT 2.0* 1.6*  PROT 6.2* 5.1*  ALBUMIN 3.3* 2.9*   No results for input(s): LIPASE, AMYLASE in the last 168 hours. No results for input(s): AMMONIA in the last 168 hours. Cardiac Enzymes: No results for input(s): CKTOTAL, CKMB, CKMBINDEX, TROPONINI in the last 168 hours. BNP (last 3 results) No results for input(s): BNP in the last 8760 hours. CBG: No results for input(s): GLUCAP in the last 168 hours.  Time spent: 35 minutes  Signed:  Elvan Sor  [facility] 01/26/2024 12:35 PM

## 2024-01-26 NOTE — Progress Notes (Signed)
 Discharge instructions given to patient, questions answered. IV removed without complications. Patient transporting home via car by his family.

## 2024-01-26 NOTE — Progress Notes (Signed)
    Rogelia Copping, MD [facility]   [address] [place], [place] [address] Phone: [phone] Fax : [phone]   Subjective: This patient has had a passage of a small amount of old blood from his rectum this morning and last night.  He denies any large bloody bowel movements or bright red blood per rectum.  He also denies any abdominal pain.  His hemoglobin has remained above 11.  The patient's INR is pending for today.   Objective: Vital signs in last 24 hours: Vitals:   01/25/24 1523 01/25/24 1938 01/26/24 0415 01/26/24 0820  BP: 127/68 (!) 149/65 128/75 (!) 155/90  Pulse: 60 64 (!) 53 75  Resp: 16 18 18 18   Temp: 98 F (36.7 C) 97.8 F (36.6 C) 98.2 F (36.8 C) 97.6 F (36.4 C)  TempSrc: Oral Oral Oral Oral  SpO2: 100% 98% 97% 97%   Weight change:   Intake/Output Summary (Last 24 hours) at 01/26/2024 1023 Last data filed at 01/26/2024 0900 Gross per 24 hour  Intake 558 ml  Output 225 ml  Net 333 ml     Exam: General: Patient is coming out of the bathroom when I came to see him today and was alert and orientated and walking with a walker   Lab Results: @LABTEST2 @ Micro Results: No results found for this or any previous visit (from the past 240 hours). Studies/Results: No results found. Medications: I have reviewed the patient's current medications. Scheduled Meds:  finasteride   5 mg Oral QPM   gabapentin   100 mg Oral QHS   levothyroxine   75 mcg Oral QAC breakfast   pantoprazole  (PROTONIX ) IV  40 mg Intravenous Q12H   phosphorus  500 mg Oral QID   simvastatin   10 mg Oral QHS   Vitamin D (Ergocalciferol)  50,000 Units Oral Q7 days   Continuous Infusions: PRN Meds:.acetaminophen  **OR** acetaminophen , albuterol , hydrALAZINE  **OR** hydrALAZINE , ondansetron  **OR** ondansetron  (ZOFRAN ) IV, oxyCODONE   Assessment: Principal Problem:   GI bleeding Active Problems:   Chronic atrial fibrillation (HCC)   Coronary atherosclerosis   Hyperlipidemia    Hypertension, benign   Hypothyroidism, unspecified   Rectal bleeding   Long term (current) use of anticoagulants   Lower GI bleed    Plan: The patient has had some old blood per rectum overnight but did not small amount.  His hemoglobin is still above 11 although slightly lower than it was yesterday.  The patient's INR is pending.  If the patient's INR continues to come down and he stays off of his Coumadin  the patients bleeding will likely cease.  Due to his advanced age I am not endorsing a colonoscopy at this time unless he has significant bleeding or a significant drop in his hemoglobin.  The patient and his family have been explained the plan and agree with it.   LOS: 0 days   Rogelia Copping, MD.FACG 01/26/2024, 10:23 AM Pager [phone] 7am-5pm  Check AMION for 5pm -7am coverage and on weekends

## 2024-01-26 NOTE — Plan of Care (Signed)

## 2024-01-26 NOTE — Progress Notes (Signed)
 Rounding Note   Patient Name: Carl Coffey Date of Encounter: 01/26/2024  [facility] Cardiologist: Evalene Lunger, MD   Subjective Seen on AM rounds. Denies any chest pain or shortness of breath. Continues to have small amount of blood noted in his stool. Family remains at the bedside.   Scheduled Meds:  finasteride   5 mg Oral QPM   gabapentin   100 mg Oral QHS   levothyroxine   75 mcg Oral QAC breakfast   pantoprazole  (PROTONIX ) IV  40 mg Intravenous Q12H   phosphorus  500 mg Oral QID   simvastatin   10 mg Oral QHS   Vitamin D (Ergocalciferol)  50,000 Units Oral Q7 days   Continuous Infusions:  PRN Meds: acetaminophen  **OR** acetaminophen , albuterol , hydrALAZINE  **OR** hydrALAZINE , ondansetron  **OR** ondansetron  (ZOFRAN ) IV, oxyCODONE   Vital Signs  Vitals:   01/25/24 1523 01/25/24 1938 01/26/24 0415 01/26/24 0820  BP: 127/68 (!) 149/65 128/75 (!) 155/90  Pulse: 60 64 (!) 53 75  Resp: 16 18 18 18   Temp: 98 F (36.7 C) 97.8 F (36.6 C) 98.2 F (36.8 C) 97.6 F (36.4 C)  TempSrc: Oral Oral Oral Oral  SpO2: 100% 98% 97% 97%    Intake/Output Summary (Last 24 hours) at 01/26/2024 1020 Last data filed at 01/26/2024 0900 Gross per 24 hour  Intake 558 ml  Output 225 ml  Net 333 ml      12/16/2023   12:58 PM 11/09/2023   10:15 AM 11/08/2023   11:34 AM  Last 3 Weights  Weight (lbs) 177 lb 7.5 oz 175 lb 177 lb 6 oz  Weight (kg) 80.5 kg 79.379 kg 80.457 kg      Telemetry Rate controlled atrial fibrillation - Personally Reviewed  ECG  No new tracings - Personally Reviewed  Physical Exam  GEN: No acute distress.   Neck: No JVD Cardiac: IR IR, II/VI systolic murmur, without rubs or gallops.  Respiratory: Clear to auscultation bilaterally. GI: Soft, nontender, non-distended  MS: No edema; No deformity. Neuro:  Nonfocal  Psych: Normal affect   Labs High Sensitivity Troponin:  No results for input(s): TROPONINIHS in the last 720 hours.    Chemistry Recent Labs  Lab 01/24/24 [phone] 01/25/24 0436 01/25/24 0842 01/26/24 0410  NA 141 142  --  142  K 3.8 3.4*  --  4.3  CL 104 108  --  111  CO2 26 26  --  22  GLUCOSE 100* 77  --  91  BUN 34* 30*  --  30*  CREATININE 1.03 0.85  --  0.87  CALCIUM 8.6* 8.2*  --  8.1*  MG  --   --  2.0 2.0  PROT 6.2* 5.1*  --   --   ALBUMIN 3.3* 2.9*  --   --   AST 25 23  --   --   ALT 17 15  --   --   ALKPHOS 31* 25*  --   --   BILITOT 2.0* 1.6*  --   --   GFRNONAA >60 >60  --  >60  ANIONGAP 11 8  --  8    Lipids No results for input(s): CHOL, TRIG, HDL, LABVLDL, LDLCALC, CHOLHDL in the last 168 hours.  Hematology Recent Labs  Lab 01/24/24 0937 01/25/24 0436 01/25/24 1353 01/25/24 1948 01/26/24 0410  WBC 6.7 7.0  --   --  6.3  RBC 4.11* 3.67*  --   --  3.44*  HGB 13.1 11.6* 11.7* 11.5* 11.0*  HCT  39.7 35.4* 35.9* 34.3* 32.8*  MCV 96.6 96.5  --   --  95.3  MCH 31.9 31.6  --   --  32.0  MCHC 33.0 32.8  --   --  33.5  RDW 14.6 14.5  --   --  14.6  PLT 204 189  --   --  179   Thyroid  No results for input(s): TSH, FREET4 in the last 168 hours.  BNPNo results for input(s): BNP, PROBNP in the last 168 hours.  DDimer No results for input(s): DDIMER in the last 168 hours.   Radiology  No results found.  Patient Profile   89 y.o. male with a history of permanent atrial fibrillation on warfarin complicated by GI bleeding, coronary artery disease s/p CABG (1996), hypertension, hyperlipidemia, bladder cancer, and hypothyroidism, who is being seen and evaluated for rectal bleeding in the setting of chronic anticoagulation.  Assessment & Plan  Permanent atrial fibrillation -Continues to be in rate controlled atrial fibrillation on telemetry monitoring - Currently all anticoagulation remains on hold until GI bleeding has ceased -INR for this morning pending -His INR had trended up yesterday and he received 1 dose of vitamin K 2.5 mg to help with  correction -Continue with telemetry monitoring  GI bleed -Hemoglobin 11 this morning -Slightly trending down - Evaluated by GI -GI without rectal bleeding was likely from diverticular source -Negative CT with pancolonic diverticulosis same -Patient has elected not to proceed with colonoscopy and he previously declined EGD -No further endoscopic procedures were planned by GI at this time -Small amount of blood noted in stool per patient this morning -Daily CBC  Coronary artery disease - Denies any chest pain -Continue on simvastatin  -Would defer warfarin and aspirin  at this time given GI bleeding -EKG as needed for pain or changes   For questions or updates, please contact  [facility] Please consult [email or web address] for contact info under       Signed, Nicolette Gieske, NP  01/26/2024, 10:20 AM

## 2024-01-28 ENCOUNTER — Telehealth: Payer: Self-pay | Admitting: Cardiovascular Disease

## 2024-01-28 NOTE — Telephone Encounter (Signed)
 Patient just got discharged from the hospital. He had acute GI bleeding and would like to know if Dr. [name] thinks any medications need to be changed. Please advise.

## 2024-01-28 NOTE — Telephone Encounter (Signed)
 Spoke with the patient, who was admitted to the hospital on 01/24/24 for a GI bleed. Coumadin  was discontinued during hospitalization. The patient reports the last episode of bleeding occurred on 01/25/24. He states that it was discussed that he would begin Aspirin  81 mg daily. He is calling to ask when he should start the Aspirin  and whether any additional lab work is needed.

## 2024-01-31 NOTE — Telephone Encounter (Signed)
 Called patient and left detailed message per DPR. Left message with the following from Dr. [name].  He can start aspirin  81 daily, no additional lab work needed Thx TGollan  Asked patient to call back with any questions.

## 2024-02-01 DIAGNOSIS — K922 Gastrointestinal hemorrhage, unspecified: Secondary | ICD-10-CM | POA: Diagnosis not present

## 2024-02-01 DIAGNOSIS — Z09 Encounter for follow-up examination after completed treatment for conditions other than malignant neoplasm: Secondary | ICD-10-CM | POA: Diagnosis not present

## 2024-02-01 DIAGNOSIS — M545 Low back pain, unspecified: Secondary | ICD-10-CM | POA: Diagnosis not present

## 2024-02-01 DIAGNOSIS — G8929 Other chronic pain: Secondary | ICD-10-CM | POA: Diagnosis not present

## 2024-03-01 ENCOUNTER — Ambulatory Visit

## 2024-03-22 ENCOUNTER — Encounter: Payer: Self-pay | Admitting: Dermatology

## 2024-03-22 ENCOUNTER — Ambulatory Visit: Payer: PPO | Admitting: Dermatology

## 2024-03-22 DIAGNOSIS — L821 Other seborrheic keratosis: Secondary | ICD-10-CM

## 2024-03-22 DIAGNOSIS — W908XXA Exposure to other nonionizing radiation, initial encounter: Secondary | ICD-10-CM | POA: Diagnosis not present

## 2024-03-22 DIAGNOSIS — Z8589 Personal history of malignant neoplasm of other organs and systems: Secondary | ICD-10-CM

## 2024-03-22 DIAGNOSIS — Z85828 Personal history of other malignant neoplasm of skin: Secondary | ICD-10-CM

## 2024-03-22 DIAGNOSIS — L578 Other skin changes due to chronic exposure to nonionizing radiation: Secondary | ICD-10-CM

## 2024-03-22 DIAGNOSIS — D1801 Hemangioma of skin and subcutaneous tissue: Secondary | ICD-10-CM | POA: Diagnosis not present

## 2024-03-22 DIAGNOSIS — Z1283 Encounter for screening for malignant neoplasm of skin: Secondary | ICD-10-CM | POA: Diagnosis not present

## 2024-03-22 DIAGNOSIS — Z86018 Personal history of other benign neoplasm: Secondary | ICD-10-CM

## 2024-03-22 DIAGNOSIS — L814 Other melanin hyperpigmentation: Secondary | ICD-10-CM | POA: Diagnosis not present

## 2024-03-22 DIAGNOSIS — L570 Actinic keratosis: Secondary | ICD-10-CM

## 2024-03-22 DIAGNOSIS — D229 Melanocytic nevi, unspecified: Secondary | ICD-10-CM

## 2024-03-22 DIAGNOSIS — D692 Other nonthrombocytopenic purpura: Secondary | ICD-10-CM | POA: Diagnosis not present

## 2024-03-22 DIAGNOSIS — L820 Inflamed seborrheic keratosis: Secondary | ICD-10-CM

## 2024-03-22 NOTE — Patient Instructions (Signed)

## 2024-03-22 NOTE — Progress Notes (Signed)
 "  Follow-Up Visit   Subjective  Carl Coffey is a 89 y.o. male who presents for the following: Skin Cancer Screening and Upper Body Skin Exam, hx of SCC, hx of BCC, hx of Aks   The patient presents for Upper Body Skin Exam (UBSE) for skin cancer screening and mole check. The patient has spots, moles and lesions to be evaluated, some may be new or changing and the patient may have concern these could be cancer.  The following portions of the chart were reviewed this encounter and updated as appropriate: medications, allergies, medical history  Review of Systems:  No other skin or systemic complaints except as noted in HPI or Assessment and Plan.  Objective  Well appearing patient in no apparent distress; mood and affect are within normal limits.  All skin waist up examined. Relevant physical exam findings are noted in the Assessment and Plan.  face x12 (12) Erythematous thin papules/macules with gritty scale.  arms and shoulders x21 (21) Erythematous keratotic or waxy stuck-on papule or plaque.  Assessment & Plan  AK (ACTINIC KERATOSIS) (12) face x12 (12) Actinic keratoses are precancerous spots that appear secondary to cumulative UV radiation exposure/sun exposure over time. They are chronic with expected duration over 1 year. A portion of actinic keratoses will progress to squamous cell carcinoma of the skin. It is not possible to reliably predict which spots will progress to skin cancer and so treatment is recommended to prevent development of skin cancer.  Recommend daily broad spectrum sunscreen SPF 30+ to sun-exposed areas, reapply every 2 hours as needed.  Recommend staying in the shade or wearing long sleeves, sun glasses (UVA+UVB protection) and wide brim hats (4-inch brim around the entire circumference of the hat). Call for new or changing lesions. - Destruction of lesion - face x12 (12) Complexity: simple   Destruction method: cryotherapy   Informed consent: discussed  and consent obtained   Timeout:  patient name, date of birth, surgical site, and procedure verified Lesion destroyed using liquid nitrogen: Yes   Region frozen until ice ball extended beyond lesion: Yes   Outcome: patient tolerated procedure well with no complications   Post-procedure details: wound care instructions given   Additional details:  Prior to procedure, discussed risks of blister formation, small wound, skin dyspigmentation, or rare scar following cryotherapy. Recommend Vaseline ointment to treated areas while healing.   INFLAMED SEBORRHEIC KERATOSIS (21) arms and shoulders x21 (21) Symptomatic, irritating, patient would like treated. - Destruction of lesion - arms and shoulders x21 (21) Complexity: simple   Destruction method: cryotherapy   Informed consent: discussed and consent obtained   Timeout:  patient name, date of birth, surgical site, and procedure verified Lesion destroyed using liquid nitrogen: Yes   Region frozen until ice ball extended beyond lesion: Yes   Outcome: patient tolerated procedure well with no complications   Post-procedure details: wound care instructions given   Additional details:  Prior to procedure, discussed risks of blister formation, small wound, skin dyspigmentation, or rare scar following cryotherapy. Recommend Vaseline ointment to treated areas while healing.    Skin cancer screening performed today.  Actinic Damage - Chronic condition, secondary to cumulative UV/sun exposure - diffuse scaly erythematous macules with underlying dyspigmentation - Recommend daily broad spectrum sunscreen SPF 30+ to sun-exposed areas, reapply every 2 hours as needed.  - Staying in the shade or wearing long sleeves, sun glasses (UVA+UVB protection) and wide brim hats (4-inch brim around the entire circumference of the hat)  are also recommended for sun protection.  - Call for new or changing lesions.  Lentigines, Seborrheic Keratoses, Hemangiomas - Benign  normal skin lesions - Benign-appearing - Call for any changes  Melanocytic Nevi - Tan-brown and/or pink-flesh-colored symmetric macules and papules - Benign appearing on exam today - Observation - Call clinic for new or changing moles - Recommend daily use of broad spectrum spf 30+ sunscreen to sun-exposed areas.   HISTORY OF DYSPLASTIC NEVUS No evidence of recurrence today Recommend regular full body skin exams Recommend daily broad spectrum sunscreen SPF 30+ to sun-exposed areas, reapply every 2 hours as needed.  Call if any new or changing lesions are noted between office visits    HISTORY OF BASAL CELL CARCINOMA OF THE SKIN - No evidence of recurrence today - Recommend regular full body skin exams - Recommend daily broad spectrum sunscreen SPF 30+ to sun-exposed areas, reapply every 2 hours as needed.  - Call if any new or changing lesions are noted between office visits    HISTORY OF SQUAMOUS CELL CARCINOMA OF THE SKIN - No evidence of recurrence today - No lymphadenopathy - Recommend regular full body skin exams - Recommend daily broad spectrum sunscreen SPF 30+ to sun-exposed areas, reapply every 2 hours as needed.  - Call if any new or changing lesions are noted between office visits   Purpura - Chronic; persistent and recurrent.  Treatable, but not curable. - Violaceous macules and patches at arms - Benign - Related to trauma, age, sun damage and/or use of blood thinners, chronic use of topical and/or oral steroids - Observe - Can use OTC arnica containing moisturizer such as Dermend Bruise Formula if desired - Call for worsening or other concerns   Return in about 1 year (around 03/22/2025) for UBSE, HxSCC, HxBCC, HxDN.  I, Jill Parcell, CMA, am acting as scribe for Alm Rhyme, MD.   Documentation: I have reviewed the above documentation for accuracy and completeness, and I agree with the above.  Alm Rhyme, MD    "

## 2024-04-25 ENCOUNTER — Other Ambulatory Visit: Admitting: Urology

## 2025-03-22 ENCOUNTER — Ambulatory Visit: Admitting: Dermatology
